# Patient Record
Sex: Female | Born: 1945 | Race: Black or African American | Hispanic: No | Smoking: Never smoker
Health system: Southern US, Community
[De-identification: ages and names within clinical notes are randomized; demographics above are authoritative.]

## PROBLEM LIST (undated history)

## (undated) DIAGNOSIS — K08109 Complete loss of teeth, unspecified cause, unspecified class: Secondary | ICD-10-CM

## (undated) DIAGNOSIS — Z973 Presence of spectacles and contact lenses: Secondary | ICD-10-CM

## (undated) DIAGNOSIS — R519 Headache, unspecified: Secondary | ICD-10-CM

## (undated) DIAGNOSIS — H409 Unspecified glaucoma: Secondary | ICD-10-CM

## (undated) DIAGNOSIS — M1990 Unspecified osteoarthritis, unspecified site: Secondary | ICD-10-CM

## (undated) DIAGNOSIS — Z9289 Personal history of other medical treatment: Secondary | ICD-10-CM

## (undated) DIAGNOSIS — I10 Essential (primary) hypertension: Secondary | ICD-10-CM

## (undated) DIAGNOSIS — G959 Disease of spinal cord, unspecified: Secondary | ICD-10-CM

## (undated) DIAGNOSIS — Z972 Presence of dental prosthetic device (complete) (partial): Secondary | ICD-10-CM

## (undated) DIAGNOSIS — D649 Anemia, unspecified: Secondary | ICD-10-CM

## (undated) DIAGNOSIS — D75839 Thrombocytosis, unspecified: Secondary | ICD-10-CM

## (undated) DIAGNOSIS — M5441 Lumbago with sciatica, right side: Secondary | ICD-10-CM

## (undated) DIAGNOSIS — K5792 Diverticulitis of intestine, part unspecified, without perforation or abscess without bleeding: Secondary | ICD-10-CM

## (undated) DIAGNOSIS — K922 Gastrointestinal hemorrhage, unspecified: Secondary | ICD-10-CM

## (undated) DIAGNOSIS — D751 Secondary polycythemia: Secondary | ICD-10-CM

## (undated) DIAGNOSIS — D473 Essential (hemorrhagic) thrombocythemia: Secondary | ICD-10-CM

## (undated) HISTORY — PX: TUBAL LIGATION: SHX77

## (undated) HISTORY — PX: CATARACT EXTRACTION, BILATERAL: SHX1313

## (undated) HISTORY — PX: ABDOMINAL HYSTERECTOMY: SHX81

## (undated) HISTORY — PX: FRACTURE SURGERY: SHX138

---
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
## 2011-08-10 ENCOUNTER — Encounter (INDEPENDENT_AMBULATORY_CARE_PROVIDER_SITE_OTHER): Payer: Self-pay | Admitting: Hematology and Oncology

## 2011-08-10 DIAGNOSIS — D45 Polycythemia vera: Secondary | ICD-10-CM

## 2011-08-10 DIAGNOSIS — K922 Gastrointestinal hemorrhage, unspecified: Secondary | ICD-10-CM

## 2011-09-02 ENCOUNTER — Encounter (INDEPENDENT_AMBULATORY_CARE_PROVIDER_SITE_OTHER): Payer: 59 | Admitting: Hematology and Oncology

## 2011-09-02 DIAGNOSIS — D45 Polycythemia vera: Secondary | ICD-10-CM

## 2011-09-02 DIAGNOSIS — D649 Anemia, unspecified: Secondary | ICD-10-CM

## 2011-09-02 DIAGNOSIS — K5730 Diverticulosis of large intestine without perforation or abscess without bleeding: Secondary | ICD-10-CM

## 2011-09-30 ENCOUNTER — Encounter (INDEPENDENT_AMBULATORY_CARE_PROVIDER_SITE_OTHER): Payer: 59 | Admitting: Hematology and Oncology

## 2011-09-30 DIAGNOSIS — I10 Essential (primary) hypertension: Secondary | ICD-10-CM

## 2011-09-30 DIAGNOSIS — D473 Essential (hemorrhagic) thrombocythemia: Secondary | ICD-10-CM

## 2011-09-30 DIAGNOSIS — D45 Polycythemia vera: Secondary | ICD-10-CM

## 2011-11-01 DIAGNOSIS — I959 Hypotension, unspecified: Secondary | ICD-10-CM

## 2011-11-01 DIAGNOSIS — K922 Gastrointestinal hemorrhage, unspecified: Secondary | ICD-10-CM

## 2011-11-23 ENCOUNTER — Encounter (INDEPENDENT_AMBULATORY_CARE_PROVIDER_SITE_OTHER): Payer: 59 | Admitting: Hematology and Oncology

## 2011-11-23 DIAGNOSIS — D696 Thrombocytopenia, unspecified: Secondary | ICD-10-CM

## 2012-07-27 DIAGNOSIS — K922 Gastrointestinal hemorrhage, unspecified: Secondary | ICD-10-CM

## 2012-07-27 DIAGNOSIS — I10 Essential (primary) hypertension: Secondary | ICD-10-CM

## 2012-07-27 DIAGNOSIS — D45 Polycythemia vera: Secondary | ICD-10-CM

## 2012-07-27 DIAGNOSIS — D473 Essential (hemorrhagic) thrombocythemia: Secondary | ICD-10-CM

## 2012-08-31 DIAGNOSIS — D473 Essential (hemorrhagic) thrombocythemia: Secondary | ICD-10-CM

## 2012-08-31 DIAGNOSIS — D45 Polycythemia vera: Secondary | ICD-10-CM

## 2012-08-31 DIAGNOSIS — I10 Essential (primary) hypertension: Secondary | ICD-10-CM

## 2013-07-18 ENCOUNTER — Encounter (HOSPITAL_COMMUNITY): Payer: Self-pay | Admitting: Pharmacy Technician

## 2013-07-26 NOTE — Patient Instructions (Signed)
Gabrielle Donovan  07/26/2013   Your procedure is scheduled on:  07/31/2013  0935am-1045am  Report to [facility] at    [phone]  AM.  Call this number if you have problems the morning of surgery: [phone]   Remember:   Do not eat food or drink liquids after midnight.   Take these medicines the morning of surgery with A SIP OF WATER:    Do not wear jewelry, make-up or nail polish.  Do not wear lotions, powders, or perfumes.   Do not shave 48 hours prior to surgery. .  Do not bring valuables to the hospital.  Contacts, dentures or bridgework may not be worn into surgery.  Leave suitcase in the car. After surgery it may be brought to your room.  For patients admitted to the hospital, checkout time is 11:00 AM the day of  discharge.   Gabrielle Donovan - Preparing for Surgery Before surgery, you can play an important role.  Because skin is not sterile, your skin needs to be as free of germs as possible.  You can reduce the number of germs on your skin by washing with CHG (chlorahexidine gluconate) soap before surgery.  CHG is an antiseptic cleaner which kills germs and bonds with the skin to continue killing germs even after washing. Please DO NOT use if you have an allergy to CHG or antibacterial soaps.  If your skin becomes reddened/irritated stop using the CHG and inform your nurse when you arrive at Short Stay. Do not shave (including legs and underarms) for at least 48 hours prior to the first CHG shower.  You may shave your face. Please follow these instructions carefully:  1.  Shower with CHG Soap the night before surgery and the  morning of Surgery.  2.  If you choose to wash your hair, wash your hair first as usual with your  normal  shampoo.  3.  After you shampoo, rinse your hair and body thoroughly to remove the  shampoo.                           4.  Use CHG as you would any other liquid soap.  You can apply chg directly  to the skin and wash                       Gently with  a scrungie or clean washcloth.  5.  Apply the CHG Soap to your body ONLY FROM THE NECK DOWN.   Do not use on open                           Wound or open sores. Avoid contact with eyes, ears mouth and genitals (private parts).                        Genitals (private parts) with your normal soap.             6.  Wash thoroughly, paying special attention to the area where your surgery  will be performed.  7.  Thoroughly rinse your body with warm water from the neck down.  8.  DO NOT shower/wash with your normal soap after using and rinsing off  the CHG Soap.                9.  Pat yourself dry with  a clean towel.            10.  Wear clean pajamas.            11.  Place clean sheets on your bed the night of your first shower and do not  sleep with pets. Day of Surgery : Do not apply any lotions/deodorants the morning of surgery.  Please wear clean clothes to the hospital/surgery center.  FAILURE TO FOLLOW THESE INSTRUCTIONS MAY RESULT IN THE CANCELLATION OF YOUR SURGERY PATIENT SIGNATURE_________________________________  NURSE SIGNATURE__________________________________  ________________________________________________________________________  WHAT IS A BLOOD TRANSFUSION? Blood Transfusion Information  A transfusion is the replacement of blood or some of its parts. Blood is made up of multiple cells which provide different functions.  Red blood cells carry oxygen and are used for blood loss replacement.  White blood cells fight against infection.  Platelets control bleeding.  Plasma helps clot blood.  Other blood products are available for specialized needs, such as hemophilia or other clotting disorders. BEFORE THE TRANSFUSION  Who gives blood for transfusions?   Healthy volunteers who are fully evaluated to make sure their blood is safe. This is blood bank blood. Transfusion therapy is the safest it has ever been in the practice of medicine. Before blood is taken from a donor, a  complete history is taken to make sure that person has no history of diseases nor engages in risky social behavior (examples are intravenous drug use or sexual activity with multiple partners). The donor's travel history is screened to minimize risk of transmitting infections, such as malaria. The donated blood is tested for signs of infectious diseases, such as HIV and hepatitis. The blood is then tested to be sure it is compatible with you in order to minimize the chance of a transfusion reaction. If you or a relative donates blood, this is often done in anticipation of surgery and is not appropriate for emergency situations. It takes many days to process the donated blood. RISKS AND COMPLICATIONS Although transfusion therapy is very safe and saves many lives, the main dangers of transfusion include:   Getting an infectious disease.  Developing a transfusion reaction. This is an allergic reaction to something in the blood you were given. Every precaution is taken to prevent this. The decision to have a blood transfusion has been considered carefully by your caregiver before blood is given. Blood is not given unless the benefits outweigh the risks. AFTER THE TRANSFUSION  Right after receiving a blood transfusion, you will usually feel much better and more energetic. This is especially true if your red blood cells have gotten low (anemic). The transfusion raises the level of the red blood cells which carry oxygen, and this usually causes an energy increase.  The nurse administering the transfusion will monitor you carefully for complications. HOME CARE INSTRUCTIONS  No special instructions are needed after a transfusion. You may find your energy is better. Speak with your caregiver about any limitations on activity for underlying diseases you may have. SEEK MEDICAL CARE IF:   Your condition is not improving after your transfusion.  You develop redness or irritation at the intravenous (IV)  site. SEEK IMMEDIATE MEDICAL CARE IF:  Any of the following symptoms occur over the next 12 hours:  Shaking chills.  You have a temperature by mouth above 102 F (38.9 C), not controlled by medicine.  Chest, back, or muscle pain.  People around you feel you are not acting correctly or are confused.  Shortness of breath  or difficulty breathing.  Dizziness and fainting.  You get a rash or develop hives.  You have a decrease in urine output.  Your urine turns a dark color or changes to pink, red, or brown. Any of the following symptoms occur over the next 10 days:  You have a temperature by mouth above 102 F (38.9 C), not controlled by medicine.  Shortness of breath.  Weakness after normal activity.  The white part of the eye turns yellow (jaundice).  You have a decrease in the amount of urine or are urinating less often.  Your urine turns a dark color or changes to pink, red, or brown. Document Released: 02/27/2000 Document Revised: 05/24/2011 Document Reviewed: 10/16/2007 Exit[employer] Patient Information 2014 [employer] [place].  _______________________________________________________________________  Incentive Spirometer  An incentive spirometer is a tool that can help keep your lungs clear and active. This tool measures how well you are filling your lungs with each breath. Taking long deep breaths may help reverse or decrease the chance of developing breathing (pulmonary) problems (especially infection) following:  A long period of time when you are unable to move or be active. BEFORE THE PROCEDURE   If the spirometer includes an indicator to show your best effort, your nurse or respiratory therapist will set it to a desired goal.  If possible, sit up straight or lean slightly forward. Try not to slouch.  Hold the incentive spirometer in an upright position. INSTRUCTIONS FOR USE  1. Sit on the edge of your bed if possible, or sit up as far as you can in bed or on a  chair. 2. Hold the incentive spirometer in an upright position. 3. Breathe out normally. 4. Place the mouthpiece in your mouth and seal your lips tightly around it. 5. Breathe in slowly and as deeply as possible, raising the piston or the ball toward the top of the column. 6. Hold your breath for 3-5 seconds or for as long as possible. Allow the piston or ball to fall to the bottom of the column. 7. Remove the mouthpiece from your mouth and breathe out normally. 8. Rest for a few seconds and repeat Steps 1 through 7 at least 10 times every 1-2 hours when you are awake. Take your time and take a few normal breaths between deep breaths. 9. The spirometer may include an indicator to show your best effort. Use the indicator as a goal to work toward during each repetition. 10. After each set of 10 deep breaths, practice coughing to be sure your lungs are clear. If you have an incision (the cut made at the time of surgery), support your incision when coughing by placing a pillow or rolled up towels firmly against it. Once you are able to get out of bed, walk around indoors and cough well. You may stop using the incentive spirometer when instructed by your caregiver.  RISKS AND COMPLICATIONS  Take your time so you do not get dizzy or light-headed.  If you are in pain, you may need to take or ask for pain medication before doing incentive spirometry. It is harder to take a deep breath if you are having pain. AFTER USE  Rest and breathe slowly and easily.  It can be helpful to keep track of a log of your progress. Your caregiver can provide you with a simple table to help with this. If you are using the spirometer at home, follow these instructions: SEEK MEDICAL CARE IF:   You are having difficultly using the  spirometer.  You have trouble using the spirometer as often as instructed.  Your pain medication is not giving enough relief while using the spirometer.  You develop fever of 100.5 F  (38.1 C) or higher. SEEK IMMEDIATE MEDICAL CARE IF:   You cough up bloody sputum that had not been present before.  You develop fever of 102 F (38.9 C) or greater.  You develop worsening pain at or near the incision site. MAKE SURE YOU:   Understand these instructions.  Will watch your condition.  Will get help right away if you are not doing well or get worse. Document Released: 07/12/2006 Document Revised: 05/24/2011 Document Reviewed: 09/12/2006 ExitCare Patient Information 2014 ExitCare, [place].   ________________________________________________________________________    Please read over the following fact sheets that you were given: MRSA Information, coughing and deep breathing exercises, leg exercises

## 2013-07-27 ENCOUNTER — Ambulatory Visit (HOSPITAL_COMMUNITY)
Admission: RE | Admit: 2013-07-27 | Discharge: 2013-07-27 | Disposition: A | Discharge status: Home / Self Care | Payer: PRIVATE HEALTH INSURANCE | Source: Ambulatory Visit | Attending: Orthopedic Surgery | Admitting: Orthopedic Surgery

## 2013-07-27 ENCOUNTER — Encounter (HOSPITAL_COMMUNITY)
Admission: RE | Admit: 2013-07-27 | Discharge: 2013-07-27 | Disposition: A | Discharge status: Home / Self Care | Payer: PRIVATE HEALTH INSURANCE | Source: Ambulatory Visit | Attending: Orthopedic Surgery | Admitting: Orthopedic Surgery

## 2013-07-27 ENCOUNTER — Encounter (HOSPITAL_COMMUNITY): Payer: Self-pay

## 2013-07-27 DIAGNOSIS — Z01812 Encounter for preprocedural laboratory examination: Secondary | ICD-10-CM | POA: Insufficient documentation

## 2013-07-27 DIAGNOSIS — G9589 Other specified diseases of spinal cord: Secondary | ICD-10-CM | POA: Insufficient documentation

## 2013-07-27 DIAGNOSIS — Z01818 Encounter for other preprocedural examination: Secondary | ICD-10-CM | POA: Insufficient documentation

## 2013-07-27 HISTORY — DX: Unspecified osteoarthritis, unspecified site: M19.90

## 2013-07-27 HISTORY — DX: Essential (primary) hypertension: I10

## 2013-07-27 LAB — BASIC METABOLIC PANEL
BUN: 12 mg/dL (ref 6–23)
CO2: 26 meq/L (ref 19–32)
Calcium: 9.9 mg/dL (ref 8.4–10.5)
Chloride: 104 mEq/L (ref 96–112)
Creatinine, Ser: 0.85 mg/dL (ref 0.50–1.10)
GFR calc Af Amer: 79 mL/min — ABNORMAL LOW (ref >90)
GFR, EST NON AFRICAN AMERICAN: 68 mL/min — AB (ref >90)
GLUCOSE: 81 mg/dL (ref 70–99)
POTASSIUM: 4.6 meq/L (ref 3.7–5.3)
Sodium: 141 mEq/L (ref 137–147)

## 2013-07-27 LAB — CBC
HEMATOCRIT: 29.5 % — AB (ref 36.0–46.0)
HEMOGLOBIN: 9.8 g/dL — AB (ref 12.0–15.0)
MCH: 39.4 pg — AB (ref 26.0–34.0)
MCHC: 33.2 g/dL (ref 30.0–36.0)
MCV: 118.5 fL — AB (ref 78.0–100.0)
Platelets: 1323 10*3/uL (ref 150–400)
RBC: 2.49 MIL/uL — ABNORMAL LOW (ref 3.87–5.11)
WBC: 3.3 10*3/uL — ABNORMAL LOW (ref 4.0–10.5)

## 2013-07-27 LAB — URINALYSIS, ROUTINE W REFLEX MICROSCOPIC
BILIRUBIN URINE: NEGATIVE
Glucose, UA: NEGATIVE mg/dL
Hgb urine dipstick: NEGATIVE
Ketones, ur: NEGATIVE mg/dL
LEUKOCYTES UA: NEGATIVE
NITRITE: NEGATIVE
PH: 7 (ref 5.0–8.0)
Protein, ur: NEGATIVE mg/dL
Specific Gravity, Urine: 1.01 (ref 1.005–1.030)
Urobilinogen, UA: 0.2 mg/dL (ref 0.0–1.0)

## 2013-07-27 LAB — SURGICAL PCR SCREEN
MRSA, PCR: NEGATIVE
Staphylococcus aureus: POSITIVE — AB

## 2013-07-27 LAB — ABO/RH: ABO/RH(D): A POS

## 2013-07-27 LAB — PROTIME-INR
INR: 1.12 (ref 0.00–1.49)
PROTHROMBIN TIME: 14.2 s (ref 11.6–15.2)

## 2013-07-27 LAB — APTT: aPTT: 38 seconds — ABNORMAL HIGH (ref 24–37)

## 2013-07-27 NOTE — Progress Notes (Addendum)
Stress  Test  07/20/2013 on chart  Baseline EKG from Stress Test 07/20/13 on chart

## 2013-07-27 NOTE — Progress Notes (Signed)
CBC results faxed via EPIC to Dr Judd Lien office.  Called office .  Office received labs.

## 2013-07-27 NOTE — Progress Notes (Signed)
Faxed via EPIC the CBC results on patient done today at preop visi to Dr Judd Lien.    Critical platelet count. Office received fax and spoke with Craig Staggers , RN and she saw  CBC results.  Craig Staggers stated Dr on call will be notified and Dr Pleas Koch will be made aware of on Monday.

## 2013-07-27 NOTE — Progress Notes (Signed)
CRITICAL VALUE ALERT  Critical value received:  Platelet count 1323  Date of notification:  07/27/2013   Time of notification:  1515pm  Critical value read back: YES   Nurse who received alert:  Gillian Shields RN   MD notified (1st page):  [phone]. Dr Alvan Dame   Time of first page:  1535pm  MD notified (2nd page):[phone] called office back and they stated Dr Alvan Dame had received page   Time of second page:Called office back at 1615pm to page MD on call- Dr Wynelle Link.  Dr Alvan Dame never returned page.    Responding MD:  Dr Wynelle Link returned page at 1630pm  Time MD responded:  1630pm- Dr Wynelle Link aware of platelet count of 1323 and Hemoglobin 9.8.  No new orders given except to notify Dr Alvan Dame on 07/30/13 am and Dr Wynelle Link aware [facility] aware of lab results and they will notify their MD on call.

## 2013-07-29 NOTE — H&P (Signed)
TOTAL HIP ADMISSION H&P  Patient is admitted for right total hip arthroplasty, anterior approach.  Subjective:  Chief Complaint:     Right hip OA / pain  HPI: Gabrielle Donovan, 68 y.o. female, has a history of pain and functional disability in the right hip(s) due to arthritis and patient has failed non-surgical conservative treatments for greater than 12 weeks to include NSAID's and/or analgesics, corticosteriod injections and activity modification.  Onset of symptoms was gradual starting years ago with gradually worsening course since that time.The patient noted no past surgery on the right hip(s).  Patient currently rates pain in the right hip at 10 out of 10 with activity. Patient has night pain, worsening of pain with activity and weight bearing, trendelenberg gait, pain that interfers with activities of daily living and pain with passive range of motion. Patient has evidence of periarticular osteophytes and joint space narrowing by imaging studies. This condition presents safety issues increasing the risk of falls.  There is no current active infection.  Risks, benefits and expectations were discussed with the patient.  Risks including but not limited to the risk of anesthesia, blood clots, nerve damage, blood vessel damage, failure of the prosthesis, infection and up to and including death.  Patient understand the risks, benefits and expectations and wishes to proceed with surgery.   D/C Plans:   Home with HHPT  Post-op Meds:     No Rx given  Tranexamic Acid:   To be given  Decadron:    To be given  FYI:    ASA post-op  Tramadol post-op    Past Medical History  Diagnosis Date  . Hypertension   . Arthritis     Past Surgical History  Procedure Laterality Date  . Abdominal hysterectomy      No prescriptions prior to admission   Allergies  Allergen Reactions  . Codeine Nausea And Vomiting  . Hydrocodone Nausea And Vomiting    History  Substance Use Topics  . Smoking status:  Never Smoker   . Smokeless tobacco: Never Used  . Alcohol Use: No    No family history on file.   Review of Systems  Constitutional: Negative.   HENT: Negative.   Eyes: Negative.   Respiratory: Negative.   Cardiovascular: Negative.   Gastrointestinal: Negative.   Genitourinary: Negative.   Musculoskeletal: Positive for joint pain.  Skin: Negative.   Neurological: Negative.   Endo/Heme/Allergies: Positive for environmental allergies.  Psychiatric/Behavioral: Negative.     Objective:  Physical Exam  Constitutional: She is oriented to person, place, and time. She appears well-developed and well-nourished.  HENT:  Head: Normocephalic and atraumatic.  Mouth/Throat: Oropharynx is clear and moist. She has dentures.  Eyes: Pupils are equal, round, and reactive to light.  Neck: Neck supple. No JVD present. No tracheal deviation present. No thyromegaly present.  Cardiovascular: Normal rate, regular rhythm, normal heart sounds and intact distal pulses.   Respiratory: Effort normal and breath sounds normal. No respiratory distress. She has no wheezes.  GI: Soft. There is no tenderness. There is no guarding.  Musculoskeletal:       Right hip: She exhibits decreased range of motion, decreased strength, tenderness and bony tenderness. She exhibits no swelling, no deformity and no laceration.  Lymphadenopathy:    She has no cervical adenopathy.  Neurological: She is alert and oriented to person, place, and time.  Skin: Skin is warm and dry.  Psychiatric: She has a normal mood and affect.     Imaging  Review Plain radiographs demonstrate severe degenerative joint disease of the right hip(s). The bone quality appears to be good for age and reported activity level.  Assessment/Plan:  End stage arthritis, right hip(s)  The patient history, physical examination, clinical judgement of the provider and imaging studies are consistent with end stage degenerative joint disease of the right  hip(s) and total hip arthroplasty is deemed medically necessary. The treatment options including medical management, injection therapy, arthroscopy and arthroplasty were discussed at length. The risks and benefits of total hip arthroplasty were presented and reviewed. The risks due to aseptic loosening, infection, stiffness, dislocation/subluxation,  thromboembolic complications and other imponderables were discussed.  The patient acknowledged the explanation, agreed to proceed with the plan and consent was signed. Patient is being admitted for inpatient treatment for surgery, pain control, PT, OT, prophylactic antibiotics, VTE prophylaxis, progressive ambulation and ADL's and discharge planning.The patient is planning to be discharged home with home health services.     West Pugh Adil Tugwell   PAC  07/29/2013, 6:57 PM

## 2013-07-30 LAB — PATHOLOGIST SMEAR REVIEW

## 2013-07-30 NOTE — Progress Notes (Signed)
Called OR.  Dr Alvan Dame not in [place] or OR lounge.  Spoke with Earnest Bailey will give message to Dr Alvan Dame and have him call me.  Called office and had Dr Alvan Dame paged.

## 2013-07-30 NOTE — Progress Notes (Signed)
Dr Alvan Dame returned page. Made Dr Alvan Dame aware that platelet count was 1323.  Smear in process per EPIC.  Also gave him name and phone number of PCP in [facility]- Dr Judd Lien- [phone].

## 2013-07-30 NOTE — Progress Notes (Signed)
Spoke with nurse at [facility] regarding abnormal labs done on 07/27/13.  They stated labs had been repeated and platelet count was in the 800s and patient had been okay  For  surgery per Dr Judd Lien .  They stated Dr Pleas Koch had spoken with Dr Alvan Dame.  They will fax results of labs done .

## 2013-07-30 NOTE — Progress Notes (Signed)
Repeat labs of CBC placed on front of chart from Dr Pleas Koch.

## 2013-07-31 ENCOUNTER — Inpatient Hospital Stay (HOSPITAL_COMMUNITY): Payer: PRIVATE HEALTH INSURANCE | Admitting: Anesthesiology

## 2013-07-31 ENCOUNTER — Inpatient Hospital Stay (HOSPITAL_COMMUNITY): Payer: PRIVATE HEALTH INSURANCE

## 2013-07-31 ENCOUNTER — Encounter (HOSPITAL_COMMUNITY)
Admission: RE | Disposition: A | Discharge status: Home Health | Payer: Self-pay | Source: Ambulatory Visit | Attending: Orthopedic Surgery

## 2013-07-31 ENCOUNTER — Inpatient Hospital Stay (HOSPITAL_COMMUNITY)
Admission: RE | Admit: 2013-07-31 | Discharge: 2013-08-02 | DRG: 470 | Disposition: A | Discharge status: Home Health | Payer: PRIVATE HEALTH INSURANCE | Source: Ambulatory Visit | Attending: Orthopedic Surgery | Admitting: Orthopedic Surgery

## 2013-07-31 ENCOUNTER — Encounter (HOSPITAL_COMMUNITY): Payer: PRIVATE HEALTH INSURANCE | Admitting: Anesthesiology

## 2013-07-31 ENCOUNTER — Encounter (HOSPITAL_COMMUNITY): Payer: Self-pay | Admitting: *Deleted

## 2013-07-31 DIAGNOSIS — Z6825 Body mass index (BMI) 25.0-25.9, adult: Secondary | ICD-10-CM

## 2013-07-31 DIAGNOSIS — Z96649 Presence of unspecified artificial hip joint: Secondary | ICD-10-CM

## 2013-07-31 DIAGNOSIS — I10 Essential (primary) hypertension: Secondary | ICD-10-CM | POA: Diagnosis present

## 2013-07-31 DIAGNOSIS — M169 Osteoarthritis of hip, unspecified: Principal | ICD-10-CM | POA: Diagnosis present

## 2013-07-31 DIAGNOSIS — D500 Iron deficiency anemia secondary to blood loss (chronic): Secondary | ICD-10-CM | POA: Diagnosis not present

## 2013-07-31 DIAGNOSIS — D62 Acute posthemorrhagic anemia: Secondary | ICD-10-CM | POA: Diagnosis not present

## 2013-07-31 DIAGNOSIS — E663 Overweight: Secondary | ICD-10-CM | POA: Diagnosis present

## 2013-07-31 DIAGNOSIS — M1610 Unilateral primary osteoarthritis, unspecified hip: Principal | ICD-10-CM | POA: Diagnosis present

## 2013-07-31 HISTORY — PX: TOTAL HIP ARTHROPLASTY: SHX124

## 2013-07-31 LAB — HEMOGLOBIN AND HEMATOCRIT, BLOOD
HCT: 26.3 % — ABNORMAL LOW (ref 36.0–46.0)
Hemoglobin: 8.8 g/dL — ABNORMAL LOW (ref 12.0–15.0)

## 2013-07-31 LAB — PREPARE RBC (CROSSMATCH)

## 2013-07-31 SURGERY — ARTHROPLASTY, HIP, TOTAL, ANTERIOR APPROACH
Anesthesia: Spinal | Site: Hip | Laterality: Right

## 2013-07-31 MED ORDER — MUPIROCIN 2 % EX OINT
1.0000 "application " | TOPICAL_OINTMENT | Freq: Two times a day (BID) | CUTANEOUS | Status: DC
  Administered 2013-07-31 – 2013-08-02 (×5): 1 via NASAL

## 2013-07-31 MED ORDER — TRANEXAMIC ACID 100 MG/ML IV SOLN
1000.0000 mg | Freq: Once | INTRAVENOUS | Status: AC
  Administered 2013-07-31: 1000 mg via INTRAVENOUS
  Filled 2013-07-31: qty 10

## 2013-07-31 MED ORDER — 0.9 % SODIUM CHLORIDE (POUR BTL) OPTIME
TOPICAL | Status: DC | PRN
  Administered 2013-07-31: 1000 mL

## 2013-07-31 MED ORDER — MAGNESIUM CITRATE PO SOLN: 1.0000 | Freq: Once | ORAL | Status: AC | PRN

## 2013-07-31 MED ORDER — CEFAZOLIN SODIUM-DEXTROSE 2-3 GM-% IV SOLR
INTRAVENOUS | Status: DC | PRN
  Administered 2013-07-31: 2 g via INTRAVENOUS

## 2013-07-31 MED ORDER — CELECOXIB 200 MG PO CAPS
200.0000 mg | ORAL_CAPSULE | Freq: Two times a day (BID) | ORAL | Status: DC
  Administered 2013-07-31 – 2013-08-02 (×3): 200 mg via ORAL
  Filled 2013-07-31 (×6): qty 1

## 2013-07-31 MED ORDER — DEXAMETHASONE SODIUM PHOSPHATE 10 MG/ML IJ SOLN
10.0000 mg | Freq: Once | INTRAMUSCULAR | Status: AC
  Administered 2013-08-01: 10 mg via INTRAVENOUS
  Filled 2013-07-31: qty 1

## 2013-07-31 MED ORDER — ONDANSETRON HCL 4 MG/2ML IJ SOLN
INTRAMUSCULAR | Status: AC
  Filled 2013-07-31: qty 2

## 2013-07-31 MED ORDER — HYDROMORPHONE HCL PF 1 MG/ML IJ SOLN
0.2500 mg | INTRAMUSCULAR | Status: DC | PRN
Start: 2013-07-31 — End: 2013-07-31
  Administered 2013-07-31: 0.5 mg via INTRAVENOUS

## 2013-07-31 MED ORDER — ONDANSETRON HCL 4 MG/2ML IJ SOLN
4.0000 mg | Freq: Four times a day (QID) | INTRAMUSCULAR | Status: DC | PRN
  Administered 2013-08-01: 4 mg via INTRAVENOUS
  Filled 2013-07-31: qty 2

## 2013-07-31 MED ORDER — CEFAZOLIN SODIUM-DEXTROSE 2-3 GM-% IV SOLR
2.0000 g | Freq: Four times a day (QID) | INTRAVENOUS | Status: AC
  Administered 2013-07-31 (×2): 2 g via INTRAVENOUS
  Filled 2013-07-31 (×2): qty 50

## 2013-07-31 MED ORDER — POTASSIUM CHLORIDE 2 MEQ/ML IV SOLN
100.0000 mL/h | INTRAVENOUS | Status: DC
  Administered 2013-07-31 – 2013-08-01 (×2): 100 mL/h via INTRAVENOUS
  Filled 2013-07-31 (×7): qty 1000

## 2013-07-31 MED ORDER — CEFAZOLIN SODIUM-DEXTROSE 2-3 GM-% IV SOLR
INTRAVENOUS | Status: AC
  Filled 2013-07-31: qty 50

## 2013-07-31 MED ORDER — CEFAZOLIN SODIUM-DEXTROSE 2-3 GM-% IV SOLR: 2.0000 g | INTRAVENOUS | Status: DC

## 2013-07-31 MED ORDER — FERROUS SULFATE 325 (65 FE) MG PO TABS
325.0000 mg | ORAL_TABLET | Freq: Three times a day (TID) | ORAL | Status: DC
  Administered 2013-07-31 – 2013-08-02 (×3): 325 mg via ORAL
  Filled 2013-07-31 (×8): qty 1

## 2013-07-31 MED ORDER — CHLORHEXIDINE GLUCONATE 4 % EX LIQD
60.0000 mL | Freq: Once | CUTANEOUS | Status: DC
Start: 2013-07-31 — End: 2013-07-31

## 2013-07-31 MED ORDER — POLYETHYLENE GLYCOL 3350 17 G PO PACK
17.0000 g | PACK | Freq: Two times a day (BID) | ORAL | Status: DC
  Administered 2013-07-31 – 2013-08-02 (×3): 17 g via ORAL

## 2013-07-31 MED ORDER — LACTATED RINGERS IV SOLN
INTRAVENOUS | Status: DC
Start: 2013-07-31 — End: 2013-07-31
  Administered 2013-07-31: 1000 mL via INTRAVENOUS
  Administered 2013-07-31: 11:00:00 via INTRAVENOUS

## 2013-07-31 MED ORDER — ONDANSETRON HCL 4 MG/2ML IJ SOLN
INTRAMUSCULAR | Status: DC | PRN
  Administered 2013-07-31: 4 mg via INTRAVENOUS

## 2013-07-31 MED ORDER — HYDROMORPHONE HCL PF 1 MG/ML IJ SOLN
INTRAMUSCULAR | Status: AC
  Filled 2013-07-31: qty 1

## 2013-07-31 MED ORDER — DIPHENHYDRAMINE HCL 25 MG PO CAPS: 25.0000 mg | ORAL_CAPSULE | Freq: Four times a day (QID) | ORAL | Status: DC | PRN

## 2013-07-31 MED ORDER — MIDAZOLAM HCL 5 MG/5ML IJ SOLN
INTRAMUSCULAR | Status: DC | PRN
  Administered 2013-07-31 (×2): 1 mg via INTRAVENOUS

## 2013-07-31 MED ORDER — BISACODYL 10 MG RE SUPP: 10.0000 mg | Freq: Every day | RECTAL | Status: DC | PRN

## 2013-07-31 MED ORDER — DEXAMETHASONE SODIUM PHOSPHATE 10 MG/ML IJ SOLN
10.0000 mg | Freq: Once | INTRAMUSCULAR | Status: AC
  Administered 2013-07-31: 10 mg via INTRAVENOUS

## 2013-07-31 MED ORDER — PROMETHAZINE HCL 25 MG/ML IJ SOLN
6.2500 mg | INTRAMUSCULAR | Status: DC | PRN
Start: 2013-07-31 — End: 2013-07-31

## 2013-07-31 MED ORDER — PHENOL 1.4 % MT LIQD
1.0000 | OROMUCOSAL | Status: DC | PRN
  Filled 2013-07-31: qty 177

## 2013-07-31 MED ORDER — METOCLOPRAMIDE HCL 5 MG/ML IJ SOLN: 5.0000 mg | Freq: Three times a day (TID) | INTRAMUSCULAR | Status: DC | PRN

## 2013-07-31 MED ORDER — DEXAMETHASONE SODIUM PHOSPHATE 10 MG/ML IJ SOLN
INTRAMUSCULAR | Status: AC
  Filled 2013-07-31: qty 1

## 2013-07-31 MED ORDER — METHOCARBAMOL 500 MG PO TABS: 500.0000 mg | ORAL_TABLET | Freq: Four times a day (QID) | ORAL | Status: DC | PRN

## 2013-07-31 MED ORDER — PROPOFOL 10 MG/ML IV BOLUS
INTRAVENOUS | Status: AC
  Filled 2013-07-31: qty 20

## 2013-07-31 MED ORDER — BUPIVACAINE IN DEXTROSE 0.75-8.25 % IT SOLN
INTRATHECAL | Status: DC | PRN
  Administered 2013-07-31: 1.8 mL via INTRATHECAL

## 2013-07-31 MED ORDER — FENTANYL CITRATE 0.05 MG/ML IJ SOLN
INTRAMUSCULAR | Status: DC | PRN
  Administered 2013-07-31: 50 ug via INTRAVENOUS

## 2013-07-31 MED ORDER — MIDAZOLAM HCL 2 MG/2ML IJ SOLN
INTRAMUSCULAR | Status: AC
  Filled 2013-07-31: qty 2

## 2013-07-31 MED ORDER — RIVAROXABAN 10 MG PO TABS
10.0000 mg | ORAL_TABLET | ORAL | Status: DC
  Administered 2013-08-01 – 2013-08-02 (×2): 10 mg via ORAL
  Filled 2013-07-31 (×4): qty 1

## 2013-07-31 MED ORDER — HYDROXYUREA 500 MG PO CAPS: 500.0000 mg | ORAL_CAPSULE | Freq: Two times a day (BID) | ORAL | Status: DC

## 2013-07-31 MED ORDER — FENTANYL CITRATE 0.05 MG/ML IJ SOLN
INTRAMUSCULAR | Status: AC
  Filled 2013-07-31: qty 2

## 2013-07-31 MED ORDER — METOCLOPRAMIDE HCL 10 MG PO TABS: 5.0000 mg | ORAL_TABLET | Freq: Three times a day (TID) | ORAL | Status: DC | PRN

## 2013-07-31 MED ORDER — METHOCARBAMOL 1000 MG/10ML IJ SOLN
500.0000 mg | Freq: Four times a day (QID) | INTRAVENOUS | Status: DC | PRN
  Filled 2013-07-31: qty 5

## 2013-07-31 MED ORDER — DOCUSATE SODIUM 100 MG PO CAPS
100.0000 mg | ORAL_CAPSULE | Freq: Two times a day (BID) | ORAL | Status: DC
  Administered 2013-07-31 – 2013-08-02 (×4): 100 mg via ORAL

## 2013-07-31 MED ORDER — TRAMADOL HCL 50 MG PO TABS
50.0000 mg | ORAL_TABLET | Freq: Four times a day (QID) | ORAL | Status: DC
  Administered 2013-07-31 – 2013-08-02 (×7): 100 mg via ORAL
  Filled 2013-07-31 (×8): qty 2

## 2013-07-31 MED ORDER — MENTHOL 3 MG MT LOZG
1.0000 | LOZENGE | OROMUCOSAL | Status: DC | PRN
  Filled 2013-07-31: qty 9

## 2013-07-31 MED ORDER — ALUM & MAG HYDROXIDE-SIMETH 200-200-20 MG/5ML PO SUSP: 30.0000 mL | ORAL | Status: DC | PRN

## 2013-07-31 MED ORDER — PROPOFOL INFUSION 10 MG/ML OPTIME
INTRAVENOUS | Status: DC | PRN
  Administered 2013-07-31: 50 ug/kg/min via INTRAVENOUS

## 2013-07-31 MED ORDER — ONDANSETRON HCL 4 MG PO TABS: 4.0000 mg | ORAL_TABLET | Freq: Four times a day (QID) | ORAL | Status: DC | PRN

## 2013-07-31 MED ORDER — HYDROMORPHONE HCL PF 1 MG/ML IJ SOLN
0.5000 mg | INTRAMUSCULAR | Status: DC | PRN
  Administered 2013-07-31 – 2013-08-01 (×3): 1 mg via INTRAVENOUS
  Filled 2013-07-31 (×3): qty 1

## 2013-07-31 SURGICAL SUPPLY — 37 items
BAG ZIPLOCK 12X15 (MISCELLANEOUS) IMPLANT
CAPT HIP PF MOP ×2 IMPLANT
COVER PERINEAL POST (MISCELLANEOUS) ×2 IMPLANT
DERMABOND ADVANCED (GAUZE/BANDAGES/DRESSINGS) ×1
DERMABOND ADVANCED .7 DNX12 (GAUZE/BANDAGES/DRESSINGS) ×1 IMPLANT
DRAPE C-ARM 42X120 X-RAY (DRAPES) ×2 IMPLANT
DRAPE STERI IOBAN 125X83 (DRAPES) ×2 IMPLANT
DRAPE U-SHAPE 47X51 STRL (DRAPES) ×6 IMPLANT
DRSG AQUACEL AG ADV 3.5X 6 (GAUZE/BANDAGES/DRESSINGS) ×2 IMPLANT
DRSG AQUACEL AG ADV 3.5X10 (GAUZE/BANDAGES/DRESSINGS) IMPLANT
DRSG TEGADERM 4X4.75 (GAUZE/BANDAGES/DRESSINGS) ×2 IMPLANT
DURAPREP 26ML APPLICATOR (WOUND CARE) ×2 IMPLANT
ELECT BLADE TIP CTD 4 INCH (ELECTRODE) ×2 IMPLANT
ELECT PENCIL ROCKER SW 15FT (MISCELLANEOUS) IMPLANT
ELECT REM PT RETURN 15FT ADLT (MISCELLANEOUS) IMPLANT
FACESHIELD WRAPAROUND (MASK) ×8 IMPLANT
GAUZE SPONGE 2X2 8PLY STRL LF (GAUZE/BANDAGES/DRESSINGS) ×1 IMPLANT
GLOVE BIOGEL PI IND STRL 7.5 (GLOVE) ×1 IMPLANT
GLOVE BIOGEL PI IND STRL 8 (GLOVE) ×1 IMPLANT
GLOVE BIOGEL PI INDICATOR 7.5 (GLOVE) ×1
GLOVE BIOGEL PI INDICATOR 8 (GLOVE) ×1
GLOVE ECLIPSE 8.0 STRL XLNG CF (GLOVE) ×2 IMPLANT
GLOVE ORTHO TXT STRL SZ7.5 (GLOVE) ×4 IMPLANT
GOWN SPEC L3 XXLG W/TWL (GOWN DISPOSABLE) ×2 IMPLANT
GOWN STRL REUS W/TWL LRG LVL3 (GOWN DISPOSABLE) ×2 IMPLANT
KIT BASIN OR (CUSTOM PROCEDURE TRAY) ×2 IMPLANT
PACK TOTAL JOINT (CUSTOM PROCEDURE TRAY) ×2 IMPLANT
SAW OSC TIP CART 19.5X105X1.3 (SAW) ×2 IMPLANT
SPONGE GAUZE 2X2 STER 10/PKG (GAUZE/BANDAGES/DRESSINGS) ×1
SUT MNCRL AB 4-0 PS2 18 (SUTURE) ×2 IMPLANT
SUT VIC AB 1 CT1 36 (SUTURE) ×6 IMPLANT
SUT VIC AB 2-0 CT1 27 (SUTURE) ×2
SUT VIC AB 2-0 CT1 TAPERPNT 27 (SUTURE) ×2 IMPLANT
SUT VLOC 180 0 24IN GS25 (SUTURE) ×2 IMPLANT
TOWEL OR 17X26 10 PK STRL BLUE (TOWEL DISPOSABLE) ×2 IMPLANT
TOWEL OR NON WOVEN STRL DISP B (DISPOSABLE) IMPLANT
TRAY FOLEY CATH 14FRSI W/METER (CATHETERS) ×2 IMPLANT

## 2013-07-31 NOTE — Anesthesia Preprocedure Evaluation (Addendum)
Anesthesia Evaluation  Patient identified by MRN, date of birth, ID band Patient awake    Reviewed: Allergy & Precautions, H&P , NPO status , Patient's Chart, lab work & pertinent test results  Airway Mallampati: II TM Distance: >3 FB Neck ROM: Full    Dental no notable dental hx.    Pulmonary neg pulmonary ROS,  breath sounds clear to auscultation  Pulmonary exam normal       Cardiovascular hypertension, Pt. on medications Rhythm:Regular Rate:Normal     Neuro/Psych negative neurological ROS  negative psych ROS   GI/Hepatic negative GI ROS, Neg liver ROS,   Endo/Other  negative endocrine ROS  Renal/GU negative Renal ROS  negative genitourinary   Musculoskeletal negative musculoskeletal ROS (+)   Abdominal   Peds negative pediatric ROS (+)  Hematology  (+) anemia , Unknown cause of anemia and increased platelet count.  ROS otherwise negative   Anesthesia Other Findings   Reproductive/Obstetrics negative OB ROS                         Anesthesia Physical Anesthesia Plan  ASA: III  Anesthesia Plan: Spinal   Post-op Pain Management:    Induction: Intravenous  Airway Management Planned: Simple Face Mask  Additional Equipment:   Intra-op Plan:   Post-operative Plan:   Informed Consent: I have reviewed the patients History and Physical, chart, labs and discussed the procedure including the risks, benefits and alternatives for the proposed anesthesia with the patient or authorized representative who has indicated his/her understanding and acceptance.     Plan Discussed with: CRNA and Surgeon  Anesthesia Plan Comments:         Anesthesia Quick Evaluation

## 2013-07-31 NOTE — Progress Notes (Signed)
X-ray results noted 

## 2013-07-31 NOTE — Anesthesia Procedure Notes (Signed)

## 2013-07-31 NOTE — Interval H&P Note (Signed)
History and Physical Interval Note:  07/31/2013 7:02 AM  Gabrielle Donovan  has presented today for surgery, with the diagnosis of OSTEOARTHRITIS RIGHT HIP  The various methods of treatment have been discussed with the patient and family. After consideration of risks, benefits and other options for treatment, the patient has consented to  Procedure(s): RIGHT TOTAL HIP ARTHROPLASTY ANTERIOR APPROACH (Right) as a surgical intervention .  The patient's history has been reviewed, patient examined, no change in status, stable for surgery.  I have reviewed the patient's chart and labs.  Questions were answered to the patient's satisfaction.     Mauri Pole

## 2013-07-31 NOTE — Progress Notes (Signed)
Hgb. And Hct. Drawn by lab. 

## 2013-07-31 NOTE — Anesthesia Postprocedure Evaluation (Signed)
  Anesthesia Post-op Note  Patient: Gabrielle Donovan  Procedure(s) Performed: Procedure(s) (LRB): RIGHT TOTAL HIP ARTHROPLASTY ANTERIOR APPROACH (Right)  Patient Location: PACU  Anesthesia Type: Spinal  Level of Consciousness: awake and alert   Airway and Oxygen Therapy: Patient Spontanous Breathing  Post-op Pain: mild  Post-op Assessment: Post-op Vital signs reviewed, Patient's Cardiovascular Status Stable, Respiratory Function Stable, Patent Airway and No signs of Nausea or vomiting  Last Vitals:  Filed Vitals:   07/31/13 1245  BP: 108/46  Pulse: 53  Temp: 36.4 C  Resp: 13    Post-op Vital Signs: stable   Complications: No apparent anesthesia complications

## 2013-07-31 NOTE — Progress Notes (Signed)
Portable AP Pelvis and Lateral Right Hip X-RAYS  Done.

## 2013-07-31 NOTE — Progress Notes (Signed)
Utilization review completed.  

## 2013-07-31 NOTE — Evaluation (Signed)
Physical Therapy Evaluation Patient Details Name: Gabrielle Donovan MRN: [id number] DOB: October 17, 1945 Today's Date: 07/31/2013   History of Present Illness  s/p R THA anterior approach  Clinical Impression  Pt s/p RTHA anterior approach presents with decreased mobility and would benefit from PT in order to continue to improve on mobility with ambulation, stair training and home exercise program for strengthening.     Follow Up Recommendations Home health PT    Equipment Recommendations  Rolling walker with 5" wheels    Recommendations for Other Services       Precautions / Restrictions Precautions Precautions: None Restrictions Weight Bearing Restrictions: No      Mobility  Bed Mobility Overal bed mobility: Needs Assistance Bed Mobility: Supine to Sit;Sit to Supine     Supine to sit: Mod assist;HOB elevated Sit to supine: +2 for physical assistance;Mod assist   General bed mobility comments: most assist due to pt very lethargic  Transfers Overall transfer level: Needs assistance Equipment used: Rolling walker (2 wheeled) Transfers: Sit to/from Stand Sit to Stand: Mod assist;+2 safety/equipment         General transfer comment: cues for hand placement and safety   Ambulation/Gait Ambulation/Gait assistance: +2 safety/equipment;Mod assist Ambulation Distance (Feet): 3 Feet Assistive device: Rolling walker (2 wheeled) Gait Pattern/deviations: Step-to pattern     General Gait Details: side stepping only to head of bed due to pt too lethagic to do any more ambulation   Stairs            Wheelchair Mobility    Modified Rankin (Stroke Patients Only)       Balance                                             Pertinent Vitals/Pain Pain un remarkable during session     Home Living Family/patient expects to be discharged to:: Private residence Living Arrangements: Alone Available Help at Discharge: Family;Available 24 hours/day Type  of Home: House Home Access: Stairs to enter Entrance Stairs-Rails: Right Entrance Stairs-Number of Steps: 3 Home Layout: One level Home Equipment: None      Prior Function Level of Independence: Independent         Comments: very active and independent, drives, part time job, etc.      Journalist, newspaper        Extremity/Trunk Assessment               Lower Extremity Assessment: RLE deficits/detail         Communication   Communication: No difficulties  Cognition Arousal/Alertness: Lethargic (lethargic due to pain meds per family) Behavior During Therapy: WFL for tasks assessed/performed (continuously had to stimulate to keep alert) Overall Cognitive Status: Within Functional Limits for tasks assessed                      General Comments      Exercises Total Joint Exercises Ankle Circles/Pumps: AROM;Both;10 reps;Supine Quad Sets: AROM;Right;Supine;10 reps Heel Slides: AAROM;Supine;Right;5 reps Hip ABduction/ADduction: AAROM;Supine;Right;5 reps      Assessment/Plan    PT Assessment Patient needs continued PT services  PT Diagnosis Difficulty walking   PT Problem List Decreased strength;Decreased activity tolerance;Decreased mobility;Decreased knowledge of use of DME  PT Treatment Interventions DME instruction;Gait training;Stair training;Functional mobility training;Therapeutic activities;Therapeutic exercise;Patient/family education   PT Goals (Current goals can be found in the Care  Plan section) Acute Rehab PT Goals Patient Stated Goal: to get home and waling again PT Goal Formulation: With patient/family Time For Goal Achievement: 08/14/13 Potential to Achieve Goals: Good    Frequency 7X/week   Barriers to discharge        Co-evaluation               End of Session   Activity Tolerance: Patient limited by lethargy Patient left: in bed;with call bell/phone within reach;with family/visitor present Nurse Communication: Mobility  status (reorted pt's lethargic state)         Time: 8185-9093 PT Time Calculation (min): 30 min   Charges:   PT Evaluation $Initial PT Evaluation Tier I: 1 Procedure PT Treatments $Therapeutic Exercise: 8-22 mins $Therapeutic Activity: 8-22 mins   PT G CodesClide Dales 2013-08-20, 7:02 PM Clide Dales, PT Pager: [phone] August 20, 2013

## 2013-07-31 NOTE — Progress Notes (Signed)
Dr. Kalman Shan made aware of patient's spinal level- O.K. To go to floor- made aware of patient's right shoulder discomfort- ; also HGB. AND Hct. Ordered.

## 2013-07-31 NOTE — Care Management Note (Addendum)
  Page 2 of 2   08/02/2013     11:46:03 AM CARE MANAGEMENT NOTE 08/02/2013  Patient:  Gabrielle Donovan, Gabrielle Donovan   Account Number:  [id number]  Date Initiated:  07/31/2013  Documentation initiated by:  [facility]  Subjective/Objective Assessment:   adm: R hip O/A pain; R TOTAL HIP ARTHROPLASTY, ANTERIOR APPROACH     Action/Plan:   discharge planning   Anticipated DC Date:  08/02/2013   Anticipated DC Plan:  [place]  CM consult      [facility] Choice  HOME HEALTH   Choice offered to / List presented to:  C-1 Patient   DME arranged  Vassie Moselle      DME agency  [place] arranged  [place]      [facility] agency  [facility]   Status of service:  Completed, signed off Medicare Important Message given?  YES (If response is "NO", the following Medicare IM given date fields will be blank) Date Medicare IM given:  07/27/2013 Date Additional Medicare IM given:    Discharge Disposition:  [place]  Per UR Regulation:    If discussed at Long Length of Stay Meetings, dates discussed:    Comments:  08/02/13 11:40 CM spoke with Jenny Reichmann of [facility] to notify of discharge and Cindy requested DC summary/last progress note which was faxed to [facility].  [facility] 08/03/13.  HHOT added on per OT recc. And faxed to [facility] at [facility]. No other CM needs were communicated. Mariane Masters, BSN, Jearld Lesch [phone].  08/01/13 07:35 CM received callback at 17:05 last evening from [facility] who accepts referral.  CM faxed facesheet, orders, F2F, PT eval note, OP note, and H&P to [facility] [phone]; plan for [facility] 08/03/13.  No other CM needs were communicated.  Mariane Masters, BSN, Jearld Lesch [phone].  07/31/13 15:55 CM met with pt in room to offer choice.  Pt chooses [facility] in [facility].  Pt has a 3n1 at home. Rolling walker will be delivered to room prior to discharge.  DME delivery rep aware of RW.  Referral called into [facility].  Wells Guiles  of [facility] will call back to accept or deny tomorrow.  Address and contact information verified with pt.  Waiting for callback before faxing orders.  Will continue to follow.  Mariane Masters, BSN, CM [phone].

## 2013-07-31 NOTE — Transfer of Care (Signed)
Immediate Anesthesia Transfer of Care Note  Patient: Gabrielle Donovan  Procedure(s) Performed: Procedure(s): RIGHT TOTAL HIP ARTHROPLASTY ANTERIOR APPROACH (Right)  Patient Location: PACU  Anesthesia Type:Spinal  Level of Consciousness: awake, sedated and patient cooperative  Airway & Oxygen Therapy: Patient Spontanous Breathing and Patient connected to face mask oxygen  Post-op Assessment: Report given to PACU RN and Post -op Vital signs reviewed and stable  Post vital signs: Reviewed and stable  Complications: No apparent anesthesia complications

## 2013-07-31 NOTE — Progress Notes (Signed)
6 floor R.N. To look for Hgb. And Hct. results

## 2013-07-31 NOTE — Op Note (Signed)
NAME:  Gabrielle Donovan NO.: [id number]      MEDICAL RECORD NO.: [id number]      FACILITY:  [facility]      PHYSICIAN:  Mauri Pole  DATE OF BIRTH:  May 16, 1945     DATE OF PROCEDURE:  07/31/2013                                 OPERATIVE REPORT         PREOPERATIVE DIAGNOSIS: Right  hip osteoarthritis.      POSTOPERATIVE DIAGNOSIS:  Right hip osteoarthritis.      PROCEDURE:  Right total hip replacement through an anterior approach   utilizing DePuy THR system, component size 35mm pinnacle cup, a size 32+4 neutral   Altrex liner, a size 4 Hi Tri Lock stem with a 32+1 Articuleze metal ball.      SURGEON:  Pietro Cassis. Alvan Dame, M.D.      ASSISTANT:  Danae Orleans, PA-C      ANESTHESIA:  Spinal.      SPECIMENS:  None.      COMPLICATIONS:  None.      BLOOD LOSS:  700 cc     DRAINS:  1 medium hemovac.      INDICATION OF THE PROCEDURE:  Gabrielle Donovan is a 68 y.o. female who had   presented to office for evaluation of right hip pain.  Radiographs revealed   progressive degenerative changes with bone-on-bone   articulation to the  hip joint.  The patient had painful limited range of   motion significantly affecting their overall quality of life.  The patient was failing to    respond to conservative measures, and at this point was ready   to proceed with more definitive measures.  The patient has noted progressive   degenerative changes in his hip, progressive problems and dysfunction   with regarding the hip prior to surgery.  Consent was obtained for   benefit of pain relief.  Specific risk of infection, DVT, component   failure, dislocation, need for revision surgery, as well discussion of   the anterior versus posterior approach were reviewed.  Consent was   obtained for benefit of anterior pain relief through an anterior   approach.      PROCEDURE IN DETAIL:  The patient was brought to operative theater.   Once adequate anesthesia,  preoperative antibiotics, 2gm Ancef administered.   The patient was positioned supine on the OSI Hanna table.  Once adequate   padding of boney process was carried out, we had predraped out the hip, and  used fluoroscopy to confirm orientation of the pelvis and position.      The right hip was then prepped and draped from proximal iliac crest to   mid thigh with shower curtain technique.      Time-out was performed identifying the patient, planned procedure, and   extremity.     An incision was then made 2 cm distal and lateral to the   anterior superior iliac spine extending over the orientation of the   tensor fascia lata muscle and sharp dissection was carried down to the   fascia of the muscle and protractor placed in the soft tissues.      The fascia was then incised.  The muscle belly was identified and  swept   laterally and retractor placed along the superior neck.  Following   cauterization of the circumflex vessels and removing some pericapsular   fat, a second cobra retractor was placed on the inferior neck.  A third   retractor was placed on the anterior acetabulum after elevating the   anterior rectus.  A L-capsulotomy was along the line of the   superior neck to the trochanteric fossa, then extended proximally and   distally.  Tag sutures were placed and the retractors were then placed   intracapsular.  We then identified the trochanteric fossa and   orientation of my neck cut, confirmed this radiographically   and then made a neck osteotomy with the femur on traction.  The femoral   head was removed without difficulty or complication.  Traction was let   off and retractors were placed posterior and anterior around the   acetabulum.      The labrum and foveal tissue were debrided.  I began reaming with a 49mm   reamer and reamed up to 79mm reamer with good bony bed preparation and a 50   cup was chosen.  The final 78mm Pinnacle cup was then impacted under fluoroscopy  to  confirm the depth of penetration and orientation with respect to   abduction.  A screw was placed followed by the hole eliminator.  The final   32+4 neutral Altrex liner was impacted with good visualized rim fit.  The cup was positioned anatomically within the acetabular portion of the pelvis.      At this point, the femur was rolled at 80 degrees.  Further capsule was   released off the inferior aspect of the femoral neck.  I then   released the superior capsule proximally.  The hook was placed laterally   along the femur and elevated manually and held in position with the bed   hook.  The leg was then extended and adducted with the leg rolled to 100   degrees of external rotation.  Once the proximal femur was fully   exposed, I used a box osteotome to set orientation.  I then began   broaching with the starting chili pepper broach and passed this by hand and then broached up to 4.  With the 4 broach in place I chose a high offset neck and did a trial reduction.  The offset was appropriate, leg lengths   appeared to be equal, confirmed radiographically.   Given these findings, I went ahead and dislocated the hip, repositioned all   retractors and positioned the right hip in the extended and abducted position.  The final 4 Hi Tri Lock stem was   chosen and it was impacted down to the level of neck cut.  Based on this   and the trial reduction, a 32+1 Articuleze metal ball was chosen and   impacted onto a clean and dry trunnion, and the hip was reduced.  The   hip had been irrigated throughout the case again at this point.  I did   reapproximate the superior capsular leaflet to the anterior leaflet   using #1 Vicryl, placed a medium Hemovac drain deep.  The fascia of the   tensor fascia lata muscle was then reapproximated using #1 Vicryl.  The   remaining wound was closed with 2-0 Vicryl and running 4-0 Monocryl.   The hip was cleaned, dried, and dressed sterilely using Dermabond and    Aquacel dressing.  Drain site dressed separately.  She was then brought   to recovery room in stable condition tolerating the procedure well.    Danae Orleans, PA-C was present for the entirety of the case involved from   preoperative positioning, perioperative retractor management, general   facilitation of the case, as well as primary wound closure as assistant.            Pietro Cassis Alvan Dame, M.D.        07/31/2013 11:22 AM

## 2013-08-01 DIAGNOSIS — D500 Iron deficiency anemia secondary to blood loss (chronic): Secondary | ICD-10-CM | POA: Diagnosis not present

## 2013-08-01 DIAGNOSIS — E663 Overweight: Secondary | ICD-10-CM | POA: Diagnosis present

## 2013-08-01 LAB — BASIC METABOLIC PANEL
BUN: 8 mg/dL (ref 6–23)
CALCIUM: 9.1 mg/dL (ref 8.4–10.5)
CO2: 23 mEq/L (ref 19–32)
CREATININE: 0.76 mg/dL (ref 0.50–1.10)
Chloride: 103 mEq/L (ref 96–112)
GFR, EST NON AFRICAN AMERICAN: 83 mL/min — AB (ref >90)
Glucose, Bld: 110 mg/dL — ABNORMAL HIGH (ref 70–99)
Potassium: 4.3 mEq/L (ref 3.7–5.3)
Sodium: 137 mEq/L (ref 137–147)

## 2013-08-01 LAB — CBC WITH DIFFERENTIAL/PLATELET
BASOS ABS: 0.1 10*3/uL (ref 0.0–0.1)
Basophils Relative: 1 % (ref 0–1)
Eosinophils Absolute: 0 10*3/uL (ref 0.0–0.7)
Eosinophils Relative: 0 % (ref 0–5)
HCT: 24.9 % — ABNORMAL LOW (ref 36.0–46.0)
Hemoglobin: 8.3 g/dL — ABNORMAL LOW (ref 12.0–15.0)
LYMPHS ABS: 1.1 10*3/uL (ref 0.7–4.0)
Lymphocytes Relative: 19 % (ref 12–46)
MCH: 41.1 pg — ABNORMAL HIGH (ref 26.0–34.0)
MCHC: 33.3 g/dL (ref 30.0–36.0)
MCV: 123.3 fL — ABNORMAL HIGH (ref 78.0–100.0)
MONOS PCT: 17 % — AB (ref 3–12)
Monocytes Absolute: 1 10*3/uL (ref 0.1–1.0)
Neutro Abs: 3.4 10*3/uL (ref 1.7–7.7)
Neutrophils Relative %: 63 % (ref 43–77)
PLATELETS: 903 10*3/uL — AB (ref 150–400)
RBC: 2.02 MIL/uL — ABNORMAL LOW (ref 3.87–5.11)
WBC: 5.6 10*3/uL (ref 4.0–10.5)

## 2013-08-01 MED ORDER — HYDROXYUREA 500 MG PO CAPS
1000.0000 mg | ORAL_CAPSULE | Freq: Every day | ORAL | Status: DC
  Administered 2013-08-02: 1000 mg via ORAL
  Filled 2013-08-01 (×2): qty 2

## 2013-08-01 MED ORDER — HYDROXYUREA 500 MG PO CAPS
500.0000 mg | ORAL_CAPSULE | Freq: Every day | ORAL | Status: DC
Start: 2013-08-01 — End: 2013-08-02
  Administered 2013-08-01: 500 mg via ORAL
  Filled 2013-08-01 (×2): qty 1

## 2013-08-01 NOTE — Evaluation (Signed)
Occupational Therapy Evaluation Patient Details Name: Gabrielle Donovan MRN: [id number] DOB: June 07, 1945 Today's Date: 08/01/2013    History of Present Illness s/p R THA anterior approach   Clinical Impression   Pt presents to OT with decreased I with ADL activity and will benefit from skilled OT to increase I with ADL activity and return to PLOF    Follow Up Recommendations  Home health OT    Equipment Recommendations  None recommended by OT       Precautions / Restrictions Precautions Precautions: None Restrictions Weight Bearing Restrictions: No      Mobility Bed Mobility Overal bed mobility: Needs Assistance Bed Mobility: Supine to Sit     Supine to sit: Min assist;Mod assist     General bed mobility comments: cues for sequence and use of L LE to self assist  Transfers Overall transfer level: Needs assistance Equipment used: Rolling walker (2 wheeled) Transfers: Sit to/from Stand Sit to Stand: Min assist;Mod assist         General transfer comment: cues for hand placement and safety          ADL Overall ADL's : Needs assistance/impaired     Grooming: Set up;Sitting   Upper Body Bathing: Supervision/ safety;Sitting   Lower Body Bathing: Minimal assistance;Sit to/from stand   Upper Body Dressing : Set up;Sitting   Lower Body Dressing: Minimal assistance;Sit to/from stand   Toilet Transfer: Minimal assistance   Toileting- Water quality scientist and Hygiene: Sit to/from stand       Functional mobility during ADLs: Minimal assistance General ADL Comments: grandson will help in initially         Communication Communication Communication: No difficulties   Cognition Arousal/Alertness: Awake/alert Behavior During Therapy: WFL for tasks assessed/performed Overall Cognitive Status: Within Functional Limits for tasks assessed                        Exercises Exercises: Total Joint          Home Living Family/patient expects to be  discharged to:: Private residence Living Arrangements: Alone Available Help at Discharge: Family;Available 24 hours/day Type of Home: House Home Access: Stairs to enter [employer] of Steps: 3 Entrance Stairs-Rails: Right Home Layout: One level               Home Equipment: None          Prior Functioning/Environment Level of Independence: Independent        Comments: very active and independent, drives, part time job, etc.     OT Diagnosis: Generalized weakness;Acute pain   OT Problem List: Decreased strength   OT Treatment/Interventions: Self-care/ADL training;DME and/or AE instruction;Patient/family education    OT Goals(Current goals can be found in the care plan section) Acute Rehab OT Goals Patient Stated Goal: get home and be able to do  OT Goal Formulation: With patient Time For Goal Achievement: 08/15/13 Potential to Achieve Goals: Good  OT Frequency: Min 2X/week    End of Session Nurse Communication: Mobility status  Activity Tolerance: Patient limited by fatigue Patient left: in chair;with call bell/phone within reach;with family/visitor present    Betsy Pries 08/01/2013, 1:03 PM

## 2013-08-01 NOTE — Progress Notes (Signed)
Physical Therapy Treatment Patient Details Name: Gabrielle Donovan MRN: [id number] DOB: 04/03/1945 Today's Date: Aug 08, 2013    History of Present Illness s/p R THA anterior approach    PT Comments    Pt more alert this date but still demonstrating ambulatory instability and mildly impulsive movements with questionable safety awareness.  Follow Up Recommendations  Home health PT     Equipment Recommendations  Rolling walker with 5" wheels    Recommendations for Other Services OT consult     Precautions / Restrictions Precautions Precautions: None Restrictions Weight Bearing Restrictions: No    Mobility  Bed Mobility Overal bed mobility: Needs Assistance Bed Mobility: Supine to Sit     Supine to sit: Min assist;Mod assist     General bed mobility comments: cues for sequence and use of L LE to self assist  Transfers Overall transfer level: Needs assistance Equipment used: Rolling walker (2 wheeled) Transfers: Sit to/from Stand Sit to Stand: Min assist;Mod assist         General transfer comment: cues for hand placement and safety   Ambulation/Gait Ambulation/Gait assistance: Min assist;Mod assist Ambulation Distance (Feet): 48 Feet Assistive device: Rolling walker (2 wheeled) Gait Pattern/deviations: Step-to pattern;Decreased step length - right;Decreased step length - left;Shuffle;Trunk flexed Gait velocity: decr   General Gait Details: cues for posture, sequence, stride length and position from [employer]            Wheelchair Mobility    Modified Rankin (Stroke Patients Only)       Balance                                    Cognition Arousal/Alertness: Awake/alert Behavior During Therapy: WFL for tasks assessed/performed Overall Cognitive Status: Within Functional Limits for tasks assessed                      Exercises Total Joint Exercises Ankle Circles/Pumps: AROM;Both;Supine;15 reps Quad Sets:  AROM;Right;Supine;10 reps Heel Slides: AAROM;Supine;Right;15 reps Hip ABduction/ADduction: AAROM;Supine;Right;15 reps    General Comments        Pertinent Vitals/Pain 5/10; premed, ice pack provided    Home Living                      Prior Function            PT Goals (current goals can now be found in the care plan section) Acute Rehab PT Goals Patient Stated Goal: to get home and waling again PT Goal Formulation: With patient/family Time For Goal Achievement: 08/14/13 Potential to Achieve Goals: Good Progress towards PT goals: Progressing toward goals    Frequency  7X/week    PT Plan Current plan remains appropriate    Co-evaluation             End of Session Equipment Utilized During Treatment: Gait belt Activity Tolerance: Patient tolerated treatment well Patient left: in chair;with call bell/phone within reach;with family/visitor present     Time: 0962-8366 PT Time Calculation (min): 32 min  Charges:  $Gait Training: 8-22 mins $Therapeutic Exercise: 8-22 mins                    G Codes:      Mathis Fare 08-Aug-2013, 12:47 PM

## 2013-08-01 NOTE — Progress Notes (Signed)
Foley left in place as patients Hgb this am is 8.3. Pt has been lethargic with much pain requiring IV pain medications. Will make am RN aware

## 2013-08-01 NOTE — Progress Notes (Signed)
Physical Therapy Treatment Patient Details Name: Gabrielle Donovan MRN: [id number] DOB: [birth year] Today's Date: Aug 31, 2013    History of Present Illness s/p R THA anterior approach    PT Comments    Pt progressing with noted improvement in ambulatory stability and activity tolerance  Follow Up Recommendations  Home health PT     Equipment Recommendations  Rolling walker with 5" wheels    Recommendations for Other Services OT consult     Precautions / Restrictions Precautions Precautions: Fall Restrictions Weight Bearing Restrictions: No Other Position/Activity Restrictions: WBAT    Mobility  Bed Mobility Overal bed mobility: Needs Assistance Bed Mobility: Sit to Supine     Supine to sit: Min assist;Mod assist Sit to supine: Mod assist   General bed mobility comments: cues for sequence and use of L LE to self assist  Transfers Overall transfer level: Needs assistance Equipment used: Rolling walker (2 wheeled) Transfers: Sit to/from Stand Sit to Stand: Min assist         General transfer comment: cues for hand placement and safety   Ambulation/Gait Ambulation/Gait assistance: Min assist Ambulation Distance (Feet): 125 Feet Assistive device: Rolling walker (2 wheeled) Gait Pattern/deviations: Step-to pattern;Decreased step length - right;Decreased step length - left;Shuffle;Trunk flexed Gait velocity: decr   General Gait Details: cues for posture, sequence, stride length, to increase ER on R and position from [employer] Mobility    Modified Rankin (Stroke Patients Only)       Balance                                    Cognition Arousal/Alertness: Awake/alert Behavior During Therapy: WFL for tasks assessed/performed Overall Cognitive Status: Within Functional Limits for tasks assessed                      Exercises      General Comments        Pertinent Vitals/Pain 5/10; meds requested;  ice packs declined    Home Living Family/patient expects to be discharged to:: Private residence Living Arrangements: Alone Available Help at Discharge: Family;Available 24 hours/day Type of Home: House Home Access: Stairs to enter Entrance Stairs-Rails: Right Home Layout: One level Home Equipment: None      Prior Function Level of Independence: Independent      Comments: very active and independent, drives, part time job, etc.    PT Goals (current goals can now be found in the care plan section) Acute Rehab PT Goals Patient Stated Goal: get home and be able to do  PT Goal Formulation: With patient/family Time For Goal Achievement: 08/14/13 Potential to Achieve Goals: Good Progress towards PT goals: Progressing toward goals    Frequency  7X/week    PT Plan Current plan remains appropriate    Co-evaluation             End of Session Equipment Utilized During Treatment: Gait belt Activity Tolerance: Patient tolerated treatment well Patient left: with call bell/phone within reach;with family/visitor present;in bed     Time: 2703-5009 PT Time Calculation (min): 24 min  Charges:  $Gait Training: 23-37 mins                    G Codes:      Mathis Fare 31-Aug-2013, 4:46 PM

## 2013-08-01 NOTE — Progress Notes (Signed)
   Subjective: 1 Day Post-Op Procedure(s) (LRB): RIGHT TOTAL HIP ARTHROPLASTY ANTERIOR APPROACH (Right)   Patient reports pain as moderate, pain controlled. No events throughout the night. Denies any CP or SOB.   Objective:   VITALS:   Filed Vitals:   08/01/13 0607  BP: 107/65  Pulse: 71  Temp: 98.4 F (36.9 C)  Resp: 16    Neurovascular intact Dorsiflexion/Plantar flexion intact Incision: dressing C/D/I No cellulitis present Compartment soft  LABS  Recent Labs  07/31/13 1306 08/01/13 0502  HGB 8.8* 8.3*  HCT 26.3* 24.9*  WBC  --  5.6  PLT  --  903*     Recent Labs  08/01/13 0502  NA 137  K 4.3  BUN 8  CREATININE 0.76  GLUCOSE 110*     Assessment/Plan: 1 Day Post-Op Procedure(s) (LRB): RIGHT TOTAL HIP ARTHROPLASTY ANTERIOR APPROACH (Right) Foley cath d/c'ed HV drain d/c'ed Advance diet Up with therapy D/C IV fluids Discharge home with home health eventually, when ready  Expected ABLA  Treated with iron and will observe  Overweight (BMI 25-29.9) Estimated body mass index is 25.84 kg/(m^2) as calculated from the following:   Height as of this encounter: 5\' 6"  (1.676 m).   Weight as of this encounter: 72.576 kg (160 lb). Patient also counseled that weight may inhibit the healing process Patient counseled that losing weight will help with future health issues     West Pugh. Donnis Pecha   PAC  08/01/2013, 8:20 AM

## 2013-08-01 NOTE — Discharge Instructions (Signed)
Information on my medicine - XARELTO® (Rivaroxaban) ° °This medication education was reviewed with me or my healthcare representative as part of my discharge preparation.  The pharmacist that spoke with me during my hospital stay was:  Dymond Spreen S Marithza Malachi, RPH ° °Why was Xarelto® prescribed for you? °Xarelto® was prescribed for you to reduce the risk of blood clots forming after orthopedic surgery. The medical term for these abnormal blood clots is venous thromboembolism (VTE). ° °What do you need to know about xarelto® ? °Take your Xarelto® ONCE DAILY at the same time every day. °You may take it either with or without food. ° °If you have difficulty swallowing the tablet whole, you may crush it and mix in applesauce just prior to taking your dose. ° °Take Xarelto® exactly as prescribed by your doctor and DO NOT stop taking Xarelto® without talking to the doctor who prescribed the medication.  Stopping without other VTE prevention medication to take the place of Xarelto® may increase your risk of developing a clot. ° °After discharge, you should have regular check-up appointments with your healthcare provider that is prescribing your Xarelto®.   ° °What do you do if you miss a dose? °If you miss a dose, take it as soon as you remember on the same day then continue your regularly scheduled once daily regimen the next day. Do not take two doses of Xarelto® on the same day.  ° °Important Safety Information °A possible side effect of Xarelto® is bleeding. You should call your healthcare provider right away if you experience any of the following: °  Bleeding from an injury or your nose that does not stop. °  Unusual colored urine (red or dark brown) or unusual colored stools (red or black). °  Unusual bruising for unknown reasons. °  A serious fall or if you hit your head (even if there is no bleeding). ° °Some medicines may interact with Xarelto® and might increase your risk of bleeding while on Xarelto®. To help avoid this,  consult your healthcare provider or pharmacist prior to using any new prescription or non-prescription medications, including herbals, vitamins, non-steroidal anti-inflammatory drugs (NSAIDs) and supplements. ° °This website has more information on Xarelto®: www.xarelto.com. ° ° °

## 2013-08-02 LAB — BASIC METABOLIC PANEL
BUN: 9 mg/dL (ref 6–23)
CO2: 25 mEq/L (ref 19–32)
Calcium: 9.1 mg/dL (ref 8.4–10.5)
Chloride: 104 mEq/L (ref 96–112)
Creatinine, Ser: 0.95 mg/dL (ref 0.50–1.10)
GFR calc Af Amer: 69 mL/min — ABNORMAL LOW (ref >90)
GFR, EST NON AFRICAN AMERICAN: 59 mL/min — AB (ref >90)
Glucose, Bld: 90 mg/dL (ref 70–99)
Potassium: 4.7 mEq/L (ref 3.7–5.3)
Sodium: 139 mEq/L (ref 137–147)

## 2013-08-02 MED ORDER — POLYETHYLENE GLYCOL 3350 17 G PO PACK: 17.0000 g | PACK | Freq: Two times a day (BID) | ORAL | Status: DC

## 2013-08-02 MED ORDER — FERROUS SULFATE 325 (65 FE) MG PO TABS: 325.0000 mg | ORAL_TABLET | Freq: Three times a day (TID) | ORAL | Status: DC

## 2013-08-02 MED ORDER — RIVAROXABAN 10 MG PO TABS: 10.0000 mg | ORAL_TABLET | ORAL | Status: DC

## 2013-08-02 MED ORDER — TIZANIDINE HCL 4 MG PO TABS: 4.0000 mg | ORAL_TABLET | Freq: Four times a day (QID) | ORAL | Status: DC | PRN

## 2013-08-02 MED ORDER — TRAMADOL HCL 50 MG PO TABS: 50.0000 mg | ORAL_TABLET | Freq: Four times a day (QID) | ORAL | Status: DC | PRN

## 2013-08-02 MED ORDER — DSS 100 MG PO CAPS: 100.0000 mg | ORAL_CAPSULE | Freq: Two times a day (BID) | ORAL | Status: DC

## 2013-08-02 NOTE — Progress Notes (Signed)
Pt to d/c home with [facility]. AVS reviewed and "My Chart" discussed with pt. Pt capable of verbalizing medications, dressing changes, signs and symptoms of infection, and follow-up appointments. Remains hemodynamically stable. No signs and symptoms of distress. Educated pt to return to ER in the case of SOB, dizziness, or chest pain.

## 2013-08-02 NOTE — Progress Notes (Signed)
Occupational Therapy Treatment Patient Details Name: Gabrielle Donovan MRN: [id number] DOB: 1945/06/29 Today's Date: 08/02/2013    History of present illness s/p R THA anterior approach   OT comments  Pt needs min cues for safety as she moves quickly.  Independent nature.  Follow Up Recommendations  Home health OT    Equipment Recommendations  None recommended by OT    Recommendations for Other Services      Precautions / Restrictions Precautions Precautions: Fall Restrictions Other Position/Activity Restrictions: WBAT       Mobility Bed Mobility                  Transfers       Sit to Stand: Min guard         General transfer comment: cues for safety; hand placement    Balance                                   ADL Overall ADL's : Needs assistance/impaired     Grooming: Oral care;Standing;Min guard               Lower Body Dressing: With adaptive equipment;Sit to/from stand;Min guard           Tub/ Shower Transfer: Walk-in shower;Min guard;Ambulation   Functional mobility during ADLs: Min guard;Rolling walker General ADL Comments: pt tends to move very quickly.  Cued to slow down before attempting to pull pants up.  Used reacher for pants.  Had one balance check standing at sink but no LOB.        Vision                     Perception     Praxis      Cognition   Behavior During Therapy: WFL for tasks assessed/performed Overall Cognitive Status: Within Functional Limits for tasks assessed                       Extremity/Trunk Assessment               Exercises     Shoulder Instructions       General Comments      Pertinent Vitals/ Pain       No c/o pain  Home Living                                          Prior Functioning/Environment              Frequency Min 2X/week     Progress Toward Goals  OT Goals(current goals can now be found in the care plan  section)  Progress towards OT goals: Progressing toward goals     Plan      Co-evaluation                 End of Session     Activity Tolerance Patient tolerated treatment well   Patient Left in chair;with call bell/phone within reach   Nurse Communication          Time: 4268-3419 OT Time Calculation (min): 16 min  Charges: OT General Charges $OT Visit: 1 Procedure OT Treatments $Self Care/Home Management : 8-22 mins  [facility] 08/02/2013, 8:09 AM Lesle Chris, OTR/L [phone] 08/02/2013

## 2013-08-02 NOTE — Progress Notes (Signed)
Discharge summary sent to payer through [facility]  

## 2013-08-02 NOTE — Progress Notes (Signed)
   Subjective: 2 Days Post-Op Procedure(s) (LRB): RIGHT TOTAL HIP ARTHROPLASTY ANTERIOR APPROACH (Right)   Patient reports pain as mild, pain controlled. No events throughout the night. Feels that she is doing well and ready to go home.  Objective:   VITALS:   Filed Vitals:   08/02/13   BP: 116/63  Pulse: 78  Temp: 97.9 F (36.6 C)   Resp: 18    Neurovascular intact Dorsiflexion/Plantar flexion intact Incision: dressing C/D/I No cellulitis present Compartment soft  LABS  Recent Labs  07/31/13 1306 08/01/13 0502  HGB 8.8* 8.3*  HCT 26.3* 24.9*  WBC  --  5.6  PLT  --  903*     Recent Labs  08/01/13 0502 08/02/13 0456  NA 137 139  K 4.3 4.7  BUN 8 9  CREATININE 0.76 0.95  GLUCOSE 110* 90     Assessment/Plan: 2 Days Post-Op Procedure(s) (LRB): RIGHT TOTAL HIP ARTHROPLASTY ANTERIOR APPROACH (Right) Up with therapy Discharge home with home health Follow up in 2 weeks at [facility]. Follow up with OLIN,Graycee Greeson D in 2 weeks.  Contact information:  [facility] [address], [place] [id number]    Expected ABLA  Treated with iron and will observe   Overweight (BMI 25-29.9)  Estimated body mass index is 25.84 kg/(m^2) as calculated from the following:      Height as of this encounter: 5\' 6"  (1.676 m).      Weight as of this encounter: 72.576 kg (160 lb).  Patient also counseled that weight may inhibit the healing process  Patient counseled that losing weight will help with future health issues         West Pugh. Tynesia Harral   PAC  08/02/2013, 9:11 AM

## 2013-08-02 NOTE — Progress Notes (Signed)
Physical Therapy Treatment Patient Details Name: Gabrielle Donovan MRN: [id number] DOB: [birth year] Today's Date: 08/02/2013    History of Present Illness s/p R THA anterior approach    PT Comments    POD # 2 pt ready to D/C to home.  Amb in hallway then practiced stairs.  Follow Up Recommendations  Home health PT     Equipment Recommendations  Rolling walker with 5" wheels    Recommendations for Other Services       Precautions / Restrictions Precautions Precautions: Fall Restrictions Weight Bearing Restrictions: No Other Position/Activity Restrictions: WBAT    Mobility  Bed Mobility               General bed mobility comments: Pt OOB in recliner  Transfers Overall transfer level: Needs assistance Equipment used: Rolling walker (2 wheeled) Transfers: Sit to/from Stand Sit to Stand: Min guard         General transfer comment: cues for safety; hand placement  Ambulation/Gait Ambulation/Gait assistance: Min assist Ambulation Distance (Feet): 145 Feet Assistive device: Rolling walker (2 wheeled) Gait Pattern/deviations: Step-to pattern Gait velocity: decr   General Gait Details: cues for posture, sequence, stride length, to increase ER on R and position from RW   Stairs Stairs: Yes Stairs assistance: Min guard Stair Management: One rail Right;Step to pattern;Forwards Number of Stairs: 2 General stair comments: 25% VC's on proper sequencing and safety  Wheelchair Mobility    Modified Rankin (Stroke Patients Only)       Balance                                    Cognition                            Exercises      General Comments        Pertinent Vitals/Pain C/o sorness ICE applied    Home Living                      Prior Function            PT Goals (current goals can now be found in the care plan section) Progress towards PT goals: Progressing toward goals    Frequency  7X/week    PT  Plan      Co-evaluation             End of Session Equipment Utilized During Treatment: Gait belt Activity Tolerance: Patient tolerated treatment well Patient left: with call bell/phone within reach;with family/visitor present;in chair     Time: 0623-7628 PT Time Calculation (min): 25 min  Charges:  $Gait Training: 8-22 mins $Therapeutic Activity: 8-22 mins                    G Codes:      Gabrielle Donovan  PTA [facility]  Acute  Rehab Pager      [phone]

## 2013-08-03 NOTE — Discharge Summary (Signed)
Physician Discharge Summary  Patient ID: Gabrielle Donovan MRN: [id number] DOB/AGE: [birth year] 70 y.o.  Admit date: 07/31/2013 Discharge date: 08/02/2013   Procedures:  Procedure(s) (LRB): RIGHT TOTAL HIP ARTHROPLASTY ANTERIOR APPROACH (Right)  Attending Physician:  Dr. Paralee Cancel   Admission Diagnoses:   Right hip OA / pain  Discharge Diagnoses:  Principal Problem:   S/P right THA, AA Active Problems:   Expected blood loss anemia   Overweight (BMI 25.0-29.9)  Past Medical History  Diagnosis Date  . Hypertension   . Arthritis     HPI: Gabrielle Donovan, 68 y.o. female, has a history of pain and functional disability in the right hip(s) due to arthritis and patient has failed non-surgical conservative treatments for greater than 12 weeks to include NSAID's and/or analgesics, corticosteriod injections and activity modification. Onset of symptoms was gradual starting years ago with gradually worsening course since that time.The patient noted no past surgery on the right hip(s). Patient currently rates pain in the right hip at 10 out of 10 with activity. Patient has night pain, worsening of pain with activity and weight bearing, trendelenberg gait, pain that interfers with activities of daily living and pain with passive range of motion. Patient has evidence of periarticular osteophytes and joint space narrowing by imaging studies. This condition presents safety issues increasing the risk of falls. There is no current active infection. Risks, benefits and expectations were discussed with the patient. Risks including but not limited to the risk of anesthesia, blood clots, nerve damage, blood vessel damage, failure of the prosthesis, infection and up to and including death. Patient understand the risks, benefits and expectations and wishes to proceed with surgery.  PCP: No primary provider on file.   Discharged Condition: good  Hospital Course:  Patient underwent the above stated procedure on  07/31/2013. Patient tolerated the procedure well and brought to the recovery room in good condition and subsequently to the floor.  POD #1 BP: 107/65 ; Pulse: 71 ; Temp: 98.4 F (36.9 C) ; Resp: 16  Patient reports pain as moderate, pain controlled. No events throughout the night. Denies any CP or SOB.  Neurovascular intact, dorsiflexion/plantar flexion intact, incision: dressing C/D/I, no cellulitis present and compartment soft.   LABS  Basename    HGB  8.3  HCT  24.9   POD #2  BP: 116/63 ; Pulse: 78 ; Temp: 97.9 F (36.6 C) ; Resp: 18  Patient reports pain as mild, pain controlled. No events throughout the night. Feels that she is doing well and ready to go home. Neurovascular intact, dorsiflexion/plantar flexion intact, incision: dressing C/D/I, no cellulitis present and compartment soft.   LABS   No new labs  Discharge Exam: General appearance: alert, cooperative and no distress Extremities: Homans sign is negative, no sign of DVT, no edema, redness or tenderness in the calves or thighs and no ulcers, gangrene or trophic changes  Disposition:   Home with follow up in 2 weeks   Follow-up Information   Follow up with [facility]. (home health physical and occupational therapy)    Contact information:   [phone]      Follow up with Mauri Pole, MD. Schedule an appointment as soon as possible for a visit in 2 weeks.   Specialty:  Orthopedic Surgery   Contact information:   [address] [place] [address] [phone]       Discharge Instructions   Call MD / Call 911    Complete by:  As directed   If you experience chest pain or shortness of breath, CALL 911 and be transported to the hospital emergency room.  If you develope a fever above 101 F, pus (white drainage) or increased drainage or redness at the wound, or calf pain, call your surgeon's office.     Change dressing    Complete by:  As directed   Maintain surgical dressing for  10-14 days, or until follow up in the clinic.     Constipation Prevention    Complete by:  As directed   Drink plenty of fluids.  Prune juice may be helpful.  You may use a stool softener, such as Colace (over the counter) 100 mg twice a day.  Use MiraLax (over the counter) for constipation as needed.     Diet - low sodium heart healthy    Complete by:  As directed      Discharge instructions    Complete by:  As directed   Maintain surgical dressing for 10-14 days, or until follow up in the clinic. Follow up in 2 weeks at [facility]. Call with any questions or concerns.     Driving restrictions    Complete by:  As directed   No driving for 4 weeks     Increase activity slowly as tolerated    Complete by:  As directed      TED hose    Complete by:  As directed   Use stockings (TED hose) for 2 weeks on both leg(s).  You may remove them at night for sleeping.     Weight bearing as tolerated    Complete by:  As directed              Medication List    STOP taking these medications       aspirin EC 81 MG tablet      TAKE these medications       DSS 100 MG Caps  Take 100 mg by mouth 2 (two) times daily.     ferrous sulfate 325 (65 FE) MG tablet  Take 1 tablet (325 mg total) by mouth 3 (three) times daily after meals.     folic acid 1 MG tablet  Commonly known as:  FOLVITE  Take 1 mg by mouth daily.     hydroxyurea 500 MG capsule  Commonly known as:  HYDREA  Take 500-1,000 mg by mouth 2 (two) times daily. Takes 2 in the morning and 1 in the evening     lisinopril 10 MG tablet  Commonly known as:  PRINIVIL,ZESTRIL  Take 10 mg by mouth every morning.     multivitamin with minerals Tabs tablet  Take 1 tablet by mouth daily.     mupirocin ointment 2 %  Commonly known as:  BACTROBAN  Place 1 application into the nose 2 (two) times daily.     polyethylene glycol packet  Commonly known as:  MIRALAX / GLYCOLAX  Take 17 g by mouth 2 (two) times daily.      rivaroxaban 10 MG Tabs tablet  Commonly known as:  XARELTO  Take 1 tablet (10 mg total) by mouth daily.     tiZANidine 4 MG tablet  Commonly known as:  ZANAFLEX  Take 1 tablet (4 mg total) by mouth every 6 (six) hours as needed for muscle spasms.     traMADol 50 MG tablet  Commonly known as:  ULTRAM  Take 1-2 tablets (50-100 mg total) by mouth every 6 (  six) hours as needed.         Signed: West Pugh. Rhonda Linan   PAC  08/03/2013, 12:36 PM

## 2013-08-04 LAB — TYPE AND SCREEN
ABO/RH(D): A POS
ANTIBODY SCREEN: NEGATIVE
UNIT DIVISION: 0
Unit division: 0
Unit division: 0

## 2015-04-03 DIAGNOSIS — D473 Essential (hemorrhagic) thrombocythemia: Secondary | ICD-10-CM | POA: Diagnosis not present

## 2015-04-03 DIAGNOSIS — D45 Polycythemia vera: Secondary | ICD-10-CM | POA: Diagnosis not present

## 2015-04-24 DIAGNOSIS — H401112 Primary open-angle glaucoma, right eye, moderate stage: Secondary | ICD-10-CM | POA: Diagnosis not present

## 2015-06-20 DIAGNOSIS — Z7689 Persons encountering health services in other specified circumstances: Secondary | ICD-10-CM | POA: Diagnosis not present

## 2015-06-20 DIAGNOSIS — D473 Essential (hemorrhagic) thrombocythemia: Secondary | ICD-10-CM | POA: Diagnosis not present

## 2015-06-26 DIAGNOSIS — D473 Essential (hemorrhagic) thrombocythemia: Secondary | ICD-10-CM | POA: Diagnosis not present

## 2015-06-26 DIAGNOSIS — I10 Essential (primary) hypertension: Secondary | ICD-10-CM | POA: Diagnosis not present

## 2015-06-26 DIAGNOSIS — M1990 Unspecified osteoarthritis, unspecified site: Secondary | ICD-10-CM | POA: Diagnosis not present

## 2015-06-26 DIAGNOSIS — K219 Gastro-esophageal reflux disease without esophagitis: Secondary | ICD-10-CM | POA: Diagnosis not present

## 2015-06-26 DIAGNOSIS — M8580 Other specified disorders of bone density and structure, unspecified site: Secondary | ICD-10-CM | POA: Diagnosis not present

## 2015-06-26 DIAGNOSIS — D45 Polycythemia vera: Secondary | ICD-10-CM | POA: Diagnosis not present

## 2015-06-26 DIAGNOSIS — Z96641 Presence of right artificial hip joint: Secondary | ICD-10-CM | POA: Diagnosis not present

## 2015-06-26 DIAGNOSIS — Z1389 Encounter for screening for other disorder: Secondary | ICD-10-CM | POA: Diagnosis not present

## 2015-07-21 DIAGNOSIS — E875 Hyperkalemia: Secondary | ICD-10-CM | POA: Diagnosis not present

## 2015-07-21 DIAGNOSIS — D473 Essential (hemorrhagic) thrombocythemia: Secondary | ICD-10-CM | POA: Diagnosis not present

## 2015-07-21 DIAGNOSIS — I10 Essential (primary) hypertension: Secondary | ICD-10-CM | POA: Diagnosis not present

## 2015-07-22 DIAGNOSIS — E875 Hyperkalemia: Secondary | ICD-10-CM | POA: Diagnosis not present

## 2015-08-18 DIAGNOSIS — H2513 Age-related nuclear cataract, bilateral: Secondary | ICD-10-CM | POA: Diagnosis not present

## 2015-08-18 DIAGNOSIS — H401111 Primary open-angle glaucoma, right eye, mild stage: Secondary | ICD-10-CM | POA: Diagnosis not present

## 2015-09-15 DIAGNOSIS — M5441 Lumbago with sciatica, right side: Secondary | ICD-10-CM | POA: Diagnosis not present

## 2015-09-15 DIAGNOSIS — M545 Low back pain: Secondary | ICD-10-CM | POA: Diagnosis not present

## 2015-09-20 DIAGNOSIS — Z0000 Encounter for general adult medical examination without abnormal findings: Secondary | ICD-10-CM | POA: Diagnosis not present

## 2015-09-20 DIAGNOSIS — I10 Essential (primary) hypertension: Secondary | ICD-10-CM | POA: Diagnosis not present

## 2015-09-20 DIAGNOSIS — D473 Essential (hemorrhagic) thrombocythemia: Secondary | ICD-10-CM | POA: Diagnosis not present

## 2015-09-23 DIAGNOSIS — L299 Pruritus, unspecified: Secondary | ICD-10-CM | POA: Diagnosis not present

## 2015-09-23 DIAGNOSIS — Z6822 Body mass index (BMI) 22.0-22.9, adult: Secondary | ICD-10-CM | POA: Diagnosis not present

## 2015-12-18 DIAGNOSIS — H401111 Primary open-angle glaucoma, right eye, mild stage: Secondary | ICD-10-CM | POA: Diagnosis not present

## 2015-12-23 DIAGNOSIS — W010XXA Fall on same level from slipping, tripping and stumbling without subsequent striking against object, initial encounter: Secondary | ICD-10-CM | POA: Diagnosis not present

## 2015-12-23 DIAGNOSIS — D696 Thrombocytopenia, unspecified: Secondary | ICD-10-CM | POA: Diagnosis not present

## 2015-12-23 DIAGNOSIS — Z7982 Long term (current) use of aspirin: Secondary | ICD-10-CM | POA: Diagnosis not present

## 2015-12-23 DIAGNOSIS — S59291A Other physeal fracture of lower end of radius, right arm, initial encounter for closed fracture: Secondary | ICD-10-CM | POA: Diagnosis not present

## 2015-12-23 DIAGNOSIS — W19XXXA Unspecified fall, initial encounter: Secondary | ICD-10-CM | POA: Diagnosis not present

## 2015-12-23 DIAGNOSIS — I10 Essential (primary) hypertension: Secondary | ICD-10-CM | POA: Diagnosis not present

## 2015-12-23 DIAGNOSIS — Z79899 Other long term (current) drug therapy: Secondary | ICD-10-CM | POA: Diagnosis not present

## 2015-12-23 DIAGNOSIS — S52611A Displaced fracture of right ulna styloid process, initial encounter for closed fracture: Secondary | ICD-10-CM | POA: Diagnosis not present

## 2015-12-23 DIAGNOSIS — S52501A Unspecified fracture of the lower end of right radius, initial encounter for closed fracture: Secondary | ICD-10-CM | POA: Diagnosis not present

## 2015-12-24 DIAGNOSIS — M8580 Other specified disorders of bone density and structure, unspecified site: Secondary | ICD-10-CM | POA: Diagnosis not present

## 2015-12-24 DIAGNOSIS — E559 Vitamin D deficiency, unspecified: Secondary | ICD-10-CM | POA: Diagnosis not present

## 2015-12-24 DIAGNOSIS — R69 Illness, unspecified: Secondary | ICD-10-CM | POA: Diagnosis not present

## 2015-12-24 DIAGNOSIS — Z23 Encounter for immunization: Secondary | ICD-10-CM | POA: Diagnosis not present

## 2015-12-24 DIAGNOSIS — Z0000 Encounter for general adult medical examination without abnormal findings: Secondary | ICD-10-CM | POA: Diagnosis not present

## 2015-12-24 DIAGNOSIS — E875 Hyperkalemia: Secondary | ICD-10-CM | POA: Diagnosis not present

## 2015-12-24 DIAGNOSIS — D473 Essential (hemorrhagic) thrombocythemia: Secondary | ICD-10-CM | POA: Diagnosis not present

## 2015-12-24 DIAGNOSIS — D45 Polycythemia vera: Secondary | ICD-10-CM | POA: Diagnosis not present

## 2015-12-29 DIAGNOSIS — I10 Essential (primary) hypertension: Secondary | ICD-10-CM | POA: Diagnosis not present

## 2015-12-29 DIAGNOSIS — D45 Polycythemia vera: Secondary | ICD-10-CM | POA: Diagnosis not present

## 2015-12-29 DIAGNOSIS — D473 Essential (hemorrhagic) thrombocythemia: Secondary | ICD-10-CM | POA: Diagnosis not present

## 2015-12-29 DIAGNOSIS — E875 Hyperkalemia: Secondary | ICD-10-CM | POA: Diagnosis not present

## 2015-12-29 DIAGNOSIS — K219 Gastro-esophageal reflux disease without esophagitis: Secondary | ICD-10-CM | POA: Diagnosis not present

## 2015-12-30 DIAGNOSIS — S52591D Other fractures of lower end of right radius, subsequent encounter for closed fracture with routine healing: Secondary | ICD-10-CM | POA: Diagnosis not present

## 2015-12-31 DIAGNOSIS — M25531 Pain in right wrist: Secondary | ICD-10-CM | POA: Diagnosis not present

## 2016-01-02 DIAGNOSIS — G8918 Other acute postprocedural pain: Secondary | ICD-10-CM | POA: Diagnosis not present

## 2016-01-02 DIAGNOSIS — S52571A Other intraarticular fracture of lower end of right radius, initial encounter for closed fracture: Secondary | ICD-10-CM | POA: Diagnosis not present

## 2016-01-02 DIAGNOSIS — S52501A Unspecified fracture of the lower end of right radius, initial encounter for closed fracture: Secondary | ICD-10-CM | POA: Diagnosis not present

## 2016-01-16 DIAGNOSIS — S52501D Unspecified fracture of the lower end of right radius, subsequent encounter for closed fracture with routine healing: Secondary | ICD-10-CM | POA: Diagnosis not present

## 2016-02-09 DIAGNOSIS — Z96641 Presence of right artificial hip joint: Secondary | ICD-10-CM | POA: Diagnosis not present

## 2016-02-09 DIAGNOSIS — I10 Essential (primary) hypertension: Secondary | ICD-10-CM | POA: Diagnosis not present

## 2016-02-09 DIAGNOSIS — D751 Secondary polycythemia: Secondary | ICD-10-CM | POA: Diagnosis not present

## 2016-02-09 DIAGNOSIS — M8588 Other specified disorders of bone density and structure, other site: Secondary | ICD-10-CM | POA: Diagnosis not present

## 2016-02-09 DIAGNOSIS — Z79899 Other long term (current) drug therapy: Secondary | ICD-10-CM | POA: Diagnosis not present

## 2016-02-09 DIAGNOSIS — Z7982 Long term (current) use of aspirin: Secondary | ICD-10-CM | POA: Diagnosis not present

## 2016-02-09 DIAGNOSIS — Z780 Asymptomatic menopausal state: Secondary | ICD-10-CM | POA: Diagnosis not present

## 2016-02-09 DIAGNOSIS — M8589 Other specified disorders of bone density and structure, multiple sites: Secondary | ICD-10-CM | POA: Diagnosis not present

## 2016-02-09 DIAGNOSIS — M85852 Other specified disorders of bone density and structure, left thigh: Secondary | ICD-10-CM | POA: Diagnosis not present

## 2016-02-16 DIAGNOSIS — S52501D Unspecified fracture of the lower end of right radius, subsequent encounter for closed fracture with routine healing: Secondary | ICD-10-CM | POA: Diagnosis not present

## 2016-03-04 DIAGNOSIS — Z1231 Encounter for screening mammogram for malignant neoplasm of breast: Secondary | ICD-10-CM | POA: Diagnosis not present

## 2016-04-22 DIAGNOSIS — H401111 Primary open-angle glaucoma, right eye, mild stage: Secondary | ICD-10-CM | POA: Diagnosis not present

## 2016-06-22 DIAGNOSIS — R69 Illness, unspecified: Secondary | ICD-10-CM | POA: Diagnosis not present

## 2016-06-22 DIAGNOSIS — K219 Gastro-esophageal reflux disease without esophagitis: Secondary | ICD-10-CM | POA: Diagnosis not present

## 2016-06-22 DIAGNOSIS — R0989 Other specified symptoms and signs involving the circulatory and respiratory systems: Secondary | ICD-10-CM | POA: Diagnosis not present

## 2016-06-22 DIAGNOSIS — M1990 Unspecified osteoarthritis, unspecified site: Secondary | ICD-10-CM | POA: Diagnosis not present

## 2016-06-22 DIAGNOSIS — D473 Essential (hemorrhagic) thrombocythemia: Secondary | ICD-10-CM | POA: Diagnosis not present

## 2016-06-22 DIAGNOSIS — E875 Hyperkalemia: Secondary | ICD-10-CM | POA: Diagnosis not present

## 2016-06-22 DIAGNOSIS — I10 Essential (primary) hypertension: Secondary | ICD-10-CM | POA: Diagnosis not present

## 2016-07-01 DIAGNOSIS — D473 Essential (hemorrhagic) thrombocythemia: Secondary | ICD-10-CM | POA: Diagnosis not present

## 2016-07-01 DIAGNOSIS — E875 Hyperkalemia: Secondary | ICD-10-CM | POA: Diagnosis not present

## 2016-07-01 DIAGNOSIS — I10 Essential (primary) hypertension: Secondary | ICD-10-CM | POA: Diagnosis not present

## 2016-07-01 DIAGNOSIS — M1611 Unilateral primary osteoarthritis, right hip: Secondary | ICD-10-CM | POA: Diagnosis not present

## 2016-07-01 DIAGNOSIS — E559 Vitamin D deficiency, unspecified: Secondary | ICD-10-CM | POA: Diagnosis not present

## 2016-07-01 DIAGNOSIS — J302 Other seasonal allergic rhinitis: Secondary | ICD-10-CM | POA: Diagnosis not present

## 2016-07-01 DIAGNOSIS — Z96641 Presence of right artificial hip joint: Secondary | ICD-10-CM | POA: Diagnosis not present

## 2016-07-01 DIAGNOSIS — M8580 Other specified disorders of bone density and structure, unspecified site: Secondary | ICD-10-CM | POA: Diagnosis not present

## 2016-07-01 DIAGNOSIS — D45 Polycythemia vera: Secondary | ICD-10-CM | POA: Diagnosis not present

## 2016-07-01 DIAGNOSIS — K219 Gastro-esophageal reflux disease without esophagitis: Secondary | ICD-10-CM | POA: Diagnosis not present

## 2016-07-07 DIAGNOSIS — I10 Essential (primary) hypertension: Secondary | ICD-10-CM | POA: Diagnosis not present

## 2016-07-07 DIAGNOSIS — M5441 Lumbago with sciatica, right side: Secondary | ICD-10-CM | POA: Diagnosis not present

## 2016-07-07 DIAGNOSIS — M545 Low back pain: Secondary | ICD-10-CM | POA: Diagnosis not present

## 2016-09-02 DIAGNOSIS — H2510 Age-related nuclear cataract, unspecified eye: Secondary | ICD-10-CM | POA: Diagnosis not present

## 2016-09-02 DIAGNOSIS — H401111 Primary open-angle glaucoma, right eye, mild stage: Secondary | ICD-10-CM | POA: Diagnosis not present

## 2016-12-23 DIAGNOSIS — E559 Vitamin D deficiency, unspecified: Secondary | ICD-10-CM | POA: Diagnosis not present

## 2016-12-23 DIAGNOSIS — E875 Hyperkalemia: Secondary | ICD-10-CM | POA: Diagnosis not present

## 2016-12-23 DIAGNOSIS — D45 Polycythemia vera: Secondary | ICD-10-CM | POA: Diagnosis not present

## 2016-12-23 DIAGNOSIS — I10 Essential (primary) hypertension: Secondary | ICD-10-CM | POA: Diagnosis not present

## 2016-12-23 DIAGNOSIS — K219 Gastro-esophageal reflux disease without esophagitis: Secondary | ICD-10-CM | POA: Diagnosis not present

## 2016-12-23 DIAGNOSIS — R0989 Other specified symptoms and signs involving the circulatory and respiratory systems: Secondary | ICD-10-CM | POA: Diagnosis not present

## 2016-12-23 DIAGNOSIS — D473 Essential (hemorrhagic) thrombocythemia: Secondary | ICD-10-CM | POA: Diagnosis not present

## 2016-12-23 DIAGNOSIS — R69 Illness, unspecified: Secondary | ICD-10-CM | POA: Diagnosis not present

## 2016-12-30 DIAGNOSIS — E559 Vitamin D deficiency, unspecified: Secondary | ICD-10-CM | POA: Diagnosis not present

## 2016-12-30 DIAGNOSIS — E875 Hyperkalemia: Secondary | ICD-10-CM | POA: Diagnosis not present

## 2016-12-30 DIAGNOSIS — D45 Polycythemia vera: Secondary | ICD-10-CM | POA: Diagnosis not present

## 2016-12-30 DIAGNOSIS — J302 Other seasonal allergic rhinitis: Secondary | ICD-10-CM | POA: Diagnosis not present

## 2016-12-30 DIAGNOSIS — D473 Essential (hemorrhagic) thrombocythemia: Secondary | ICD-10-CM | POA: Diagnosis not present

## 2016-12-30 DIAGNOSIS — R69 Illness, unspecified: Secondary | ICD-10-CM | POA: Diagnosis not present

## 2016-12-30 DIAGNOSIS — Z0000 Encounter for general adult medical examination without abnormal findings: Secondary | ICD-10-CM | POA: Diagnosis not present

## 2016-12-30 DIAGNOSIS — K219 Gastro-esophageal reflux disease without esophagitis: Secondary | ICD-10-CM | POA: Diagnosis not present

## 2016-12-30 DIAGNOSIS — Z23 Encounter for immunization: Secondary | ICD-10-CM | POA: Diagnosis not present

## 2016-12-30 DIAGNOSIS — I10 Essential (primary) hypertension: Secondary | ICD-10-CM | POA: Diagnosis not present

## 2017-01-06 DIAGNOSIS — H52209 Unspecified astigmatism, unspecified eye: Secondary | ICD-10-CM | POA: Diagnosis not present

## 2017-01-06 DIAGNOSIS — H401112 Primary open-angle glaucoma, right eye, moderate stage: Secondary | ICD-10-CM | POA: Diagnosis not present

## 2017-01-06 DIAGNOSIS — H2510 Age-related nuclear cataract, unspecified eye: Secondary | ICD-10-CM | POA: Diagnosis not present

## 2017-01-21 DIAGNOSIS — D473 Essential (hemorrhagic) thrombocythemia: Secondary | ICD-10-CM | POA: Diagnosis not present

## 2017-01-21 DIAGNOSIS — K219 Gastro-esophageal reflux disease without esophagitis: Secondary | ICD-10-CM | POA: Diagnosis not present

## 2017-01-21 DIAGNOSIS — I10 Essential (primary) hypertension: Secondary | ICD-10-CM | POA: Diagnosis not present

## 2017-01-21 DIAGNOSIS — R0989 Other specified symptoms and signs involving the circulatory and respiratory systems: Secondary | ICD-10-CM | POA: Diagnosis not present

## 2017-01-21 DIAGNOSIS — D45 Polycythemia vera: Secondary | ICD-10-CM | POA: Diagnosis not present

## 2017-01-21 DIAGNOSIS — E875 Hyperkalemia: Secondary | ICD-10-CM | POA: Diagnosis not present

## 2017-02-24 DIAGNOSIS — H401112 Primary open-angle glaucoma, right eye, moderate stage: Secondary | ICD-10-CM | POA: Diagnosis not present

## 2017-03-10 DIAGNOSIS — Z1231 Encounter for screening mammogram for malignant neoplasm of breast: Secondary | ICD-10-CM | POA: Diagnosis not present

## 2017-03-25 DIAGNOSIS — H2513 Age-related nuclear cataract, bilateral: Secondary | ICD-10-CM | POA: Diagnosis not present

## 2017-03-25 DIAGNOSIS — H401112 Primary open-angle glaucoma, right eye, moderate stage: Secondary | ICD-10-CM | POA: Diagnosis not present

## 2017-04-05 DIAGNOSIS — K219 Gastro-esophageal reflux disease without esophagitis: Secondary | ICD-10-CM | POA: Diagnosis not present

## 2017-04-05 DIAGNOSIS — R69 Illness, unspecified: Secondary | ICD-10-CM | POA: Diagnosis not present

## 2017-04-05 DIAGNOSIS — E875 Hyperkalemia: Secondary | ICD-10-CM | POA: Diagnosis not present

## 2017-04-05 DIAGNOSIS — I10 Essential (primary) hypertension: Secondary | ICD-10-CM | POA: Diagnosis not present

## 2017-04-05 DIAGNOSIS — D473 Essential (hemorrhagic) thrombocythemia: Secondary | ICD-10-CM | POA: Diagnosis not present

## 2017-04-14 DIAGNOSIS — Z6822 Body mass index (BMI) 22.0-22.9, adult: Secondary | ICD-10-CM | POA: Diagnosis not present

## 2017-04-14 DIAGNOSIS — E039 Hypothyroidism, unspecified: Secondary | ICD-10-CM | POA: Diagnosis not present

## 2017-04-14 DIAGNOSIS — D473 Essential (hemorrhagic) thrombocythemia: Secondary | ICD-10-CM | POA: Diagnosis not present

## 2017-04-14 DIAGNOSIS — E875 Hyperkalemia: Secondary | ICD-10-CM | POA: Diagnosis not present

## 2017-04-14 DIAGNOSIS — D45 Polycythemia vera: Secondary | ICD-10-CM | POA: Diagnosis not present

## 2017-04-14 DIAGNOSIS — J302 Other seasonal allergic rhinitis: Secondary | ICD-10-CM | POA: Diagnosis not present

## 2017-04-14 DIAGNOSIS — R69 Illness, unspecified: Secondary | ICD-10-CM | POA: Diagnosis not present

## 2017-04-14 DIAGNOSIS — I10 Essential (primary) hypertension: Secondary | ICD-10-CM | POA: Diagnosis not present

## 2017-04-15 DIAGNOSIS — H2512 Age-related nuclear cataract, left eye: Secondary | ICD-10-CM | POA: Diagnosis not present

## 2017-04-21 DIAGNOSIS — H2512 Age-related nuclear cataract, left eye: Secondary | ICD-10-CM | POA: Diagnosis not present

## 2017-05-03 DIAGNOSIS — H2512 Age-related nuclear cataract, left eye: Secondary | ICD-10-CM | POA: Diagnosis not present

## 2017-07-04 DIAGNOSIS — Z96641 Presence of right artificial hip joint: Secondary | ICD-10-CM | POA: Diagnosis not present

## 2017-07-04 DIAGNOSIS — G8929 Other chronic pain: Secondary | ICD-10-CM | POA: Diagnosis not present

## 2017-07-04 DIAGNOSIS — K08109 Complete loss of teeth, unspecified cause, unspecified class: Secondary | ICD-10-CM | POA: Diagnosis not present

## 2017-07-04 DIAGNOSIS — E213 Hyperparathyroidism, unspecified: Secondary | ICD-10-CM | POA: Diagnosis not present

## 2017-07-04 DIAGNOSIS — I10 Essential (primary) hypertension: Secondary | ICD-10-CM | POA: Diagnosis not present

## 2017-07-04 DIAGNOSIS — D45 Polycythemia vera: Secondary | ICD-10-CM | POA: Diagnosis not present

## 2017-07-04 DIAGNOSIS — M7631 Iliotibial band syndrome, right leg: Secondary | ICD-10-CM | POA: Diagnosis not present

## 2017-07-04 DIAGNOSIS — Z800 Family history of malignant neoplasm of digestive organs: Secondary | ICD-10-CM | POA: Diagnosis not present

## 2017-07-04 DIAGNOSIS — Z6822 Body mass index (BMI) 22.0-22.9, adult: Secondary | ICD-10-CM | POA: Diagnosis not present

## 2017-07-04 DIAGNOSIS — Z7982 Long term (current) use of aspirin: Secondary | ICD-10-CM | POA: Diagnosis not present

## 2017-07-04 DIAGNOSIS — H409 Unspecified glaucoma: Secondary | ICD-10-CM | POA: Diagnosis not present

## 2017-07-04 DIAGNOSIS — M1611 Unilateral primary osteoarthritis, right hip: Secondary | ICD-10-CM | POA: Diagnosis not present

## 2017-07-04 DIAGNOSIS — Z791 Long term (current) use of non-steroidal anti-inflammatories (NSAID): Secondary | ICD-10-CM | POA: Diagnosis not present

## 2017-07-04 DIAGNOSIS — M1990 Unspecified osteoarthritis, unspecified site: Secondary | ICD-10-CM | POA: Diagnosis not present

## 2017-07-07 DIAGNOSIS — H401112 Primary open-angle glaucoma, right eye, moderate stage: Secondary | ICD-10-CM | POA: Diagnosis not present

## 2017-07-08 DIAGNOSIS — K219 Gastro-esophageal reflux disease without esophagitis: Secondary | ICD-10-CM | POA: Diagnosis not present

## 2017-07-08 DIAGNOSIS — R69 Illness, unspecified: Secondary | ICD-10-CM | POA: Diagnosis not present

## 2017-07-08 DIAGNOSIS — M1990 Unspecified osteoarthritis, unspecified site: Secondary | ICD-10-CM | POA: Diagnosis not present

## 2017-07-08 DIAGNOSIS — I10 Essential (primary) hypertension: Secondary | ICD-10-CM | POA: Diagnosis not present

## 2017-07-08 DIAGNOSIS — E039 Hypothyroidism, unspecified: Secondary | ICD-10-CM | POA: Diagnosis not present

## 2017-07-08 DIAGNOSIS — E875 Hyperkalemia: Secondary | ICD-10-CM | POA: Diagnosis not present

## 2017-07-14 DIAGNOSIS — Z6822 Body mass index (BMI) 22.0-22.9, adult: Secondary | ICD-10-CM | POA: Diagnosis not present

## 2017-07-14 DIAGNOSIS — Z1331 Encounter for screening for depression: Secondary | ICD-10-CM | POA: Diagnosis not present

## 2017-07-14 DIAGNOSIS — E039 Hypothyroidism, unspecified: Secondary | ICD-10-CM | POA: Diagnosis not present

## 2017-07-14 DIAGNOSIS — D45 Polycythemia vera: Secondary | ICD-10-CM | POA: Diagnosis not present

## 2017-07-14 DIAGNOSIS — E559 Vitamin D deficiency, unspecified: Secondary | ICD-10-CM | POA: Diagnosis not present

## 2017-07-14 DIAGNOSIS — D473 Essential (hemorrhagic) thrombocythemia: Secondary | ICD-10-CM | POA: Diagnosis not present

## 2017-07-14 DIAGNOSIS — I10 Essential (primary) hypertension: Secondary | ICD-10-CM | POA: Diagnosis not present

## 2017-07-14 DIAGNOSIS — Z1389 Encounter for screening for other disorder: Secondary | ICD-10-CM | POA: Diagnosis not present

## 2017-07-21 DIAGNOSIS — H401112 Primary open-angle glaucoma, right eye, moderate stage: Secondary | ICD-10-CM | POA: Diagnosis not present

## 2017-07-22 DIAGNOSIS — E875 Hyperkalemia: Secondary | ICD-10-CM | POA: Diagnosis not present

## 2017-08-04 DIAGNOSIS — H2511 Age-related nuclear cataract, right eye: Secondary | ICD-10-CM | POA: Diagnosis not present

## 2017-08-04 DIAGNOSIS — H401112 Primary open-angle glaucoma, right eye, moderate stage: Secondary | ICD-10-CM | POA: Diagnosis not present

## 2017-08-11 DIAGNOSIS — M5441 Lumbago with sciatica, right side: Secondary | ICD-10-CM | POA: Diagnosis not present

## 2017-08-11 DIAGNOSIS — M25551 Pain in right hip: Secondary | ICD-10-CM | POA: Diagnosis not present

## 2017-08-11 DIAGNOSIS — M549 Dorsalgia, unspecified: Secondary | ICD-10-CM | POA: Diagnosis not present

## 2017-08-22 DIAGNOSIS — H2511 Age-related nuclear cataract, right eye: Secondary | ICD-10-CM | POA: Diagnosis not present

## 2017-10-06 DIAGNOSIS — H401112 Primary open-angle glaucoma, right eye, moderate stage: Secondary | ICD-10-CM | POA: Diagnosis not present

## 2017-10-21 DIAGNOSIS — D473 Essential (hemorrhagic) thrombocythemia: Secondary | ICD-10-CM | POA: Diagnosis not present

## 2017-11-01 DIAGNOSIS — M25551 Pain in right hip: Secondary | ICD-10-CM | POA: Diagnosis not present

## 2017-11-04 DIAGNOSIS — D519 Vitamin B12 deficiency anemia, unspecified: Secondary | ICD-10-CM | POA: Diagnosis not present

## 2017-11-04 DIAGNOSIS — E559 Vitamin D deficiency, unspecified: Secondary | ICD-10-CM | POA: Diagnosis not present

## 2017-11-04 DIAGNOSIS — I10 Essential (primary) hypertension: Secondary | ICD-10-CM | POA: Diagnosis not present

## 2017-11-04 DIAGNOSIS — R0989 Other specified symptoms and signs involving the circulatory and respiratory systems: Secondary | ICD-10-CM | POA: Diagnosis not present

## 2017-11-04 DIAGNOSIS — D473 Essential (hemorrhagic) thrombocythemia: Secondary | ICD-10-CM | POA: Diagnosis not present

## 2017-11-04 DIAGNOSIS — E039 Hypothyroidism, unspecified: Secondary | ICD-10-CM | POA: Diagnosis not present

## 2017-11-04 DIAGNOSIS — D45 Polycythemia vera: Secondary | ICD-10-CM | POA: Diagnosis not present

## 2017-11-04 DIAGNOSIS — M1990 Unspecified osteoarthritis, unspecified site: Secondary | ICD-10-CM | POA: Diagnosis not present

## 2017-11-04 DIAGNOSIS — E875 Hyperkalemia: Secondary | ICD-10-CM | POA: Diagnosis not present

## 2017-11-04 DIAGNOSIS — K219 Gastro-esophageal reflux disease without esophagitis: Secondary | ICD-10-CM | POA: Diagnosis not present

## 2017-11-11 DIAGNOSIS — H6123 Impacted cerumen, bilateral: Secondary | ICD-10-CM | POA: Diagnosis not present

## 2017-11-11 DIAGNOSIS — I10 Essential (primary) hypertension: Secondary | ICD-10-CM | POA: Diagnosis not present

## 2017-11-11 DIAGNOSIS — E559 Vitamin D deficiency, unspecified: Secondary | ICD-10-CM | POA: Diagnosis not present

## 2017-11-11 DIAGNOSIS — Z6821 Body mass index (BMI) 21.0-21.9, adult: Secondary | ICD-10-CM | POA: Diagnosis not present

## 2017-11-11 DIAGNOSIS — D45 Polycythemia vera: Secondary | ICD-10-CM | POA: Diagnosis not present

## 2017-11-11 DIAGNOSIS — E039 Hypothyroidism, unspecified: Secondary | ICD-10-CM | POA: Diagnosis not present

## 2017-11-11 DIAGNOSIS — E875 Hyperkalemia: Secondary | ICD-10-CM | POA: Diagnosis not present

## 2017-11-11 DIAGNOSIS — Z0001 Encounter for general adult medical examination with abnormal findings: Secondary | ICD-10-CM | POA: Diagnosis not present

## 2017-12-01 DIAGNOSIS — H401112 Primary open-angle glaucoma, right eye, moderate stage: Secondary | ICD-10-CM | POA: Diagnosis not present

## 2017-12-29 DIAGNOSIS — E559 Vitamin D deficiency, unspecified: Secondary | ICD-10-CM | POA: Diagnosis not present

## 2017-12-29 DIAGNOSIS — I10 Essential (primary) hypertension: Secondary | ICD-10-CM | POA: Diagnosis not present

## 2017-12-29 DIAGNOSIS — E875 Hyperkalemia: Secondary | ICD-10-CM | POA: Diagnosis not present

## 2017-12-29 DIAGNOSIS — K219 Gastro-esophageal reflux disease without esophagitis: Secondary | ICD-10-CM | POA: Diagnosis not present

## 2017-12-29 DIAGNOSIS — E039 Hypothyroidism, unspecified: Secondary | ICD-10-CM | POA: Diagnosis not present

## 2018-01-04 DIAGNOSIS — D473 Essential (hemorrhagic) thrombocythemia: Secondary | ICD-10-CM | POA: Diagnosis not present

## 2018-01-04 DIAGNOSIS — Z6821 Body mass index (BMI) 21.0-21.9, adult: Secondary | ICD-10-CM | POA: Diagnosis not present

## 2018-01-04 DIAGNOSIS — E039 Hypothyroidism, unspecified: Secondary | ICD-10-CM | POA: Diagnosis not present

## 2018-01-04 DIAGNOSIS — Z96641 Presence of right artificial hip joint: Secondary | ICD-10-CM | POA: Diagnosis not present

## 2018-01-04 DIAGNOSIS — E559 Vitamin D deficiency, unspecified: Secondary | ICD-10-CM | POA: Diagnosis not present

## 2018-01-04 DIAGNOSIS — D45 Polycythemia vera: Secondary | ICD-10-CM | POA: Diagnosis not present

## 2018-01-04 DIAGNOSIS — I10 Essential (primary) hypertension: Secondary | ICD-10-CM | POA: Diagnosis not present

## 2018-01-04 DIAGNOSIS — R69 Illness, unspecified: Secondary | ICD-10-CM | POA: Diagnosis not present

## 2018-01-19 DIAGNOSIS — Z6821 Body mass index (BMI) 21.0-21.9, adult: Secondary | ICD-10-CM | POA: Diagnosis not present

## 2018-01-19 DIAGNOSIS — I10 Essential (primary) hypertension: Secondary | ICD-10-CM | POA: Diagnosis not present

## 2018-01-19 DIAGNOSIS — Z23 Encounter for immunization: Secondary | ICD-10-CM | POA: Diagnosis not present

## 2018-03-20 DIAGNOSIS — Z1231 Encounter for screening mammogram for malignant neoplasm of breast: Secondary | ICD-10-CM | POA: Diagnosis not present

## 2018-04-05 DIAGNOSIS — R922 Inconclusive mammogram: Secondary | ICD-10-CM | POA: Diagnosis not present

## 2018-04-05 DIAGNOSIS — N6489 Other specified disorders of breast: Secondary | ICD-10-CM | POA: Diagnosis not present

## 2018-04-20 DIAGNOSIS — E039 Hypothyroidism, unspecified: Secondary | ICD-10-CM | POA: Diagnosis not present

## 2018-04-20 DIAGNOSIS — R0989 Other specified symptoms and signs involving the circulatory and respiratory systems: Secondary | ICD-10-CM | POA: Diagnosis not present

## 2018-04-20 DIAGNOSIS — D45 Polycythemia vera: Secondary | ICD-10-CM | POA: Diagnosis not present

## 2018-04-20 DIAGNOSIS — E875 Hyperkalemia: Secondary | ICD-10-CM | POA: Diagnosis not present

## 2018-04-20 DIAGNOSIS — K219 Gastro-esophageal reflux disease without esophagitis: Secondary | ICD-10-CM | POA: Diagnosis not present

## 2018-04-20 DIAGNOSIS — R69 Illness, unspecified: Secondary | ICD-10-CM | POA: Diagnosis not present

## 2018-04-20 DIAGNOSIS — I10 Essential (primary) hypertension: Secondary | ICD-10-CM | POA: Diagnosis not present

## 2018-04-25 DIAGNOSIS — D473 Essential (hemorrhagic) thrombocythemia: Secondary | ICD-10-CM | POA: Diagnosis not present

## 2018-04-25 DIAGNOSIS — E875 Hyperkalemia: Secondary | ICD-10-CM | POA: Diagnosis not present

## 2018-04-25 DIAGNOSIS — Z96641 Presence of right artificial hip joint: Secondary | ICD-10-CM | POA: Diagnosis not present

## 2018-04-25 DIAGNOSIS — Z6820 Body mass index (BMI) 20.0-20.9, adult: Secondary | ICD-10-CM | POA: Diagnosis not present

## 2018-04-25 DIAGNOSIS — I10 Essential (primary) hypertension: Secondary | ICD-10-CM | POA: Diagnosis not present

## 2018-04-25 DIAGNOSIS — D45 Polycythemia vera: Secondary | ICD-10-CM | POA: Diagnosis not present

## 2018-04-25 DIAGNOSIS — E559 Vitamin D deficiency, unspecified: Secondary | ICD-10-CM | POA: Diagnosis not present

## 2018-04-25 DIAGNOSIS — E039 Hypothyroidism, unspecified: Secondary | ICD-10-CM | POA: Diagnosis not present

## 2018-05-11 DIAGNOSIS — H401112 Primary open-angle glaucoma, right eye, moderate stage: Secondary | ICD-10-CM | POA: Diagnosis not present

## 2018-05-11 DIAGNOSIS — Z961 Presence of intraocular lens: Secondary | ICD-10-CM | POA: Diagnosis not present

## 2018-05-11 DIAGNOSIS — H524 Presbyopia: Secondary | ICD-10-CM | POA: Diagnosis not present

## 2018-05-11 DIAGNOSIS — Z9849 Cataract extraction status, unspecified eye: Secondary | ICD-10-CM | POA: Diagnosis not present

## 2018-05-11 DIAGNOSIS — H5211 Myopia, right eye: Secondary | ICD-10-CM | POA: Diagnosis not present

## 2018-05-11 DIAGNOSIS — H52223 Regular astigmatism, bilateral: Secondary | ICD-10-CM | POA: Diagnosis not present

## 2018-05-15 DIAGNOSIS — Z6820 Body mass index (BMI) 20.0-20.9, adult: Secondary | ICD-10-CM | POA: Diagnosis not present

## 2018-05-15 DIAGNOSIS — J019 Acute sinusitis, unspecified: Secondary | ICD-10-CM | POA: Diagnosis not present

## 2018-05-15 DIAGNOSIS — D45 Polycythemia vera: Secondary | ICD-10-CM | POA: Diagnosis not present

## 2018-05-15 DIAGNOSIS — R040 Epistaxis: Secondary | ICD-10-CM | POA: Diagnosis not present

## 2018-05-31 DIAGNOSIS — R040 Epistaxis: Secondary | ICD-10-CM | POA: Diagnosis not present

## 2018-06-26 DIAGNOSIS — E7800 Pure hypercholesterolemia, unspecified: Secondary | ICD-10-CM | POA: Diagnosis not present

## 2018-06-26 DIAGNOSIS — Z79899 Other long term (current) drug therapy: Secondary | ICD-10-CM | POA: Diagnosis not present

## 2018-06-26 DIAGNOSIS — Z7982 Long term (current) use of aspirin: Secondary | ICD-10-CM | POA: Diagnosis not present

## 2018-06-26 DIAGNOSIS — R040 Epistaxis: Secondary | ICD-10-CM | POA: Diagnosis not present

## 2018-06-26 DIAGNOSIS — I10 Essential (primary) hypertension: Secondary | ICD-10-CM | POA: Diagnosis not present

## 2018-07-25 DIAGNOSIS — I10 Essential (primary) hypertension: Secondary | ICD-10-CM | POA: Diagnosis not present

## 2018-07-25 DIAGNOSIS — K219 Gastro-esophageal reflux disease without esophagitis: Secondary | ICD-10-CM | POA: Diagnosis not present

## 2018-07-25 DIAGNOSIS — E875 Hyperkalemia: Secondary | ICD-10-CM | POA: Diagnosis not present

## 2018-07-25 DIAGNOSIS — E559 Vitamin D deficiency, unspecified: Secondary | ICD-10-CM | POA: Diagnosis not present

## 2018-07-25 DIAGNOSIS — E039 Hypothyroidism, unspecified: Secondary | ICD-10-CM | POA: Diagnosis not present

## 2018-07-27 DIAGNOSIS — I10 Essential (primary) hypertension: Secondary | ICD-10-CM | POA: Diagnosis not present

## 2018-07-27 DIAGNOSIS — D45 Polycythemia vera: Secondary | ICD-10-CM | POA: Diagnosis not present

## 2018-07-27 DIAGNOSIS — D473 Essential (hemorrhagic) thrombocythemia: Secondary | ICD-10-CM | POA: Diagnosis not present

## 2018-07-27 DIAGNOSIS — Z0001 Encounter for general adult medical examination with abnormal findings: Secondary | ICD-10-CM | POA: Diagnosis not present

## 2018-07-27 DIAGNOSIS — Z6821 Body mass index (BMI) 21.0-21.9, adult: Secondary | ICD-10-CM | POA: Diagnosis not present

## 2018-07-27 DIAGNOSIS — E875 Hyperkalemia: Secondary | ICD-10-CM | POA: Diagnosis not present

## 2018-07-27 DIAGNOSIS — E039 Hypothyroidism, unspecified: Secondary | ICD-10-CM | POA: Diagnosis not present

## 2019-01-05 ENCOUNTER — Other Ambulatory Visit: Payer: Self-pay | Admitting: Neurological Surgery

## 2019-01-15 ENCOUNTER — Other Ambulatory Visit: Payer: Self-pay

## 2019-01-15 ENCOUNTER — Encounter (HOSPITAL_COMMUNITY): Payer: Self-pay

## 2019-01-15 ENCOUNTER — Inpatient Hospital Stay (HOSPITAL_COMMUNITY): Admission: RE | Admit: 2019-01-15 | Payer: Self-pay | Source: Ambulatory Visit

## 2019-01-15 ENCOUNTER — Other Ambulatory Visit (HOSPITAL_COMMUNITY): Payer: Self-pay

## 2019-01-15 ENCOUNTER — Encounter (HOSPITAL_COMMUNITY)
Admission: RE | Admit: 2019-01-15 | Discharge: 2019-01-15 | Disposition: A | Discharge status: Home / Self Care | Payer: Medicare HMO | Source: Ambulatory Visit | Attending: Neurological Surgery | Admitting: Neurological Surgery

## 2019-01-15 DIAGNOSIS — Z01818 Encounter for other preprocedural examination: Secondary | ICD-10-CM | POA: Insufficient documentation

## 2019-01-15 DIAGNOSIS — Z20822 Contact with and (suspected) exposure to covid-19: Secondary | ICD-10-CM

## 2019-01-15 DIAGNOSIS — I10 Essential (primary) hypertension: Secondary | ICD-10-CM | POA: Insufficient documentation

## 2019-01-15 HISTORY — DX: Personal history of other medical treatment: Z92.89

## 2019-01-15 HISTORY — DX: Anemia, unspecified: D64.9

## 2019-01-15 HISTORY — DX: Gastrointestinal hemorrhage, unspecified: K92.2

## 2019-01-15 HISTORY — DX: Presence of dental prosthetic device (complete) (partial): K08.109

## 2019-01-15 HISTORY — DX: Headache, unspecified: R51.9

## 2019-01-15 HISTORY — DX: Presence of spectacles and contact lenses: Z97.3

## 2019-01-15 HISTORY — DX: Disease of spinal cord, unspecified: G95.9

## 2019-01-15 HISTORY — DX: Unspecified glaucoma: H40.9

## 2019-01-15 HISTORY — DX: Presence of dental prosthetic device (complete) (partial): Z97.2

## 2019-01-15 HISTORY — DX: Diverticulitis of intestine, part unspecified, without perforation or abscess without bleeding: K57.92

## 2019-01-15 HISTORY — DX: Thrombocytosis, unspecified: D75.839

## 2019-01-15 LAB — CBC
HCT: 30.8 % — ABNORMAL LOW (ref 36.0–46.0)
Hemoglobin: 10.2 g/dL — ABNORMAL LOW (ref 12.0–15.0)
MCH: 39.4 pg — ABNORMAL HIGH (ref 26.0–34.0)
MCHC: 33.1 g/dL (ref 30.0–36.0)
MCV: 118.9 fL — ABNORMAL HIGH (ref 80.0–100.0)
Platelets: 401 10*3/uL — ABNORMAL HIGH (ref 150–400)
RBC: 2.59 MIL/uL — ABNORMAL LOW (ref 3.87–5.11)
RDW: 18 % — ABNORMAL HIGH (ref 11.5–15.5)
WBC: 8.5 10*3/uL (ref 4.0–10.5)
nRBC: 1.1 % — ABNORMAL HIGH (ref 0.0–0.2)

## 2019-01-15 LAB — TYPE AND SCREEN
ABO/RH(D): A POS
Antibody Screen: NEGATIVE

## 2019-01-15 LAB — ABO/RH: ABO/RH(D): A POS

## 2019-01-15 LAB — SURGICAL PCR SCREEN
MRSA, PCR: NEGATIVE
Staphylococcus aureus: NEGATIVE

## 2019-01-15 NOTE — Pre-Procedure Instructions (Addendum)
   Gabrielle Donovan  01/15/2019     [id number] [place] [facility] [address] [place] [address] Phone: [phone] Fax: [phone]   Your procedure is scheduled on Thursday, January 18, 2019  Report to [facility] Admitting at 8:45 A.M.  Call this number if you have problems the morning of surgery:  [phone]   Remember:  Do not eat or drink after midnight Wednesday, January 17, 2019    Take these medicines the morning of surgery with A SIP OF WATER: NP THYROID, hydroxyurea (HYDREA), dorzolamide-timolol (COSOPT) eye drops  Stop taking Aspirin (unless otherwise advised by surgeon), vitamins, fish oil and herbal medications. Do not take any NSAIDs ie: Ibuprofen, Advil, Naproxen (Aleve), Motrin, BC and Goody Powder; stop now.   Do not wear jewelry, make-up or nail polish.  Do not wear lotions, powders, or perfumes, or deodorant.  Do not shave 48 hours prior to surgery.  Do not bring valuables to the hospital.  [facility] is not responsible for any belongings or valuables.  Contacts, dentures or bridgework may not be worn into surgery.  Leave your suitcase in the car.  After surgery it may be brought to your room. For patients admitted to the hospital, discharge time will be determined by your treatment team. Patients discharged the day of surgery will not be allowed to drive home.   Special instructions: See " [facility] Preparing For Surgery " sheet. Please read over the following fact sheets that you were given. Pain Booklet, Coughing and Deep Breathing and Surgical Site Infection Prevention

## 2019-01-15 NOTE — Progress Notes (Signed)
Pt denies SOB, chest pain, and being under the care of a cardiologist. Pt stated that she is under the care of Dr. Gaylene Brooks at [place]. Pt denies having an echo and cardiac cath. Pt denies having an EKG and chest x ray in the last year. Pt stated that she had labs " last week." Kaitlin, office coordinator, stated that pt had labs ( CMET) on 01/10/19 and did not have a CBC drawn at the office; Fara Chute stated that she would fax CMET results to nurse; awaiting response. Pt verbalized understanding of all pre-op instructions. Pt chart forwarded to PA, Anesthesiology, for review.

## 2019-01-16 LAB — NOVEL CORONAVIRUS, NAA: SARS-CoV-2, NAA: NOT DETECTED

## 2019-01-16 NOTE — Anesthesia Preprocedure Evaluation (Addendum)
Anesthesia Evaluation  Patient identified by MRN, date of birth, ID band Patient awake    Reviewed: Allergy & Precautions, NPO status , Patient's Chart, lab work & pertinent test results  Airway Mallampati: I  TM Distance: >3 FB Neck ROM: Full    Dental  (+) Edentulous Lower, Edentulous Upper   Pulmonary neg pulmonary ROS,    Pulmonary exam normal breath sounds clear to auscultation       Cardiovascular hypertension, Pt. on medications Normal cardiovascular exam Rhythm:Regular Rate:Normal  ECG: SR, rate 69   Neuro/Psych  Headaches, negative psych ROS   GI/Hepatic negative GI ROS, Neg liver ROS,   Endo/Other  Hypothyroidism   Renal/GU negative Renal ROS     Musculoskeletal negative musculoskeletal ROS (+)   Abdominal   Peds  Hematology  (+) anemia ,   Anesthesia Other Findings Cervical myelopathy  Reproductive/Obstetrics                            Anesthesia Physical Anesthesia Plan  ASA: II  Anesthesia Plan: General   Post-op Pain Management:    Induction: Intravenous  PONV Risk Score and Plan: 4 or greater and Midazolam, Dexamethasone, Ondansetron and Treatment may vary due to age or medical condition  Airway Management Planned: Oral ETT and Video Laryngoscope Planned  Additional Equipment:   Intra-op Plan:   Post-operative Plan: Extubation in OR  Informed Consent: I have reviewed the patients History and Physical, chart, labs and discussed the procedure including the risks, benefits and alternatives for the proposed anesthesia with the patient or authorized representative who has indicated his/her understanding and acceptance.     Dental advisory given  Plan Discussed with: CRNA  Anesthesia Plan Comments: (Per PA-C: Hx of anemia and thrombocytosis (on hydroxyurea). Stable per preop labs. Copy of CMP from PCP office dated 01/10/19 on pt chart. Reviewed, WNL.  EKG  01/15/19. NSR. Rate 69.)      Anesthesia Quick Evaluation

## 2019-01-18 ENCOUNTER — Inpatient Hospital Stay (HOSPITAL_COMMUNITY): Payer: Medicare HMO | Admitting: Certified Registered Nurse Anesthetist

## 2019-01-18 ENCOUNTER — Inpatient Hospital Stay (HOSPITAL_COMMUNITY)
Admission: RE | Admit: 2019-01-18 | Discharge: 2019-01-21 | DRG: 516 | Disposition: A | Discharge status: Home Health | Payer: Medicare HMO | Source: Ambulatory Visit | Attending: Neurological Surgery | Admitting: Neurological Surgery

## 2019-01-18 ENCOUNTER — Encounter (HOSPITAL_COMMUNITY)
Admission: RE | Disposition: A | Discharge status: Home Health | Payer: Self-pay | Source: Ambulatory Visit | Attending: Neurological Surgery

## 2019-01-18 ENCOUNTER — Inpatient Hospital Stay (HOSPITAL_COMMUNITY): Payer: Medicare HMO | Admitting: Vascular Surgery

## 2019-01-18 ENCOUNTER — Inpatient Hospital Stay (HOSPITAL_COMMUNITY): Payer: Medicare HMO

## 2019-01-18 ENCOUNTER — Encounter (HOSPITAL_COMMUNITY): Payer: Self-pay | Admitting: Certified Registered Nurse Anesthetist

## 2019-01-18 ENCOUNTER — Other Ambulatory Visit: Payer: Self-pay

## 2019-01-18 DIAGNOSIS — Z7982 Long term (current) use of aspirin: Secondary | ICD-10-CM

## 2019-01-18 DIAGNOSIS — G8194 Hemiplegia, unspecified affecting left nondominant side: Secondary | ICD-10-CM | POA: Diagnosis not present

## 2019-01-18 DIAGNOSIS — R110 Nausea: Secondary | ICD-10-CM | POA: Diagnosis not present

## 2019-01-18 DIAGNOSIS — Z79899 Other long term (current) drug therapy: Secondary | ICD-10-CM | POA: Diagnosis not present

## 2019-01-18 DIAGNOSIS — M1990 Unspecified osteoarthritis, unspecified site: Secondary | ICD-10-CM | POA: Diagnosis present

## 2019-01-18 DIAGNOSIS — H9313 Tinnitus, bilateral: Secondary | ICD-10-CM | POA: Diagnosis present

## 2019-01-18 DIAGNOSIS — I10 Essential (primary) hypertension: Secondary | ICD-10-CM | POA: Diagnosis not present

## 2019-01-18 DIAGNOSIS — R519 Headache, unspecified: Secondary | ICD-10-CM | POA: Diagnosis not present

## 2019-01-18 DIAGNOSIS — Z96641 Presence of right artificial hip joint: Secondary | ICD-10-CM | POA: Diagnosis not present

## 2019-01-18 DIAGNOSIS — Z7409 Other reduced mobility: Secondary | ICD-10-CM | POA: Diagnosis not present

## 2019-01-18 DIAGNOSIS — H409 Unspecified glaucoma: Secondary | ICD-10-CM | POA: Diagnosis not present

## 2019-01-18 DIAGNOSIS — Z419 Encounter for procedure for purposes other than remedying health state, unspecified: Secondary | ICD-10-CM

## 2019-01-18 DIAGNOSIS — Z789 Other specified health status: Secondary | ICD-10-CM | POA: Diagnosis not present

## 2019-01-18 DIAGNOSIS — E7800 Pure hypercholesterolemia, unspecified: Secondary | ICD-10-CM | POA: Diagnosis present

## 2019-01-18 DIAGNOSIS — M7138 Other bursal cyst, other site: Secondary | ICD-10-CM | POA: Diagnosis present

## 2019-01-18 DIAGNOSIS — E039 Hypothyroidism, unspecified: Secondary | ICD-10-CM | POA: Diagnosis not present

## 2019-01-18 DIAGNOSIS — R531 Weakness: Secondary | ICD-10-CM | POA: Diagnosis present

## 2019-01-18 DIAGNOSIS — M4712 Other spondylosis with myelopathy, cervical region: Principal | ICD-10-CM | POA: Diagnosis present

## 2019-01-18 DIAGNOSIS — D649 Anemia, unspecified: Secondary | ICD-10-CM | POA: Diagnosis not present

## 2019-01-18 HISTORY — PX: POSTERIOR CERVICAL LAMINECTOMY: SHX2248

## 2019-01-18 HISTORY — PX: POSTERIOR CERVICAL FUSION/FORAMINOTOMY: SHX5038

## 2019-01-18 HISTORY — DX: Lumbago with sciatica, right side: M54.41

## 2019-01-18 HISTORY — DX: Secondary polycythemia: D75.1

## 2019-01-18 SURGERY — POSTERIOR CERVICAL LAMINECTOMY
Anesthesia: General | Site: Neck

## 2019-01-18 SURGERY — POSTERIOR CERVICAL FUSION/FORAMINOTOMY LEVEL 1
Anesthesia: General

## 2019-01-18 MED ORDER — LIDOCAINE-EPINEPHRINE 1 %-1:100000 IJ SOLN
INTRAMUSCULAR | Status: AC
  Filled 2019-01-18: qty 1

## 2019-01-18 MED ORDER — SODIUM CHLORIDE 0.9% FLUSH
3.0000 mL | Freq: Two times a day (BID) | INTRAVENOUS | Status: DC
  Administered 2019-01-18: 3 mL via INTRAVENOUS

## 2019-01-18 MED ORDER — HYDROCODONE-ACETAMINOPHEN 5-325 MG PO TABS
ORAL_TABLET | ORAL | Status: AC
  Filled 2019-01-18: qty 1

## 2019-01-18 MED ORDER — POLYETHYLENE GLYCOL 3350 17 G PO PACK: 17.0000 g | PACK | Freq: Every day | ORAL | Status: DC | PRN

## 2019-01-18 MED ORDER — LATANOPROST 0.005 % OP SOLN
1.0000 [drp] | Freq: Every day | OPHTHALMIC | Status: DC
  Administered 2019-01-19 – 2019-01-20 (×2): 1 [drp] via OPHTHALMIC
  Filled 2019-01-18: qty 2.5

## 2019-01-18 MED ORDER — LIDOCAINE-EPINEPHRINE 1 %-1:100000 IJ SOLN
INTRAMUSCULAR | Status: DC | PRN
  Administered 2019-01-18: 5 mL

## 2019-01-18 MED ORDER — ACETAMINOPHEN 500 MG PO TABS
1000.0000 mg | ORAL_TABLET | Freq: Once | ORAL | Status: AC
  Administered 2019-01-18: 1000 mg via ORAL
  Filled 2019-01-18: qty 2

## 2019-01-18 MED ORDER — THYROID 30 MG PO TABS
30.0000 mg | ORAL_TABLET | Freq: Every day | ORAL | Status: DC
  Administered 2019-01-19 – 2019-01-21 (×3): 30 mg via ORAL
  Filled 2019-01-18 (×5): qty 1

## 2019-01-18 MED ORDER — ARTIFICIAL TEARS OPHTHALMIC OINT
TOPICAL_OINTMENT | OPHTHALMIC | Status: AC
  Filled 2019-01-18: qty 3.5

## 2019-01-18 MED ORDER — METHYLPREDNISOLONE ACETATE 80 MG/ML IJ SUSP
INTRAMUSCULAR | Status: AC
  Filled 2019-01-18: qty 1

## 2019-01-18 MED ORDER — LACTATED RINGERS IV SOLN
INTRAVENOUS | Status: DC
  Administered 2019-01-18: 10:00:00 via INTRAVENOUS

## 2019-01-18 MED ORDER — DEXAMETHASONE SODIUM PHOSPHATE 10 MG/ML IJ SOLN
INTRAMUSCULAR | Status: AC
  Filled 2019-01-18: qty 1

## 2019-01-18 MED ORDER — ROCURONIUM BROMIDE 10 MG/ML (PF) SYRINGE
PREFILLED_SYRINGE | INTRAVENOUS | Status: DC | PRN
  Administered 2019-01-18: 50 mg via INTRAVENOUS
  Administered 2019-01-18: 20 mg via INTRAVENOUS

## 2019-01-18 MED ORDER — PROPOFOL 10 MG/ML IV BOLUS
INTRAVENOUS | Status: DC | PRN
  Administered 2019-01-18: 50 mg via INTRAVENOUS
  Administered 2019-01-18: 100 mg via INTRAVENOUS
  Administered 2019-01-18: 20 mg via INTRAVENOUS
  Administered 2019-01-18: 30 mg via INTRAVENOUS

## 2019-01-18 MED ORDER — LACTATED RINGERS IV SOLN: INTRAVENOUS | Status: DC

## 2019-01-18 MED ORDER — EPHEDRINE 5 MG/ML INJ
INTRAVENOUS | Status: AC
  Filled 2019-01-18: qty 10

## 2019-01-18 MED ORDER — ROCURONIUM BROMIDE 100 MG/10ML IV SOLN
INTRAVENOUS | Status: DC | PRN
  Administered 2019-01-18: 50 mg via INTRAVENOUS

## 2019-01-18 MED ORDER — SUGAMMADEX SODIUM 200 MG/2ML IV SOLN
INTRAVENOUS | Status: DC | PRN
  Administered 2019-01-18: 115.2 mg via INTRAVENOUS

## 2019-01-18 MED ORDER — DOCUSATE SODIUM 100 MG PO CAPS
100.0000 mg | ORAL_CAPSULE | Freq: Two times a day (BID) | ORAL | Status: DC
  Administered 2019-01-18 – 2019-01-21 (×6): 100 mg via ORAL
  Filled 2019-01-18 (×6): qty 1

## 2019-01-18 MED ORDER — SUGAMMADEX SODIUM 500 MG/5ML IV SOLN
INTRAVENOUS | Status: DC | PRN
  Administered 2019-01-18: 300 mg via INTRAVENOUS
  Administered 2019-01-18: 100 mg via INTRAVENOUS

## 2019-01-18 MED ORDER — BISACODYL 10 MG RE SUPP: 10.0000 mg | Freq: Every day | RECTAL | Status: DC | PRN

## 2019-01-18 MED ORDER — LACTATED RINGERS IV SOLN
INTRAVENOUS | Status: DC | PRN
  Administered 2019-01-18 (×2): via INTRAVENOUS

## 2019-01-18 MED ORDER — SUGAMMADEX SODIUM 500 MG/5ML IV SOLN
INTRAVENOUS | Status: AC
  Filled 2019-01-18: qty 5

## 2019-01-18 MED ORDER — ROCURONIUM BROMIDE 10 MG/ML (PF) SYRINGE
PREFILLED_SYRINGE | INTRAVENOUS | Status: AC
  Filled 2019-01-18: qty 10

## 2019-01-18 MED ORDER — MENTHOL 3 MG MT LOZG: 1.0000 | LOZENGE | OROMUCOSAL | Status: DC | PRN

## 2019-01-18 MED ORDER — ONDANSETRON HCL 4 MG/2ML IJ SOLN
4.0000 mg | Freq: Four times a day (QID) | INTRAMUSCULAR | Status: DC | PRN
  Administered 2019-01-19: 4 mg via INTRAVENOUS
  Filled 2019-01-18: qty 2

## 2019-01-18 MED ORDER — BACITRACIN ZINC 500 UNIT/GM EX OINT
TOPICAL_OINTMENT | CUTANEOUS | Status: DC | PRN
  Administered 2019-01-18: 1 via TOPICAL

## 2019-01-18 MED ORDER — SODIUM CHLORIDE 0.9% FLUSH: 3.0000 mL | INTRAVENOUS | Status: DC | PRN

## 2019-01-18 MED ORDER — SUCCINYLCHOLINE CHLORIDE 20 MG/ML IJ SOLN
INTRAMUSCULAR | Status: DC | PRN
  Administered 2019-01-18: 100 mg via INTRAVENOUS

## 2019-01-18 MED ORDER — LIDOCAINE 2% (20 MG/ML) 5 ML SYRINGE
INTRAMUSCULAR | Status: AC
  Filled 2019-01-18: qty 5

## 2019-01-18 MED ORDER — FENTANYL CITRATE (PF) 250 MCG/5ML IJ SOLN
INTRAMUSCULAR | Status: DC | PRN
  Administered 2019-01-18 (×2): 50 ug via INTRAVENOUS

## 2019-01-18 MED ORDER — NALOXONE HCL 0.4 MG/ML IJ SOLN
INTRAMUSCULAR | Status: DC | PRN
  Administered 2019-01-18 (×2): 40 ug via INTRAVENOUS

## 2019-01-18 MED ORDER — PHENYLEPHRINE HCL (PRESSORS) 10 MG/ML IV SOLN
INTRAVENOUS | Status: DC | PRN
  Administered 2019-01-18: 80 ug via INTRAVENOUS

## 2019-01-18 MED ORDER — LISINOPRIL 10 MG PO TABS
10.0000 mg | ORAL_TABLET | Freq: Every morning | ORAL | Status: DC
  Administered 2019-01-19 – 2019-01-21 (×3): 10 mg via ORAL
  Filled 2019-01-18 (×3): qty 1

## 2019-01-18 MED ORDER — THROMBIN 5000 UNITS EX SOLR
CUTANEOUS | Status: DC | PRN
  Administered 2019-01-18 (×2): 5000 [IU] via TOPICAL

## 2019-01-18 MED ORDER — SUCCINYLCHOLINE CHLORIDE 200 MG/10ML IV SOSY
PREFILLED_SYRINGE | INTRAVENOUS | Status: AC
  Filled 2019-01-18: qty 10

## 2019-01-18 MED ORDER — DEXAMETHASONE SODIUM PHOSPHATE 10 MG/ML IJ SOLN
INTRAMUSCULAR | Status: DC | PRN
  Administered 2019-01-18: 10 mg via INTRAVENOUS

## 2019-01-18 MED ORDER — ONDANSETRON HCL 4 MG/2ML IJ SOLN
INTRAMUSCULAR | Status: DC | PRN
  Administered 2019-01-18: 4 mg via INTRAVENOUS

## 2019-01-18 MED ORDER — PROPOFOL 10 MG/ML IV BOLUS
INTRAVENOUS | Status: DC | PRN
  Administered 2019-01-18: 40 mg via INTRAVENOUS
  Administered 2019-01-18: 120 mg via INTRAVENOUS

## 2019-01-18 MED ORDER — PHENYLEPHRINE HCL-NACL 10-0.9 MG/250ML-% IV SOLN
INTRAVENOUS | Status: DC | PRN
  Administered 2019-01-18: 50 ug/min via INTRAVENOUS

## 2019-01-18 MED ORDER — SODIUM CHLORIDE (PF) 0.9 % IJ SOLN
INTRAMUSCULAR | Status: AC
  Filled 2019-01-18: qty 10

## 2019-01-18 MED ORDER — HEMOSTATIC AGENTS (NO CHARGE) OPTIME
TOPICAL | Status: DC | PRN
  Administered 2019-01-18: 1 via TOPICAL

## 2019-01-18 MED ORDER — FENTANYL CITRATE (PF) 100 MCG/2ML IJ SOLN
INTRAMUSCULAR | Status: DC | PRN
  Administered 2019-01-18: 50 ug via INTRAVENOUS
  Administered 2019-01-18: 100 ug via INTRAVENOUS

## 2019-01-18 MED ORDER — BACITRACIN ZINC 500 UNIT/GM EX OINT
TOPICAL_OINTMENT | CUTANEOUS | Status: AC
  Filled 2019-01-18: qty 28.35

## 2019-01-18 MED ORDER — EPHEDRINE SULFATE 50 MG/ML IJ SOLN
INTRAMUSCULAR | Status: DC | PRN
  Administered 2019-01-18: 5 mg via INTRAVENOUS

## 2019-01-18 MED ORDER — LIDOCAINE HCL (CARDIAC) PF 100 MG/5ML IV SOSY
PREFILLED_SYRINGE | INTRAVENOUS | Status: DC | PRN
  Administered 2019-01-18: 60 mg via INTRATRACHEAL

## 2019-01-18 MED ORDER — CHLORHEXIDINE GLUCONATE CLOTH 2 % EX PADS: 6.0000 | MEDICATED_PAD | Freq: Once | CUTANEOUS | Status: DC

## 2019-01-18 MED ORDER — PHENOL 1.4 % MT LIQD: 1.0000 | OROMUCOSAL | Status: DC | PRN

## 2019-01-18 MED ORDER — PROPOFOL 10 MG/ML IV BOLUS
INTRAVENOUS | Status: AC
  Filled 2019-01-18: qty 20

## 2019-01-18 MED ORDER — THROMBIN 5000 UNITS EX SOLR
CUTANEOUS | Status: AC
  Filled 2019-01-18: qty 5000

## 2019-01-18 MED ORDER — ONDANSETRON HCL 4 MG PO TABS: 4.0000 mg | ORAL_TABLET | Freq: Four times a day (QID) | ORAL | Status: DC | PRN

## 2019-01-18 MED ORDER — FENTANYL CITRATE (PF) 100 MCG/2ML IJ SOLN: 25.0000 ug | INTRAMUSCULAR | Status: DC | PRN

## 2019-01-18 MED ORDER — THROMBIN 5000 UNITS EX SOLR
OROMUCOSAL | Status: DC | PRN
  Administered 2019-01-18: 5 mL via TOPICAL

## 2019-01-18 MED ORDER — THROMBIN 5000 UNITS EX SOLR
CUTANEOUS | Status: DC | PRN
  Administered 2019-01-18: 5000 [IU] via TOPICAL

## 2019-01-18 MED ORDER — HYDROCODONE-ACETAMINOPHEN 5-325 MG PO TABS
1.0000 | ORAL_TABLET | ORAL | Status: DC | PRN
  Administered 2019-01-18 – 2019-01-19 (×3): 1 via ORAL
  Administered 2019-01-20: 17:00:00 2 via ORAL
  Administered 2019-01-20: 03:00:00 1 via ORAL
  Administered 2019-01-21 (×2): 2 via ORAL
  Filled 2019-01-18: qty 1
  Filled 2019-01-18 (×2): qty 2
  Filled 2019-01-18: qty 1
  Filled 2019-01-18: qty 2
  Filled 2019-01-18: qty 1

## 2019-01-18 MED ORDER — FENTANYL CITRATE (PF) 250 MCG/5ML IJ SOLN
INTRAMUSCULAR | Status: AC
  Filled 2019-01-18: qty 5

## 2019-01-18 MED ORDER — CEFAZOLIN SODIUM-DEXTROSE 2-4 GM/100ML-% IV SOLN
2.0000 g | INTRAVENOUS | Status: AC
  Administered 2019-01-18: 2 g via INTRAVENOUS
  Filled 2019-01-18: qty 100

## 2019-01-18 MED ORDER — MORPHINE SULFATE (PF) 2 MG/ML IV SOLN
2.0000 mg | INTRAVENOUS | Status: DC | PRN
  Administered 2019-01-18: 2 mg via INTRAVENOUS
  Filled 2019-01-18: qty 1

## 2019-01-18 MED ORDER — ACETAMINOPHEN 325 MG PO TABS: 650.0000 mg | ORAL_TABLET | ORAL | Status: DC | PRN

## 2019-01-18 MED ORDER — LISINOPRIL 10 MG PO TABS
10.0000 mg | ORAL_TABLET | Freq: Once | ORAL | Status: AC
  Administered 2019-01-18: 10 mg via ORAL
  Filled 2019-01-18: qty 1

## 2019-01-18 MED ORDER — THROMBIN 5000 UNITS EX SOLR
CUTANEOUS | Status: AC
  Filled 2019-01-18: qty 15000

## 2019-01-18 MED ORDER — METHOCARBAMOL 500 MG PO TABS: 500.0000 mg | ORAL_TABLET | Freq: Four times a day (QID) | ORAL | Status: DC | PRN

## 2019-01-18 MED ORDER — METHOCARBAMOL 1000 MG/10ML IJ SOLN
500.0000 mg | Freq: Four times a day (QID) | INTRAVENOUS | Status: DC | PRN
  Filled 2019-01-18 (×4): qty 5

## 2019-01-18 MED ORDER — SODIUM CHLORIDE 0.9 % IV SOLN
INTRAVENOUS | Status: DC | PRN
  Administered 2019-01-18: 500 mL

## 2019-01-18 MED ORDER — ACETAMINOPHEN 650 MG RE SUPP: 650.0000 mg | RECTAL | Status: DC | PRN

## 2019-01-18 MED ORDER — PHENYLEPHRINE HCL-NACL 10-0.9 MG/250ML-% IV SOLN
INTRAVENOUS | Status: DC | PRN
  Administered 2019-01-18: 25 ug/min via INTRAVENOUS

## 2019-01-18 MED ORDER — FLEET ENEMA 7-19 GM/118ML RE ENEM: 1.0000 | ENEMA | Freq: Once | RECTAL | Status: DC | PRN

## 2019-01-18 MED ORDER — CEFAZOLIN SODIUM-DEXTROSE 2-4 GM/100ML-% IV SOLN
2.0000 g | Freq: Three times a day (TID) | INTRAVENOUS | Status: AC
  Administered 2019-01-18 – 2019-01-19 (×2): 2 g via INTRAVENOUS
  Filled 2019-01-18 (×2): qty 100

## 2019-01-18 MED ORDER — 0.9 % SODIUM CHLORIDE (POUR BTL) OPTIME
TOPICAL | Status: DC | PRN
  Administered 2019-01-18: 12:00:00 1000 mL

## 2019-01-18 MED ORDER — ONDANSETRON HCL 4 MG/2ML IJ SOLN
INTRAMUSCULAR | Status: AC
  Filled 2019-01-18: qty 2

## 2019-01-18 MED ORDER — PHENYLEPHRINE 40 MCG/ML (10ML) SYRINGE FOR IV PUSH (FOR BLOOD PRESSURE SUPPORT)
PREFILLED_SYRINGE | INTRAVENOUS | Status: DC | PRN
  Administered 2019-01-18: 80 ug via INTRAVENOUS

## 2019-01-18 MED ORDER — DORZOLAMIDE HCL-TIMOLOL MAL 2-0.5 % OP SOLN
1.0000 [drp] | Freq: Two times a day (BID) | OPHTHALMIC | Status: DC
  Administered 2019-01-19 – 2019-01-21 (×5): 1 [drp] via OPHTHALMIC
  Filled 2019-01-18: qty 10

## 2019-01-18 MED ORDER — 0.9 % SODIUM CHLORIDE (POUR BTL) OPTIME
TOPICAL | Status: DC | PRN
  Administered 2019-01-18: 1000 mL

## 2019-01-18 MED ORDER — SENNA 8.6 MG PO TABS
1.0000 | ORAL_TABLET | Freq: Two times a day (BID) | ORAL | Status: DC
  Administered 2019-01-18 – 2019-01-21 (×6): 8.6 mg via ORAL
  Filled 2019-01-18 (×6): qty 1

## 2019-01-18 MED ORDER — BUPIVACAINE HCL (PF) 0.5 % IJ SOLN
INTRAMUSCULAR | Status: AC
  Filled 2019-01-18: qty 30

## 2019-01-18 MED ORDER — ONDANSETRON HCL 4 MG/2ML IJ SOLN: 4.0000 mg | Freq: Once | INTRAMUSCULAR | Status: DC | PRN

## 2019-01-18 MED ORDER — LIDOCAINE 2% (20 MG/ML) 5 ML SYRINGE
INTRAMUSCULAR | Status: DC | PRN
  Administered 2019-01-18: 60 mg via INTRAVENOUS

## 2019-01-18 MED ORDER — PHENYLEPHRINE 40 MCG/ML (10ML) SYRINGE FOR IV PUSH (FOR BLOOD PRESSURE SUPPORT)
PREFILLED_SYRINGE | INTRAVENOUS | Status: AC
  Filled 2019-01-18: qty 10

## 2019-01-18 MED ORDER — THROMBIN 5000 UNITS EX SOLR
CUTANEOUS | Status: AC
  Filled 2019-01-18: qty 10000

## 2019-01-18 MED ORDER — BUPIVACAINE HCL 0.5 % IJ SOLN
INTRAMUSCULAR | Status: DC | PRN
  Administered 2019-01-18: 5 mL

## 2019-01-18 SURGICAL SUPPLY — 64 items
BAG DECANTER FOR FLEXI CONT (MISCELLANEOUS) ×2 IMPLANT
BENZOIN TINCTURE PRP APPL 2/3 (GAUZE/BANDAGES/DRESSINGS) IMPLANT
BIT DRILL NEURO 2X3.1 SFT TUCH (MISCELLANEOUS) ×1 IMPLANT
BLADE CLIPPER SURG (BLADE) IMPLANT
BLADE ULTRA TIP 2M (BLADE) IMPLANT
CANISTER SUCT 3000ML PPV (MISCELLANEOUS) ×2 IMPLANT
COVER WAND RF STERILE (DRAPES) ×2 IMPLANT
DECANTER SPIKE VIAL GLASS SM (MISCELLANEOUS) ×2 IMPLANT
DERMABOND ADVANCED (GAUZE/BANDAGES/DRESSINGS) ×1
DERMABOND ADVANCED .7 DNX12 (GAUZE/BANDAGES/DRESSINGS) IMPLANT
DEVICE DISSECT PLASMABLAD 3.0S (MISCELLANEOUS) ×1 IMPLANT
DRAPE C-ARM 42X72 X-RAY (DRAPES) ×2 IMPLANT
DRAPE LAPAROTOMY 100X72 PEDS (DRAPES) ×2 IMPLANT
DRAPE MICROSCOPE LEICA (MISCELLANEOUS) IMPLANT
DRILL NEURO 2X3.1 SOFT TOUCH (MISCELLANEOUS) ×2
DURAPREP 26ML APPLICATOR (WOUND CARE) ×2 IMPLANT
ELECT REM PT RETURN 9FT ADLT (ELECTROSURGICAL) ×2
ELECTRODE REM PT RTRN 9FT ADLT (ELECTROSURGICAL) ×1 IMPLANT
GAUZE 4X4 16PLY RFD (DISPOSABLE) IMPLANT
GAUZE SPONGE 4X4 12PLY STRL (GAUZE/BANDAGES/DRESSINGS) IMPLANT
GLOVE BIOGEL PI IND STRL 7.5 (GLOVE) IMPLANT
GLOVE BIOGEL PI IND STRL 8 (GLOVE) IMPLANT
GLOVE BIOGEL PI IND STRL 8.5 (GLOVE) ×1 IMPLANT
GLOVE BIOGEL PI INDICATOR 7.5 (GLOVE) ×1
GLOVE BIOGEL PI INDICATOR 8 (GLOVE) ×1
GLOVE BIOGEL PI INDICATOR 8.5 (GLOVE) ×1
GLOVE ECLIPSE 7.5 STRL STRAW (GLOVE) ×2 IMPLANT
GLOVE ECLIPSE 8.5 STRL (GLOVE) ×2 IMPLANT
GLOVE EXAM NITRILE XL STR (GLOVE) IMPLANT
GLOVE INDICATOR 7.5 STRL GRN (GLOVE) ×1 IMPLANT
GLOVE SURG SS PI 8.5 STRL IVOR (GLOVE) ×1
GLOVE SURG SS PI 8.5 STRL STRW (GLOVE) IMPLANT
GOWN STRL REUS W/ TWL LRG LVL3 (GOWN DISPOSABLE) IMPLANT
GOWN STRL REUS W/ TWL XL LVL3 (GOWN DISPOSABLE) ×1 IMPLANT
GOWN STRL REUS W/TWL 2XL LVL3 (GOWN DISPOSABLE) ×2 IMPLANT
GOWN STRL REUS W/TWL LRG LVL3 (GOWN DISPOSABLE)
GOWN STRL REUS W/TWL XL LVL3 (GOWN DISPOSABLE) ×1
HEMOSTAT POWDER KIT SURGIFOAM (HEMOSTASIS) ×2 IMPLANT
HEMOSTAT SURGICEL 2X14 (HEMOSTASIS) IMPLANT
KIT BASIN OR (CUSTOM PROCEDURE TRAY) ×2 IMPLANT
KIT TURNOVER KIT B (KITS) ×2 IMPLANT
NDL SPNL 18GX3.5 QUINCKE PK (NEEDLE) IMPLANT
NEEDLE HYPO 22GX1.5 SAFETY (NEEDLE) ×2 IMPLANT
NEEDLE SPNL 18GX3.5 QUINCKE PK (NEEDLE) IMPLANT
NS IRRIG 1000ML POUR BTL (IV SOLUTION) ×2 IMPLANT
PACK LAMINECTOMY NEURO (CUSTOM PROCEDURE TRAY) ×2 IMPLANT
PAD ARMBOARD 7.5X6 YLW CONV (MISCELLANEOUS) ×6 IMPLANT
PATTIES SURGICAL .25X.25 (GAUZE/BANDAGES/DRESSINGS) IMPLANT
PIN MAYFIELD SKULL DISP (PIN) ×2 IMPLANT
PLASMABLADE 3.0S (MISCELLANEOUS) ×2
RUBBERBAND STERILE (MISCELLANEOUS) IMPLANT
SPONGE LAP 4X18 RFD (DISPOSABLE) IMPLANT
STAPLER SKIN PROX WIDE 3.9 (STAPLE) ×2 IMPLANT
STRIP CLOSURE SKIN 1/2X4 (GAUZE/BANDAGES/DRESSINGS) IMPLANT
SUT ETHILON 3 0 FSL (SUTURE) IMPLANT
SUT MON AB 4-0 RB1 27 (SUTURE) ×1 IMPLANT
SUT VIC AB 0 CT1 18XCR BRD8 (SUTURE) ×1 IMPLANT
SUT VIC AB 0 CT1 8-18 (SUTURE) ×1
SUT VIC AB 2-0 CP2 18 (SUTURE) ×2 IMPLANT
SUT VIC AB 3-0 SH 8-18 (SUTURE) ×1 IMPLANT
TOWEL GREEN STERILE (TOWEL DISPOSABLE) ×2 IMPLANT
TOWEL GREEN STERILE FF (TOWEL DISPOSABLE) ×2 IMPLANT
TRAY FOLEY MTR SLVR 16FR STAT (SET/KITS/TRAYS/PACK) IMPLANT
WATER STERILE IRR 1000ML POUR (IV SOLUTION) ×2 IMPLANT

## 2019-01-18 SURGICAL SUPPLY — 56 items
BIT DRILL NEURO 2X3.1 SFT TUCH (MISCELLANEOUS) IMPLANT
BLADE CLIPPER SURG (BLADE) IMPLANT
BLADE SURG 11 STRL SS (BLADE) ×2 IMPLANT
BLADE ULTRA TIP 2M (BLADE) IMPLANT
CANISTER SUCT 3000ML PPV (MISCELLANEOUS) ×2 IMPLANT
CARTRIDGE OIL MAESTRO DRILL (MISCELLANEOUS) ×1 IMPLANT
COVER WAND RF STERILE (DRAPES) ×2 IMPLANT
DECANTER SPIKE VIAL GLASS SM (MISCELLANEOUS) ×2 IMPLANT
DIFFUSER DRILL AIR PNEUMATIC (MISCELLANEOUS) ×2 IMPLANT
DRAPE LAPAROTOMY 100X72 PEDS (DRAPES) ×2 IMPLANT
DRAPE MICROSCOPE LEICA (MISCELLANEOUS) ×2 IMPLANT
DRILL NEURO 2X3.1 SOFT TOUCH (MISCELLANEOUS)
DURAPREP 6ML APPLICATOR 50/CS (WOUND CARE) ×2 IMPLANT
ELECT REM PT RETURN 9FT ADLT (ELECTROSURGICAL) ×2
ELECTRODE REM PT RTRN 9FT ADLT (ELECTROSURGICAL) ×1 IMPLANT
GAUZE 4X4 16PLY RFD (DISPOSABLE) IMPLANT
GAUZE SPONGE 4X4 12PLY STRL (GAUZE/BANDAGES/DRESSINGS) ×2 IMPLANT
GLOVE BIO SURGEON STRL SZ 6.5 (GLOVE) ×1 IMPLANT
GLOVE BIO SURGEON STRL SZ7 (GLOVE) ×2 IMPLANT
GLOVE BIO SURGEON STRL SZ8 (GLOVE) ×2 IMPLANT
GLOVE BIOGEL PI IND STRL 8 (GLOVE) ×1 IMPLANT
GLOVE BIOGEL PI IND STRL 8.5 (GLOVE) ×1 IMPLANT
GLOVE BIOGEL PI INDICATOR 8 (GLOVE) ×1
GLOVE BIOGEL PI INDICATOR 8.5 (GLOVE) ×2
GLOVE ECLIPSE 8.0 STRL XLNG CF (GLOVE) ×2 IMPLANT
GLOVE ECLIPSE 8.5 STRL (GLOVE) ×1 IMPLANT
GLOVE EXAM NITRILE XL STR (GLOVE) IMPLANT
GOWN STRL REUS W/ TWL LRG LVL3 (GOWN DISPOSABLE) IMPLANT
GOWN STRL REUS W/ TWL XL LVL3 (GOWN DISPOSABLE) IMPLANT
GOWN STRL REUS W/TWL 2XL LVL3 (GOWN DISPOSABLE) IMPLANT
GOWN STRL REUS W/TWL LRG LVL3 (GOWN DISPOSABLE) ×2
GOWN STRL REUS W/TWL XL LVL3 (GOWN DISPOSABLE) ×1
HEMOSTAT SURGICEL 2X14 (HEMOSTASIS) IMPLANT
KIT BASIN OR (CUSTOM PROCEDURE TRAY) ×2 IMPLANT
KIT TURNOVER KIT B (KITS) ×2 IMPLANT
MARKER SKIN DUAL TIP RULER LAB (MISCELLANEOUS) ×2 IMPLANT
NDL HYPO 25X1 1.5 SAFETY (NEEDLE) ×1 IMPLANT
NDL SPNL 22GX3.5 QUINCKE BK (NEEDLE) ×1 IMPLANT
NEEDLE HYPO 25X1 1.5 SAFETY (NEEDLE) ×2 IMPLANT
NEEDLE SPNL 22GX3.5 QUINCKE BK (NEEDLE) ×2 IMPLANT
NS IRRIG 1000ML POUR BTL (IV SOLUTION) ×2 IMPLANT
OIL CARTRIDGE MAESTRO DRILL (MISCELLANEOUS) ×2
PACK LAMINECTOMY NEURO (CUSTOM PROCEDURE TRAY) ×2 IMPLANT
RUBBERBAND STERILE (MISCELLANEOUS) ×4 IMPLANT
SPONGE SURGIFOAM ABS GEL SZ50 (HEMOSTASIS) ×2 IMPLANT
STAPLER SKIN PROX WIDE 3.9 (STAPLE) ×2 IMPLANT
SUT ETHILON 3 0 FSL (SUTURE) ×1 IMPLANT
SUT VIC AB 0 CT1 18XCR BRD8 (SUTURE) ×1 IMPLANT
SUT VIC AB 0 CT1 8-18 (SUTURE) ×1
SUT VIC AB 2-0 CT1 18 (SUTURE) ×2 IMPLANT
SUT VIC AB 3-0 SH 8-18 (SUTURE) ×1 IMPLANT
TAPE CLOTH SURG 4X10 WHT LF (GAUZE/BANDAGES/DRESSINGS) ×1 IMPLANT
TOWEL GREEN STERILE (TOWEL DISPOSABLE) ×2 IMPLANT
TOWEL GREEN STERILE FF (TOWEL DISPOSABLE) ×2 IMPLANT
UNDERPAD 30X30 (UNDERPADS AND DIAPERS) ×2 IMPLANT
WATER STERILE IRR 1000ML POUR (IV SOLUTION) ×2 IMPLANT

## 2019-01-18 NOTE — Progress Notes (Signed)
Patient ID: Gabrielle Donovan, female   DOB: 09/06/1945, 73 y.o.   MRN: [id number] I was contacted this evening a little after 730 that the patient was not moving her left side in the arm or in the leg and she was flaccid a CT scan was ordered and being performed at the time.  I the opportunity to review it I am not convinced that there is an epidural hematoma however in light of the patient's acute deterioration we are compelled to return to the operating room for surgical exploration of the wound.  I have contacted Garvin Fila the patient's son to explain to him the nature of what is occurring and will be in contact with him as soon as the surgery is over to let him know what we found.  I did examine at he just a few minutes ago and noted that she is in the plegic on the left arm and the left leg with minimal tone.  She does have good sensation distally and proximally in the upper and lower extremities but she has no motor function.  She has signed a consent for reexploration.

## 2019-01-18 NOTE — Anesthesia Procedure Notes (Signed)
Procedure Name: Intubation Date/Time: 01/18/2019 11:52 AM Performed by: Lowella Dell, CRNA Pre-anesthesia Checklist: Patient identified, Emergency Drugs available, Suction available and Patient being monitored Patient Re-evaluated:Patient Re-evaluated prior to induction Oxygen Delivery Method: Circle System Utilized Preoxygenation: Pre-oxygenation with 100% oxygen Induction Type: IV induction Ventilation: Mask ventilation without difficulty Laryngoscope Size: Glidescope and 3 Tube type: Oral Tube size: 7.0 mm Number of attempts: 1 Airway Equipment and Method: Video-laryngoscopy and Rigid stylet Placement Confirmation: ETT inserted through vocal cords under direct vision,  positive ETCO2 and breath sounds checked- equal and bilateral Secured at: 21 cm Tube secured with: Tape Dental Injury: Teeth and Oropharynx as per pre-operative assessment  Comments: Elective glidescope d/t cervical symptoms

## 2019-01-18 NOTE — H&P (Signed)
CHIEF COMPLAINT: Weakness of the left arm.  HISTORY OF PRESENT ILLNESS: Gabrielle Donovan is a 73 year old left-handed individual who tells me that about 2 months ago she started to rather insidiously developed some weakness in the left arm.  She feels that it may have been progressing some and she was seen by her primary care physician, Dr. Judd Lien, who ultimately ordered an MRI of the brain, concerned that she may have had a stroke.  The MRI of the brain shows that her intracranial contents are completely within the limits of normal.  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  This is poorly defined, as the MRI focuses primarily on the brain, but it is visible in a couple of sequences.  The cyst appears to be eccentric to the left side.     Today in the office to further her workup, I obtained a series of plain x-rays, which demonstrate that Gabrielle Donovan has normal looking cervical spine with approximately 3 mm of anterolisthesis of the ring of C1 on C2, but no abnormal motion between flexion and extension.  The oblique views demonstrate that the bony foramina appear to be amply patent.  The odontoid view appears normal.   For her part, Gabrielle Donovan notes that she does not have any neck pain.  She has not had any pain at all in the arm or the legs.  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  PAST MEDICAL HISTORY: Reveals that her general health has been good.  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  CURRENT MEDICATIONS: Lisinopril, a baby Aspirin, some thyroid supplementation, and Hydroxyurea for her bone marrow supplementation.  PHYSICAL EXAM: Revealed  that she stands straight and erect.  She walks with a moderately widened-based gait and has some scissoring in her gait.  In her upper extremities, I note that she has weakness in the left arm, being rated at 4-/5.  The right hand has good strength, tone and bulk.  She had some atrophy in the intrinsic muscles of the left hand also that is evident.  She has absent reflexes in the biceps and triceps and 3+ reflexes in the patellae and the Achilles.  Babinski reflex is equivocal bilaterally.  Cranial nerve examination appears normal.  Station appears intact.  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  I note that there is some cord compression in this region.  Clinically, Gabrielle Donovan has had weakness in the left arm develop, but she has had little in the way of significant neck, shoulder, or arm pain, or other dysesthesias.  The bigger problem has been the progressive weakness.  In regard to this issue, I discussed with Gabrielle Donovan the options for treatment.  Typically, one would consider a posterior decompression and fusion of C1 and C2.  However, in Gabrielle Donovan's situation on the CT scan it would be hard to get good fixation of C1 and C2 with posterior instrumentation.  This may become necessary, but I  believe that Gabrielle Donovan would be well served with a simple posterior decompression of the ring of C1 in an effort to alleviate pressure on the spinal cord in that region.  If that relieves the pressure on the spinal cord and allows her left arm to return to function, then we can simply observe this situation.  Doing a fusion would only become necessary if there is significant instability and we could judge this at the time of the surgery itself, and if so proceed with a fusion.  But, I would hope that we would get by with a simple posterior  decompression of the arch of C1.  I discussed this with Gabrielle Donovan and her daughter today, noting the risks and the potential benefits of doing a simpler operation and the risks associated with the simpler operation, namely the potential for instability to develop.  We will plan the surgery at the earliest convenience for her.  The concern is that she has had progressive worsening of that weakness in that left arm and I am concerned that the longer this persists the more likely permanent injury is to occur.

## 2019-01-18 NOTE — Progress Notes (Signed)
Pt A/Ox4, with complaints "being uncomfortable".  On assessment, pt's left grip absent---- pt unable to move entire LUE but sensation present and normal.  Pt also unable to move LLE--- extremity flaccid but sensation present and normal.  Pt exhibited facial symmetry and follows commands.  On-call provider was notified--- new order for CT Scan of neck received.

## 2019-01-18 NOTE — Progress Notes (Signed)
Mariana Single, PA (on-call provider) here and at pt's bedside to examine pt.  Plan of care was explained to patient.  Pt was brought to CT scan after provider's assessment.

## 2019-01-18 NOTE — Progress Notes (Addendum)
  NEUROSURGERY PROGRESS NOTE   Received call from nursing regarding acute left sided flaccidity noted at shift change. Patient is s/p Cervical laminectomy of C1 with evacuation of synovial cyst C1-C2. Came by immediately to evaluate the patient. Patient with mild LUE weakness prior to surgery (clinic note reads 4-/5)  Awake, alert, oriented LUE: 0/5 diffusely LLE: 2/5 hip, otherwise 0/5 RUE/RLE: normal CN grossly intact Sensation intact to light touch  Stat CT head and C spine ordered. Will await imaging.  Addendum Dr Vertell Limber to take patient emergently back to OR for wound exploration. OR called.  Ferne Reus, PA-C [facility] [employer]

## 2019-01-18 NOTE — Transfer of Care (Signed)
Immediate Anesthesia Transfer of Care Note  Patient: Gabrielle Donovan  Procedure(s) Performed: POSTERIOR CERVICAL WOUND EXPLORATION (N/A Neck)  Patient Location: PACU  Anesthesia Type:General  Level of Consciousness: drowsy  Airway & Oxygen Therapy: Patient Spontanous Breathing and Patient connected to face mask oxygen  Post-op Assessment: Report given to RN and Post -op Vital signs reviewed and stable  Post vital signs: Reviewed and stable  Last Vitals:  Vitals Value Taken Time  BP 140/62 01/18/19 2257  Temp    Pulse 82 01/18/19 2258  Resp 20 01/18/19 2258  SpO2 100 % 01/18/19 2258  Vitals shown include unvalidated device data.  Last Pain:  Vitals:   01/18/19 1930  TempSrc:   PainSc: 2       Patients Stated Pain Goal: 3 ([id number])  Complications: No apparent anesthesia complications

## 2019-01-18 NOTE — Op Note (Signed)
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 Postoperative diagnosis: Same Procedure: Cervical laminectomy of C1 with evacuation of synovial cyst C1-C2 Surgeon: Kristeen Miss First Assistant: Erline Levine, MD Anesthesia: General endotracheal Indications: Gabrielle Donovan is a 73 year old individual who is had significant weakness of the neck and shoulder and arm muscles developing over a number of months she also notes that her gait has deteriorated she was found to have a high-grade compression of the spinal cord at the cervical occipital junction secondary to a large synovial cyst that had formed from the lateral mass joint at C1-C2 this was ventral to the spinal cord more off to the left side.  After evaluating her I advised regarding surgery to do a laminectomy for simple decompression and also possible drainage of the cyst via a lateral approach from the dorsal aspect.  Procedure: The patient was brought to the operating room supine on the stretcher.  After the smooth induction of general endotracheal anesthesia she was placed in the 3 pin headrest and carefully turned prone the bony prominences were appropriately padded and protected.  The back of the neck was shaved to the suboccipital region and prepped with alcohol DuraPrep and draped in a sterile fashion.  A vertical incision was created in the suboccipital region carried down to the cervical dorsal fascia which was opened on either side of midline dissection was carried down to expose the occiput the posterior spinous process of C2 and the posterior arch of C1 the dissection was then carefully taken out to the left side and the right side along C1 and a self-retaining retractor was placed deep in the wound as the lateral edges of the C1 were reached significant venous epidural bleeding was encountered.  This was controlled with some bipolar  cautery and some small pledgets of Gelfoam.  The dissection was undertaken slowly and painstakingly off to the right side then Leksell rongeur was used to remove parts of the arch of C1 and high-speed drill was used to amputate the lateral aspect of the laminar arch of C1.  On the left side the laminar arch was dissected further towards the lateral rim where turned to form the lateral mass.  This allowed for some exploration and retraction of the dura medially and carefully in this region and then probed ventrally and encountered a significant mass which when ruptured released some fluid but also released a considerable amount of grumous material consistent with degenerated synovium.  This was suctioned and carefully evacuated using small ball probes to probe blindly into the ventral aspect of the dura significant quantity of material was removed by suctioning and hemostasis was carefully achieved using a bipolar cautery and some small pledgets of Gelfoam in addition to Surgifoam after several irrigations and no further removal of any debris and good hemostatic control the retractors were removed and the wound was inspected carefully.  Hemostasis in the soft tissues was obtained meticulously and then the cervical dorsal fascia was closed with 2-0 Vicryl in interrupted fashion 3-0 Vicryl was used subcutaneously in 4-0 Vicryl subcuticularly to close the skin blood loss is estimated about 125 cc.  Patient was returned to recovery room in stable condition after being removed from the 3 pin headrest.

## 2019-01-18 NOTE — Op Note (Signed)
Date of surgery: 01/18/2019 Preoperative diagnosis: Acute hemiplegia status post cervical laminectomy C1 for myelopathy Postoperative diagnosis: Same Procedure: Surgical exploration of cervical wound with evacuation of hematoma Surgeon: Kristeen Miss First Assistant: Erline Levine, MD Anesthesia: General endotracheal Indications: Gabrielle Donovan is a 73 year old individual who had a C1 laminectomy today for decompression of a tight stenosis that was causing myelopathy with weakness in the arms and the legs seem to be worse on the left and she had a substantial synovial cyst that was present ventral to the cord at this level.  The cyst itself was drained at the time of surgery she initially woke up with movement of the left side it was only slightly weaker than the right side which had been her preoperative situation.  Then fairly acutely around 730 he was noted that she could not move the left side at all in the arm or in the leg.  The service was summoned and she underwent a CT scan which is inconclusive as the showing any discrete hematoma.  The patient was then taken to the operating room to undergo surgical exploration of the wound.  Procedure: Patient was brought to the operating room supine on a stretcher after the smooth induction of general endotracheal anesthesia a Foley catheter was placed and she was placed in 3 pin headrest and carefully turned prone.  The back of the neck was prepared by removing the bulk of the Dermabond glue using a combination of Vaseline and alcohol to help loosen and undermined glue then after prepping with DuraPrep the wound was draped sterilely and the incision was opened using Metzenbaum scissors to release the subcuticular sutures.  The entire length of the incision was opened and the supratip fascial layers some small areas of blood clot were encountered as the deeper layers were opened there was more profound blood clot that was found dorsal into the left side of the dura  in the cervical spine.  This was evacuated with suction and irrigation several bleeding spots within the muscle and soft tissue were encountered and these were carefully cauterized.  Once the area was decompressed some Surgifoam was placed into the lateral epidural space ventral to the cord where there was some bleeding that was being encountered this gradually became more hemostatic and the wound was carefully evaluated for approximately 20 minutes as small bleeders were cauterized and ultimately was noted that it was quite hemostatic with this a medium size Hemovac drain was placed into the wound deeply and then carefully we closed the cervical dorsal fascia with 2-0 Vicryl in interrupted fashion and 3-0 Vicryl used in the subcuticular tissues surgical staples was used in the skin and ointment was placed on the skin with a dry sterile dressing.  Blood loss for the procedure was estimated about 75 cc.  Patient tolerated procedure well is returned to the recovery room.

## 2019-01-18 NOTE — Anesthesia Procedure Notes (Signed)
Procedure Name: Intubation Date/Time: 01/18/2019 9:30 PM Performed by: Clovis Cao, CRNA Pre-anesthesia Checklist: Patient identified, Emergency Drugs available, Suction available and Patient being monitored Patient Re-evaluated:Patient Re-evaluated prior to induction Oxygen Delivery Method: Circle system utilized Preoxygenation: Pre-oxygenation with 100% oxygen Induction Type: IV induction, Rapid sequence and Cricoid Pressure applied Laryngoscope Size: Glidescope Grade View: Grade I Tube type: Oral Tube size: 7.0 mm Number of attempts: 1 Airway Equipment and Method: Stylet and Video-laryngoscopy Placement Confirmation: ETT inserted through vocal cords under direct vision,  positive ETCO2 and breath sounds checked- equal and bilateral Secured at: 22 cm Tube secured with: Tape Dental Injury: Teeth and Oropharynx as per pre-operative assessment

## 2019-01-18 NOTE — Anesthesia Postprocedure Evaluation (Signed)
Anesthesia Post Note  Patient: ZERIAH LAZOR  Procedure(s) Performed: Posterior decompression ring of Cervical one (N/A )     Patient location during evaluation: PACU Anesthesia Type: General Level of consciousness: awake Pain management: pain level controlled Vital Signs Assessment: post-procedure vital signs reviewed and stable Respiratory status: spontaneous breathing, nonlabored ventilation, respiratory function stable and patient connected to nasal cannula oxygen Cardiovascular status: blood pressure returned to baseline and stable Postop Assessment: no apparent nausea or vomiting Anesthetic complications: no    Last Vitals:  Vitals:   01/18/19 1521 01/18/19 1933  BP: (!) 157/73 (!) 141/63  Pulse: 60 63  Resp: 16 18  Temp: (!) 36.1 C   SpO2:  100%    Last Pain:  Vitals:   01/18/19 1930  TempSrc:   PainSc: 2                  Ryan P Ellender

## 2019-01-18 NOTE — Anesthesia Preprocedure Evaluation (Addendum)
Anesthesia Evaluation  Patient identified by MRN, date of birth, ID band Patient awake    Reviewed: Allergy & Precautions, NPO status , Patient's Chart, lab work & pertinent test results  Airway Mallampati: I  TM Distance: >3 FB Neck ROM: Full    Dental  (+) Edentulous Lower, Edentulous Upper   Pulmonary neg pulmonary ROS,    Pulmonary exam normal breath sounds clear to auscultation       Cardiovascular hypertension, Pt. on medications Normal cardiovascular exam Rhythm:Regular Rate:Normal  ECG: SR, rate 69   Neuro/Psych  Headaches, negative psych ROS   GI/Hepatic negative GI ROS, Neg liver ROS,   Endo/Other  Hypothyroidism   Renal/GU negative Renal ROS     Musculoskeletal negative musculoskeletal ROS (+)   Abdominal   Peds  Hematology  (+) anemia ,   Anesthesia Other Findings Cervical myelopathy  Reproductive/Obstetrics                             Anesthesia Physical  Anesthesia Plan  ASA: II and emergent  Anesthesia Plan: General   Post-op Pain Management:    Induction: Intravenous  PONV Risk Score and Plan: 4 or greater and Midazolam, Dexamethasone, Ondansetron and Treatment may vary due to age or medical condition  Airway Management Planned: Oral ETT and Video Laryngoscope Planned  Additional Equipment:   Intra-op Plan:   Post-operative Plan: Extubation in OR and Possible Post-op intubation/ventilation  Informed Consent: I have reviewed the patients History and Physical, chart, labs and discussed the procedure including the risks, benefits and alternatives for the proposed anesthesia with the patient or authorized representative who has indicated his/her understanding and acceptance.     Dental advisory given  Plan Discussed with: CRNA, Anesthesiologist and Surgeon  Anesthesia Plan Comments: (Per PA-C: Hx of anemia and thrombocytosis (on hydroxyurea). Stable per preop  labs. Copy of CMP from PCP office dated 01/10/19 on pt chart. Reviewed, WNL.  EKG 01/15/19. NSR. Rate 69.)      Anesthesia Quick Evaluation

## 2019-01-18 NOTE — Progress Notes (Signed)
Pt's daughter called and was updated on pt's current condition and plan of care; was also made aware of pt's being transported to the OR at this time.

## 2019-01-18 NOTE — Transfer of Care (Signed)
Immediate Anesthesia Transfer of Care Note  Patient: Gabrielle Donovan  Procedure(s) Performed: Posterior decompression ring of Cervical one (N/A )  Patient Location: PACU  Anesthesia Type:General  Level of Consciousness: awake and patient cooperative  Airway & Oxygen Therapy: Patient Spontanous Breathing and Patient connected to face mask oxygen  Post-op Assessment: Report given to RN, Post -op Vital signs reviewed and stable and Patient moving all extremities X 4  Post vital signs: Reviewed and stable  Last Vitals:  Vitals Value Taken Time  BP 158/74 01/18/19 1331  Temp    Pulse 76 01/18/19 1331  Resp 17 01/18/19 1331  SpO2 100 % 01/18/19 1331  Vitals shown include unvalidated device data.  Last Pain:  Vitals:   01/18/19 0927  TempSrc:   PainSc: 0-No pain         Complications: No apparent anesthesia complications

## 2019-01-18 NOTE — Progress Notes (Signed)
PT Cancellation Note  Patient Details Name: Gabrielle Donovan MRN: [id number] DOB: 10/21/1945   Cancelled Treatment:    Reason Eval/Treat Not Completed: Fatigue/lethargy limiting ability to participate; will attempt another day.   Reginia Naas 01/18/2019, 5:09 PM  Magda Kiel, [place] [phone] 01/18/2019

## 2019-01-19 ENCOUNTER — Encounter (HOSPITAL_COMMUNITY): Payer: Self-pay | Admitting: Neurological Surgery

## 2019-01-19 DIAGNOSIS — Z7409 Other reduced mobility: Secondary | ICD-10-CM

## 2019-01-19 DIAGNOSIS — M4712 Other spondylosis with myelopathy, cervical region: Principal | ICD-10-CM

## 2019-01-19 DIAGNOSIS — R110 Nausea: Secondary | ICD-10-CM

## 2019-01-19 DIAGNOSIS — Z789 Other specified health status: Secondary | ICD-10-CM

## 2019-01-19 LAB — GLUCOSE, CAPILLARY: Glucose-Capillary: 120 mg/dL — ABNORMAL HIGH (ref 70–99)

## 2019-01-19 MED ORDER — ONDANSETRON HCL 4 MG PO TABS: 4.0000 mg | ORAL_TABLET | Freq: Four times a day (QID) | ORAL | Status: DC | PRN

## 2019-01-19 MED ORDER — ONDANSETRON HCL 4 MG/2ML IJ SOLN: 4.0000 mg | Freq: Four times a day (QID) | INTRAMUSCULAR | Status: DC | PRN

## 2019-01-19 MED ORDER — ACETAMINOPHEN 650 MG RE SUPP: 650.0000 mg | RECTAL | Status: DC | PRN

## 2019-01-19 MED ORDER — ACETAMINOPHEN 325 MG PO TABS: 650.0000 mg | ORAL_TABLET | ORAL | Status: DC | PRN

## 2019-01-19 MED ORDER — DEXAMETHASONE 4 MG PO TABS
2.0000 mg | ORAL_TABLET | Freq: Two times a day (BID) | ORAL | Status: DC
  Administered 2019-01-19 – 2019-01-21 (×5): 2 mg via ORAL
  Filled 2019-01-19 (×5): qty 1

## 2019-01-19 MED ORDER — SENNA 8.6 MG PO TABS: 1.0000 | ORAL_TABLET | Freq: Two times a day (BID) | ORAL | Status: DC

## 2019-01-19 MED ORDER — PHENOL 1.4 % MT LIQD: 1.0000 | OROMUCOSAL | Status: DC | PRN

## 2019-01-19 MED ORDER — DOCUSATE SODIUM 100 MG PO CAPS: 100.0000 mg | ORAL_CAPSULE | Freq: Two times a day (BID) | ORAL | Status: DC

## 2019-01-19 MED ORDER — MENTHOL 3 MG MT LOZG: 1.0000 | LOZENGE | OROMUCOSAL | Status: DC | PRN

## 2019-01-19 MED ORDER — SODIUM CHLORIDE 0.9% FLUSH: 3.0000 mL | INTRAVENOUS | Status: DC | PRN

## 2019-01-19 MED ORDER — CHLORHEXIDINE GLUCONATE CLOTH 2 % EX PADS
6.0000 | MEDICATED_PAD | Freq: Every day | CUTANEOUS | Status: DC
  Administered 2019-01-19 – 2019-01-20 (×2): 6 via TOPICAL

## 2019-01-19 MED ORDER — POLYETHYLENE GLYCOL 3350 17 G PO PACK: 17.0000 g | PACK | Freq: Every day | ORAL | Status: DC | PRN

## 2019-01-19 MED ORDER — FLEET ENEMA 7-19 GM/118ML RE ENEM: 1.0000 | ENEMA | Freq: Once | RECTAL | Status: DC | PRN

## 2019-01-19 MED ORDER — SODIUM CHLORIDE 0.9% FLUSH: 3.0000 mL | Freq: Two times a day (BID) | INTRAVENOUS | Status: DC

## 2019-01-19 MED ORDER — CEFAZOLIN SODIUM-DEXTROSE 2-4 GM/100ML-% IV SOLN
2.0000 g | Freq: Three times a day (TID) | INTRAVENOUS | Status: DC
  Filled 2019-01-19: qty 100

## 2019-01-19 MED ORDER — SODIUM CHLORIDE 0.9 % IV SOLN: 250.0000 mL | INTRAVENOUS | Status: DC

## 2019-01-19 MED ORDER — BISACODYL 10 MG RE SUPP: 10.0000 mg | Freq: Every day | RECTAL | Status: DC | PRN

## 2019-01-19 MED ORDER — SODIUM CHLORIDE 0.9 % IV SOLN
INTRAVENOUS | Status: DC | PRN
  Administered 2019-01-19: 03:00:00 250 mL via INTRAVENOUS

## 2019-01-19 NOTE — Consult Note (Signed)
Physical Medicine and Rehabilitation Consult Reason for Consult: Impaired mobility and ADLs secondary to myelopathy s/p C1 leminectomy Referring Physician: Dr. Kristeen Miss   HPI: Gabrielle Donovan is a 73 y.o. female admitted for a C1 laminectomy due to myelopathy with acute hemiparesis post-operatively, s/p return to OR for hematoma evacuation.   Denies pain, constipation. Sleeping well at night. As per son, patient was drowsy during PT session.   She will be living with her daughter post-discharge.    ROS Past Medical History:  Diagnosis Date   Anemia    Arthritis    Cervical myelopathy (HCC)    Diverticulitis    Full dentures    GI bleed    Glaucoma    right eye   Headache    History of blood transfusion    Hypertension    Lumbago with sciatica, right side    treated with ESI   Polycythemia    Thrombocytosis ([place])    Wears glasses    Past Surgical History:  Procedure Laterality Date   ABDOMINAL HYSTERECTOMY     CATARACT EXTRACTION, BILATERAL     FRACTURE SURGERY     right wrist   POSTERIOR CERVICAL FUSION/FORAMINOTOMY N/A 01/18/2019   Procedure: Posterior decompression ring of Cervical one;  Surgeon: Kristeen Miss, MD;  Location: [facility];  Service: Neurosurgery;  Laterality: N/A;   POSTERIOR CERVICAL LAMINECTOMY N/A 01/18/2019   Procedure: POSTERIOR CERVICAL WOUND EXPLORATION;  Surgeon: Kristeen Miss, MD;  Location: [facility];  Service: Neurosurgery;  Laterality: N/A;   TOTAL HIP ARTHROPLASTY Right 07/31/2013   Procedure: RIGHT TOTAL HIP ARTHROPLASTY ANTERIOR APPROACH;  Surgeon: Mauri Pole, MD;  Location: [facility];  Service: Orthopedics;  Laterality: Right;   TUBAL LIGATION     Family History  Problem Relation Age of Onset   Hypertension Mother    Social History:  reports that she has never smoked. She has never used smokeless tobacco. She reports that she does not drink alcohol or use drugs. Allergies:  Allergies  Allergen Reactions    Codeine Nausea And Vomiting   Hydrocodone Nausea And Vomiting   Medications Prior to Admission  Medication Sig Dispense Refill   aspirin EC 81 MG tablet Take 81 mg by mouth daily.     dorzolamide-timolol (COSOPT) 22.3-6.8 MG/ML ophthalmic solution Place 1 drop into the right eye 2 (two) times daily.     folic acid (FOLVITE) 1 MG tablet Take 1 mg by mouth daily.     hydroxyurea (HYDREA) 500 MG capsule Take 500-1,000 mg by mouth See admin instructions. Takes 2 in the morning and 1 in the evening      latanoprost (XALATAN) 0.005 % ophthalmic solution Place 1 drop into the right eye at bedtime.     lisinopril (PRINIVIL,ZESTRIL) 10 MG tablet Take 10 mg by mouth every morning.     Multiple Vitamin (MULTIVITAMIN WITH MINERALS) TABS tablet Take 1 tablet by mouth daily.     NP THYROID 30 MG tablet Take 30 mg by mouth daily before breakfast.      Home: Home Living Family/patient expects to be discharged to:: Private residence Living Arrangements: Alone Available Help at Discharge: Family, Available 24 hours/day Type of Home: House Home Access: Level entry Home Layout: One level Bathroom Shower/Tub: Multimedia programmer: Standard Home Equipment: Environmental consultant - 2 wheels, Cane - single point Additional Comments: pt lives alone in a mobile home with stairs to enter but will D/C to daughters house with level  entry in [place]  Functional History: Prior Function Level of Independence: Independent Comments: walks with a cane outside, cares for herself and drives Functional Status:  Mobility: Bed Mobility Overal bed mobility: Needs Assistance Bed Mobility: Rolling, Sidelying to Sit Rolling: Min guard Sidelying to sit: Min assist General bed mobility comments: pt in recliner upon arrival Transfers Overall transfer level: Needs assistance Transfers: Sit to/from Stand, Stand Pivot Transfers Sit to Stand: Mod assist, Max assist Stand pivot transfers: Mod assist, Max  assist General transfer comment: ModA to maxA to rise from chair and to stand pivot to [facility]. Pt requiring cues to proper technique Ambulation/Gait General Gait Details: unable at this time    ADL: ADL Overall ADL's : Needs assistance/impaired Eating/Feeding: Modified independent, Sitting Grooming: Supervision/safety, Sitting Upper Body Bathing: Supervision/ safety, Sitting Lower Body Bathing: Moderate assistance, Cueing for safety, Cueing for sequencing, Sitting/lateral leans, Sit to/from stand Upper Body Dressing : Supervision/safety, Sitting Lower Body Dressing: Moderate assistance, +2 for physical assistance, Cueing for safety, Cueing for sequencing, Sitting/lateral leans, Sit to/from stand Toilet Transfer: Moderate assistance, Maximal assistance, RW, Regular Toilet, Stand-pivot Toileting- Clothing Manipulation and Hygiene: Moderate assistance, Maximal assistance, Cueing for safety, Sitting/lateral lean, Sit to/from stand Functional mobility during ADLs: Moderate assistance, Maximal assistance, Rolling walker, Cueing for safety, Cueing for sequencing General ADL Comments: Pt very limited by poor strength, decreased coordination and decreased ability to care for self. Pt unable to safely ambulate without modA to maxA with RW and only able to take a few steps.  Cognition: Cognition Overall Cognitive Status: Impaired/Different from baseline Orientation Level: Oriented X4 Cognition Arousal/Alertness: Awake/alert Behavior During Therapy: WFL for tasks assessed/performed Overall Cognitive Status: Impaired/Different from baseline Area of Impairment: Safety/judgement, Awareness Safety/Judgement: Decreased awareness of deficits, Decreased awareness of safety General Comments: pt stating "I can go home after a few days." Although, pt unable to take more than a few steps with RW and modA overall.   Blood pressure 139/66, pulse 68, temperature 99 F (37.2 C), temperature source Oral, resp.  rate 17, height 5' 6.5" (1.689 m), weight 57.6 kg, SpO2 100 %. Physical Exam  Results for orders placed or performed during the hospital encounter of 01/18/19 (from the past 24 hour(s))  Glucose, capillary     Status: Abnormal   Collection Time: 01/19/19  3:13 AM  Result Value Ref Range   Glucose-Capillary 120 (H) 70 - 99 mg/dL   Ct Head Wo Contrast  Result Date: 01/18/2019 CLINICAL DATA:  Initial evaluation for increasing left upper extremity weakness, recent C1 laminectomy with synovial cyst resection. EXAM: CT HEAD WITHOUT CONTRAST TECHNIQUE: Contiguous axial images were obtained from the base of the skull through the vertex without intravenous contrast. COMPARISON:  Previous MRI from 12/13/2018. FINDINGS: Brain: Generalized age-related cerebral atrophy with moderate chronic small vessel ischemic disease, stable. No acute intracranial hemorrhage. No acute large vessel territory infarct. No mass lesion, midline shift or mass effect. No hydrocephalus. No extra-axial fluid collection. Vascular: No hyperdense vessel. Scattered vascular calcifications noted within the carotid siphons. Skull: Postoperative changes from recent C1-C2 laminectomy, better evaluated on concomitant CT of the cervical spine. Associated postoperative soft tissue swelling and emphysema within the suboccipital region and upper posterior neck. Calvarium intact. Small lipoma noted at the left forehead, stable. Sinuses/Orbits: Globes and orbital soft tissues within normal limits. Paranasal sinuses are clear. No mastoid effusion. Other: None. IMPRESSION: 1. No acute intracranial abnormality. 2. Postoperative changes from recent C1 laminectomy with synovial cyst resection, better evaluated on concomitant CT of  the cervical spine. 3. Age-related cerebral atrophy with moderate chronic microvascular ischemic disease, stable. Electronically Signed   By: Jeannine Boga M.D.   On: 01/18/2019 22:05   Ct Cervical Spine Wo  Contrast  Result Date: 01/18/2019 CLINICAL DATA:  Initial evaluation for increasing left upper extremity weakness, history of recent C1 laminectomy with synovial cyst resection. EXAM: CT CERVICAL SPINE WITHOUT CONTRAST TECHNIQUE: Multidetector CT imaging of the cervical spine was performed without intravenous contrast. Multiplanar CT image reconstructions were also generated. COMPARISON:  Comparison made with prior CT and MRI from 12/29/2018. FINDINGS: Alignment: Straightening of the normal cervical lordosis without listhesis or subluxation, stable. No interval malalignment. Skull base and vertebrae: Postoperative changes from interval wide decompressive C1 laminectomy. Normal expected postoperative swelling with emphysema present within the suboccipital region and upper posterior neck about the operative site. No frank soft tissue hematoma or other complication. Previously seen synovial cyst posterior to the dens and tectorial membrane is not definitely seen, consistent with interval resection. Circumferential hyperdense material surrounds the upper spinal cord and thecal sac at the level of the C1 operative site (series 5, image 11). Unclear whether this reflects postoperative change with Gelfoam packing or possibly epidural hemorrhage. Probable residual degenerative synovial pannus thickening at the tectorial membrane contribute somewhat as well. There is resultant moderate to severe spinal stenosis, worse on the left where the thecal sac measures approximately 5 mm in AP diameter. Additional hyperdense material extends inferiorly along the dorsal epidural space, likely a small amount of epidural hemorrhage. This extends to approximately the level of C3-4. Few scattered foci of associated emphysema. Soft tissues and spinal canal: Postoperative changes involving the upper cervical spine as above. Paraspinous soft tissues demonstrate no other significant finding. Prominent atherosclerotic change about the carotid  bifurcations. Disc levels: Moderate multilevel degenerative spondylolysis again noted, unchanged from previous exam. No new or progressive finding. Upper chest: Visualized upper chest demonstrates no acute finding. Other: None. IMPRESSION: 1. Postoperative changes from interval wide decompressive C1 laminectomy and synovial cyst resection. Circumferential hyperdense material surrounds the upper spinal cord and thecal sac at the level of the C1 operative site. Unclear whether this reflects postoperative change with Gelfoam packing or possibly epidural hemorrhage. 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 At time of this dictation, patient is currently in the OR undergoing re-exploration of these findings. 2. Small amount of epidural hemorrhage extending inferiorly along the dorsal epidural space to the level of C3-4. 3. Otherwise stable appearance of the cervical spine with no other acute finding identified. Electronically Signed   By: Jeannine Boga M.D.   On: 01/18/2019 22:21    Physical Exam: General: Alert and oriented x 3, No apparent distress, sitting up in bed with son at bedside.  HEENT: Head is normocephalic, atraumatic, PERRLA, EOMI, sclera anicteric, oral mucosa pink and moist, dentition intact, ext ear canals clear  Neck: Supple without JVD or lymphadenopathy Heart: Reg rate and rhythm. No murmurs rubs or gallops Chest: CTA bilaterally without wheezes, rales, or rhonchi; no distress Abdomen: Soft, non-tender, non-distended, bowel sounds positive. Extremities: No clubbing, cyanosis, or edema. Pulses are 2+ Skin: Clean and intact without signs of breakdown Neuro: Pt is cognitively appropriate with normal insight, memory, and awareness. Cranial nerves 2-12 are intact. Sensory exam is normal. Reflexes are 2+ in all 4's. Fine motor coordination is intact. No tremors.  Motor function is grossly 5/5, except 3/5 in left shoulder abduction and 4/5 in left grip strength.   Psych: Pt's  affect is appropriate. Pt is cooperative. Decreased insight into deficits, often defers decisions to her son.   Assessment and Plan: Perline MONTINE STIGLICH is a 73 y.o. female admitted for a C1 laminectomy due to myelopathy with acute hemiparesis post-operatively, s/p return to OR for hematoma evacuation.  --Gabrielle Donovan would benefit from inpatient rehabilitation to increase her strength and balance and maximize her safety when she returns home. She also demonstrates a decreased awareness of her deficits at this time. She also had dizziness and nausea while standing with PT today. Admission pending insurance authorization.  --Consider administering Zofran prior to PT session to minimize nausea with mobility.  --She would likely be able to tolerate 3 hours of therapy per day. --Pain is well controlled, denies constipation, sleeping well, has strong support system. --Incentive spirometer 10x/hr to prevent atelectasis.  Thank you for this consult and for allowing [place] to be a part of Gabrielle Donovan' care.   Vicente Masson, MD 01/19/2019

## 2019-01-19 NOTE — Evaluation (Signed)
Physical Therapy Evaluation Patient Details Name: Gabrielle Donovan MRN: [id number] DOB: 1945/08/14 Today's Date: 01/19/2019   History of Present Illness  73 yo admitted for C1 laminectomy due to myelopathy with acute hemiparesis post op with return to OR for hematoma evacuation. PMhx: Rt THA anterior  Clinical Impression  Pt in bed after getting to [facility] with nursing staff. PT reports dizziness and limited with mobility due to dizziness and nausea with VSS and zofran given during session. Pt with left weakness, impaired balance in standing, limited transfers and mobility who will benefit from acute therapy to maximize mobility, function and safety to decrease burden of care and fall risk. Pt educated for cervical precautions and collar wear as collar backward on arrival and repositioned.   Supine BP 128/57, sitting 115/51 In chair after transfer 132/60 HR 67    Follow Up Recommendations Home health PT;Supervision/Assistance - 24 hour;CIR(pending progression)    Equipment Recommendations  3in1 (PT)    Recommendations for Other Services OT consult     Precautions / Restrictions Precautions Precautions: Fall;Cervical Precaution Booklet Issued: No Precaution Comments: pt reporting nausea and dizziness with mobility Required Braces or Orthoses: Cervical Brace Cervical Brace: Soft collar;At all times      Mobility  Bed Mobility Overal bed mobility: Needs Assistance Bed Mobility: Rolling;Sidelying to Sit Rolling: Min guard Sidelying to sit: Min assist       General bed mobility comments: assist to rise from surface with cues for sequence and precautions  Transfers Overall transfer level: Needs assistance   Transfers: Sit to/from Stand;Stand Pivot Transfers Sit to Stand: Mod assist Stand pivot transfers: Mod assist       General transfer comment: mod assist to stand x 2 trials with pt limited by dizziness and nausea without nystagmus, vitals stable and zofran given during  session. Physical assist for balance and transfer as pt with left lean and decreased use of LUE and LLE with transfer, scissoring legs with pivot with  Ambulation/Gait             General Gait Details: unable at this time  Stairs            Wheelchair Mobility    Modified Rankin (Stroke Patients Only)       Balance Overall balance assessment: Needs assistance Sitting-balance support: No upper extremity supported;Feet supported Sitting balance-Leahy Scale: Good Sitting balance - Comments: pt able to sit EOB without assist Postural control: Left lateral lean Standing balance support: Bilateral upper extremity supported Standing balance-Leahy Scale: Poor Standing balance comment: bil UE supported on RW and left lean in standing with assist to maintain balance                             Pertinent Vitals/Pain Pain Assessment: 0-10 Pain Score: 3  Pain Location: neck Pain Descriptors / Indicators: Aching Pain Intervention(s): Limited activity within patient's tolerance;Repositioned;Premedicated before session;Monitored during session    Home Living Family/patient expects to be discharged to:: Private residence Living Arrangements: Alone Available Help at Discharge: Family;Available 24 hours/day Type of Home: House Home Access: Level entry     Home Layout: One level Home Equipment: Walker - 2 wheels;Kasandra Knudsen - single point Additional Comments: pt lives alone in a mobile home with stairs to enter but will D/C to daughters house with level entry in [place]    Prior Function Level of Independence: Independent         Comments: walks with a cane  outside, cares for herself and drives     Hand Dominance        Extremity/Trunk Assessment   Upper Extremity Assessment Upper Extremity Assessment: Defer to OT evaluation    Lower Extremity Assessment Lower Extremity Assessment: Generalized weakness    Cervical / Trunk Assessment Cervical /  Trunk Assessment: Other exceptions Cervical / Trunk Exceptions: post surgical with collar and difficulty extending neck  Communication   Communication: No difficulties  Cognition Arousal/Alertness: Awake/alert Behavior During Therapy: WFL for tasks assessed/performed Overall Cognitive Status: Impaired/Different from baseline Area of Impairment: Safety/judgement                         Safety/Judgement: Decreased awareness of deficits;Decreased awareness of safety            General Comments      Exercises     Assessment/Plan    PT Assessment Patient needs continued PT services  PT Problem List Decreased strength;Decreased mobility;Decreased safety awareness;Decreased activity tolerance;Decreased cognition;Decreased balance;Decreased knowledge of use of DME       PT Treatment Interventions Gait training;Balance training;DME instruction;Therapeutic exercise;Functional mobility training;Therapeutic activities;Patient/family education    PT Goals (Current goals can be found in the Care Plan section)  Acute Rehab PT Goals Patient Stated Goal: return to taking care of myself, listening to church music PT Goal Formulation: With patient Time For Goal Achievement: 02/02/19 Potential to Achieve Goals: Fair    Frequency Min 5X/week   Barriers to discharge        Co-evaluation               AM-PAC PT "6 Clicks" Mobility  Outcome Measure Help needed turning from your back to your side while in a flat bed without using bedrails?: A Little Help needed moving from lying on your back to sitting on the side of a flat bed without using bedrails?: A Little Help needed moving to and from a bed to a chair (including a wheelchair)?: A Lot Help needed standing up from a chair using your arms (e.g., wheelchair or bedside chair)?: A Lot Help needed to walk in hospital room?: Total Help needed climbing 3-5 steps with a railing? : Total 6 Click Score: 12    End of Session  Equipment Utilized During Treatment: Cervical collar;Gait belt Activity Tolerance: Patient tolerated treatment well Patient left: in chair;with call bell/phone within reach;with chair alarm set Nurse Communication: Mobility status;Precautions PT Visit Diagnosis: Other abnormalities of gait and mobility (R26.89);Unsteadiness on feet (R26.81)    Time: [id number] PT Time Calculation (min) (ACUTE ONLY): 22 min   Charges:   PT Evaluation $PT Eval Moderate Complexity: [place], PT Acute Rehabilitation Services Pager: [phone] Office: [phone]   Sandy Salaam Laneya Gasaway 01/19/2019, 8:44 AM

## 2019-01-19 NOTE — Progress Notes (Signed)
Patient arrived to 4N22 @ 0030 11/6.  Her possessions upon arrival included; one cane, one pair of furry crocs, one purse (which had reading glasses, watch, car keys, multivitamin, thyroid meds, iron supplement, eye drops, dorzolamide, ibuprofen, drivers license, mask, and house keys), toiletry bag (which included toothbrush, toothpaste, lotions, stool softener, saline nostril spray, and soap), One bag with crocs jacket and sweatpants.

## 2019-01-19 NOTE — Progress Notes (Signed)
Inpatient Rehab Admissions:  Inpatient Rehab Consult received.  I met with patient and her son, Leroy Sea, at the bedside for rehabilitation assessment and to discuss goals and expectations of an inpatient rehab admission.  They are both hopeful for CIR admission and agreeable to starting insurance authorization process. I will need OT evaluation completed prior to sending clinicals.  Will look towards possible admission early next week pending insurance auth and bed availability.   Signed: Shann Medal, PT, DPT Admissions Coordinator [phone] 01/19/19  1:30 PM

## 2019-01-19 NOTE — Anesthesia Postprocedure Evaluation (Signed)
Anesthesia Post Note  Patient: Gabrielle Donovan  Procedure(s) Performed: POSTERIOR CERVICAL WOUND EXPLORATION (N/A Neck)     Patient location during evaluation: PACU Anesthesia Type: General Level of consciousness: awake and alert Pain management: pain level controlled Vital Signs Assessment: post-procedure vital signs reviewed and stable Respiratory status: spontaneous breathing, nonlabored ventilation, respiratory function stable and patient connected to nasal cannula oxygen Cardiovascular status: blood pressure returned to baseline and stable Postop Assessment: no apparent nausea or vomiting Anesthetic complications: no    Last Vitals:  Vitals:   01/19/19 0015 01/19/19 0030  BP: (!) 146/62 138/89  Pulse: 71 79  Resp: 14   Temp:    SpO2: 100% 99%    Last Pain:  Vitals:   01/19/19 0030  TempSrc: Oral  PainSc:                  [name]

## 2019-01-19 NOTE — Progress Notes (Signed)
Patient ID: Gabrielle Donovan, female   DOB: 22-Jan-1946, 73 y.o.   MRN: [id number] Vital signs are stable Left upper extremity is moving much better distally and left lower extremity is likewise moving better distally with wiggling of toes lifting of leg handgrips are 3 out of 5 physical therapy is noted that she has significant weakness.  She may be a good candidate for comprehensive inpatient rehabilitation.  I will ask for consult today.  Drain should remain in place today.  If drainage is minimal tomorrow may DC drain.  We will likely need some form of intensive rehab prior to discharge home.

## 2019-01-19 NOTE — Evaluation (Signed)
Occupational Therapy Evaluation Patient Details Name: Gabrielle Donovan MRN: [id number] DOB: 1946/02/21 Today's Date: 01/19/2019    History of Present Illness 73 yo admitted for C1 laminectomy due to myelopathy with acute hemiparesis post op with return to OR for hematoma evacuation. PMhx: Rt THA anterior   Clinical Impression   Pt PTA: Pt performing ADL functional mobility and transfers with independence and ADL tasks with independence until recent L sided weakness. Pt currenly with weakness 3-/5 MM grade in shoulder and grip strength 3+/5 MM grade continues to have decreased sensation but feels the same on both sides. Pt very limited by poor strength, decreased coordination and decreased ability to care for self. Pt unable to safely ambulate without modA to maxA with RW and only able to take a few steps. Pt would benefit from continued OT skilled services for ADL, mobility and safety in CIR setting. Pt appears to have stamina and motivation to return to PLOF for CIR setting. OT following acutely.    Follow Up Recommendations  CIR    Equipment Recommendations  Other (comment)(to be determined)    Recommendations for Other Services Rehab consult     Precautions / Restrictions Precautions Precautions: Fall;Cervical Precaution Booklet Issued: No Precaution Comments: pt reporting nausea and dizziness with mobility Required Braces or Orthoses: Cervical Brace Cervical Brace: Soft collar;At all times Restrictions Weight Bearing Restrictions: No      Mobility Bed Mobility Overal bed mobility: Needs Assistance             General bed mobility comments: pt in recliner upon arrival  Transfers Overall transfer level: Needs assistance   Transfers: Sit to/from Stand;Stand Pivot Transfers Sit to Stand: Mod assist;Max assist Stand pivot transfers: Mod assist;Max assist       General transfer comment: ModA to maxA to rise from chair and to stand pivot to [facility]. Pt requiring cues to  proper technique    Balance Overall balance assessment: Needs assistance Sitting-balance support: No upper extremity supported;Feet supported Sitting balance-Leahy Scale: Good Sitting balance - Comments: pt able to sit EOB without assist   Standing balance support: Bilateral upper extremity supported Standing balance-Leahy Scale: Poor                             ADL either performed or assessed with clinical judgement   ADL Overall ADL's : Needs assistance/impaired Eating/Feeding: Modified independent;Sitting   Grooming: Supervision/safety;Sitting   Upper Body Bathing: Supervision/ safety;Sitting   Lower Body Bathing: Moderate assistance;Cueing for safety;Cueing for sequencing;Sitting/lateral leans;Sit to/from stand   Upper Body Dressing : Supervision/safety;Sitting   Lower Body Dressing: Moderate assistance;+2 for physical assistance;Cueing for safety;Cueing for sequencing;Sitting/lateral leans;Sit to/from stand   Toilet Transfer: Moderate assistance;Maximal assistance;RW;Regular Toilet;Stand-pivot   Toileting- Clothing Manipulation and Hygiene: Moderate assistance;Maximal assistance;Cueing for safety;Sitting/lateral lean;Sit to/from stand       Functional mobility during ADLs: Moderate assistance;Maximal assistance;Rolling walker;Cueing for safety;Cueing for sequencing General ADL Comments: Pt very limited by poor strength, decreased coordination and decreased ability to care for self. Pt unable to safely ambulate without modA to maxA with RW and only able to take a few steps.     Vision Baseline Vision/History: Wears glasses Wears Glasses: Reading only Patient Visual Report: No change from baseline Vision Assessment?: No apparent visual deficits     Perception     Praxis      Pertinent Vitals/Pain Pain Assessment: 0-10 Pain Score: 3  Pain Location: neck Pain Descriptors /  Indicators: Aching Pain Intervention(s): Limited activity within patient's  tolerance     Hand Dominance     Extremity/Trunk Assessment Upper Extremity Assessment Upper Extremity Assessment: Generalized weakness;RUE deficits/detail;LUE deficits/detail RUE Deficits / Details: weakness 3-/5 MM grade in shoulder and grip strength 3+/5 MM grade continues to have decreased sensation but feels the same on both sides. RUE Sensation: decreased light touch LUE Deficits / Details: weakness 3-/5 MM grade in shoulder and grip strength 3+/5 MM grade continues to have decreased sensation but feels the same on both sides. LUE Sensation: decreased light touch   Lower Extremity Assessment Lower Extremity Assessment: Generalized weakness   Cervical / Trunk Assessment Cervical / Trunk Assessment: Other exceptions Cervical / Trunk Exceptions: post surgical with collar and difficulty extending neck   Communication Communication Communication: No difficulties   Cognition Arousal/Alertness: Awake/alert Behavior During Therapy: WFL for tasks assessed/performed Overall Cognitive Status: Impaired/Different from baseline Area of Impairment: Safety/judgement;Awareness                         Safety/Judgement: Decreased awareness of deficits;Decreased awareness of safety     General Comments: pt stating "I can go home after a few days." Although, pt unable to take more than a few steps with RW and modA overall.    General Comments       Exercises     Shoulder Instructions      Home Living Family/patient expects to be discharged to:: Private residence Living Arrangements: Alone Available Help at Discharge: Family;Available 24 hours/day Type of Home: House Home Access: Level entry     Home Layout: One level     Bathroom Shower/Tub: Occupational psychologist: Standard     Home Equipment: Environmental consultant - 2 wheels;Cane - single point   Additional Comments: pt lives alone in a mobile home with stairs to enter but will D/C to daughters house with level entry  in [place]      Prior Functioning/Environment Level of Independence: Independent        Comments: walks with a cane outside, cares for herself and drives        OT Problem List: Decreased strength;Decreased activity tolerance;Impaired balance (sitting and/or standing);Decreased safety awareness      OT Treatment/Interventions: Self-care/ADL training;Therapeutic exercise;Neuromuscular education;Energy conservation;Therapeutic activities;Patient/family education;Balance training    OT Goals(Current goals can be found in the care plan section) Acute Rehab OT Goals Patient Stated Goal: return to taking care of myself, listening to church music OT Goal Formulation: With patient Time For Goal Achievement: 02/02/19 Potential to Achieve Goals: Good ADL Goals Pt Will Perform Grooming: standing;with supervision Pt Will Perform Lower Body Dressing: with supervision;sit to/from stand Pt/caregiver will Perform Home Exercise Program: Both right and left upper extremity;With Supervision;Increased strength  OT Frequency: Min 2X/week   Barriers to D/C:            Co-evaluation              AM-PAC OT "6 Clicks" Daily Activity     Outcome Measure Help from another person eating meals?: None Help from another person taking care of personal grooming?: A Little Help from another person toileting, which includes using toliet, bedpan, or urinal?: A Lot Help from another person bathing (including washing, rinsing, drying)?: A Lot Help from another person to put on and taking off regular upper body clothing?: A Little Help from another person to put on and taking off regular lower body clothing?: A Lot  6 Click Score: 16   End of Session Equipment Utilized During Treatment: Gait belt;Rolling walker Nurse Communication: Mobility status  Activity Tolerance: Patient tolerated treatment well Patient left: in chair;with call bell/phone within reach;with chair alarm set  OT Visit  Diagnosis: Unsteadiness on feet (R26.81);Muscle weakness (generalized) (M62.81)                Time: [id number] OT Time Calculation (min): 26 min Charges:  OT General Charges $OT Visit: 1 Visit OT Evaluation $OT Eval Moderate Complexity: 1 Mod OT Treatments $Self Care/Home Management : 8-22 mins  Ebony Hail Harold Hedge) Marsa Aris OTR/L Acute Rehabilitation Services Pager: [phone] Office: [place] 01/19/2019, 2:28 PM

## 2019-01-20 NOTE — Progress Notes (Signed)
Physical Therapy Treatment Patient Details Name: Gabrielle Donovan MRN: [id number] DOB: [birth year] Today's Date: 01/20/2019    History of Present Illness 73 yo admitted for C1 laminectomy due to myelopathy with acute hemiparesis post op with return to OR for hematoma evacuation. PMhx: Rt THA anterior    PT Comments    Pt with improved ambulation ability today. Pt presents with narrowed, scissored gait pattern initially however with verbal cues and minA pt able to ambulate maintaining shoulder width apart. Pt with noted bilat decreased dorsi flexion and increased bilat UE support on RW. Pt able to state precautions however requires verbal cues to adhere to functionally. Cont to recommend CIR upon d/c to achieve safe mod I level of function.   Follow Up Recommendations  CIR     Equipment Recommendations  3in1 (PT)    Recommendations for Other Services       Precautions / Restrictions Precautions Precautions: Fall;Cervical Precaution Booklet Issued: Yes (comment) Precaution Comments: pt able to recall Required Braces or Orthoses: Cervical Brace Cervical Brace: Soft collar;At all times Restrictions Weight Bearing Restrictions: No    Mobility  Bed Mobility Overal bed mobility: Needs Assistance Bed Mobility: Supine to Sit     Supine to sit: Supervision     General bed mobility comments: HOB elevated, pt pulled self up to EOB, re-educated on rolling to sidelying and then pushing self up  Transfers Overall transfer level: Needs assistance Equipment used: Rolling walker (2 wheeled) Transfers: Sit to/from Stand Sit to Stand: Min assist         General transfer comment: verbal cues to push up from bed not pull up on walker  Ambulation/Gait Ambulation/Gait assistance: Min assist Gait Distance (Feet): 120 Feet Assistive device: Rolling walker (2 wheeled) Gait Pattern/deviations: Narrow base of support;Staggering left;Decreased stride length;Step-through  pattern;Scissoring Gait velocity: dec Gait velocity interpretation: <1.8 ft/sec, indicate of risk for recurrent falls General Gait Details: pt initially with scissor gait pattern with R LE crossing over the L, with max directional verbal cues pt able to maintain LEs apart from each other, noted bilat decresaed foot dorsiflexion through swing phase   Stairs             Wheelchair Mobility    Modified Rankin (Stroke Patients Only)       Balance Overall balance assessment: Needs assistance Sitting-balance support: No upper extremity supported;Feet supported Sitting balance-Leahy Scale: Good     Standing balance support: Bilateral upper extremity supported Standing balance-Leahy Scale: Poor Standing balance comment: bil UE supported on RW and left lean in standing with assist to maintain balance                            Cognition Arousal/Alertness: Awake/alert Behavior During Therapy: WFL for tasks assessed/performed Overall Cognitive Status: Impaired/Different from baseline Area of Impairment: Safety/judgement                         Safety/Judgement: Decreased awareness of safety(in regard to precautions)     General Comments: pt did require verbal cues to adhere to precautions functionally      Exercises      General Comments General comments (skin integrity, edema, etc.): VSS      Pertinent Vitals/Pain Pain Assessment: 0-10 Pain Score: 3  Pain Location: neck Pain Descriptors / Indicators: Aching Pain Intervention(s): Limited activity within patient's tolerance    Home Living  Prior Function            PT Goals (current goals can now be found in the care plan section) Progress towards PT goals: Progressing toward goals    Frequency    Min 5X/week      PT Plan Current plan remains appropriate    Co-evaluation              AM-PAC PT "6 Clicks" Mobility   Outcome Measure  Help  needed turning from your back to your side while in a flat bed without using bedrails?: A Little Help needed moving from lying on your back to sitting on the side of a flat bed without using bedrails?: A Little Help needed moving to and from a bed to a chair (including a wheelchair)?: A Little Help needed standing up from a chair using your arms (e.g., wheelchair or bedside chair)?: A Little Help needed to walk in hospital room?: A Lot Help needed climbing 3-5 steps with a railing? : A Lot 6 Click Score: 16    End of Session Equipment Utilized During Treatment: Gait belt Activity Tolerance: Patient tolerated treatment well Patient left: in chair;with call bell/phone within reach;with chair alarm set Nurse Communication: Mobility status;Precautions PT Visit Diagnosis: Other abnormalities of gait and mobility (R26.89);Unsteadiness on feet (R26.81)     Time: [id number] PT Time Calculation (min) (ACUTE ONLY): 13 min  Charges:  $Gait Training: 8-22 mins                     Kittie Plater, PT, DPT Acute Rehabilitation Services Pager #: [phone] Office #: [phone]    Berline Lopes 01/20/2019, 1:44 PM

## 2019-01-20 NOTE — Progress Notes (Signed)
Patient ID: Gabrielle Donovan, female   DOB: 09/07/1945, 73 y.o.   MRN: [id number] BP (!) 130/42   Pulse 85   Temp 98.9 F (37.2 C) (Oral)   Resp 18   Ht 5' 6.5" (1.689 m)   Wt 57.6 kg   SpO2 98%   BMI 20.19 kg/m  Both Gabrielle Donovan and her daughter believe she is ok for discharge home. She has been walking and clearly does not want inpatient rehab. Will plan on discharge tomorrow Moving all extremities well

## 2019-01-21 MED ORDER — METHOCARBAMOL 500 MG PO TABS: 500.0000 mg | ORAL_TABLET | Freq: Four times a day (QID) | ORAL | 0 refills | Status: AC | PRN

## 2019-01-21 MED ORDER — HYDROCODONE-ACETAMINOPHEN 5-325 MG PO TABS: 1.0000 | ORAL_TABLET | ORAL | 0 refills | Status: DC | PRN

## 2019-01-21 NOTE — Discharge Instructions (Signed)
May remove when in bed: Yes   May ambulate to bathroom without brace: Yes   Apply/Remove Brace While sitting   may remove brace to shower Yes    Use incentive spirometer every 2 hours while awake for 48 hours Then every 4 hours while awake

## 2019-01-21 NOTE — TOC Transition Note (Addendum)
Transition of Care [facility] - CM/SW Discharge Note   Patient Details  Name: Gabrielle Donovan MRN: [id number] Date of Birth: 1945-08-11  Transition of Care [facility] CM/SW Contact:  Claudie Leach, RN Phone Number: [phone] 01/21/2019, 12:54 PM   Clinical Narrative:    Pt to go home with family assist.  Patient presented with Medicare [facility] list. Patient chooses [facility].  Information faxed to [place] [phone]).  Pt plans to stay with daughter in [place] for 1 week and would like to start [facility] therapy after that time.  Pt has 3n1 at home but daughter does not have one.  Patient would like another delivered that she can take to daughter's. Patient understands that she may incur the bill for this since insurance paid for her 1st 3n1.   DME 3n1 to be delivered to room prior to d/c.     Final next level of care: [place] Barriers to Discharge: No Barriers Identified   Patient Goals and CMS Choice Patient states their goals for this hospitalization and ongoing recovery are:: to go home CMS Medicare.gov Compare Post Acute Care list provided to:: Patient Choice offered to / list presented to : Patient   Discharge Plan and Services                DME Arranged: 3-N-1 DME Agency: [employer] Date DME Agency Contacted: 01/21/19 Time DME Agency Contacted: [id number] Representative spoke with at DME Agency: [place]: PT, OT [place] Agency: [place], [place]) Date [place]: 01/21/19 Time [place]: [place] Representative spoke with at [place]: Bonnita Nasuti

## 2019-01-21 NOTE — Progress Notes (Addendum)
Discharge instructions including but not limited to f/u appointments, meds, site care, activity, incentive spirometry, when to call MD & etc given to pt & family at bedside. All questions answered. prescriptions updated to be sent to pharmacy in [place] where pt will be staying as well as Home Health. Pt's 3 in 1 was delivered to room & pt is free to go. IV & telemetry removed. Hoover Brunette, RN   Pt's hemovac was removed by MD Cabb[place] prior to leaving. Dressing applied to site. Hoover Brunette, RN

## 2019-01-21 NOTE — Plan of Care (Signed)
  Problem: Safety: Goal: Ability to remain free from injury will improve Outcome: Completed/Met   Problem: Education: Goal: Ability to verbalize activity precautions or restrictions will improve Outcome: Completed/Met Goal: Knowledge of the prescribed therapeutic regimen will improve Outcome: Completed/Met Goal: Understanding of discharge needs will improve Outcome: Adequate for Discharge   Problem: Activity: Goal: Ability to avoid complications of mobility impairment will improve Outcome: Adequate for Discharge Goal: Ability to tolerate increased activity will improve Outcome: Adequate for Discharge Goal: Will remain free from falls Outcome: Completed/Met   Problem: Bowel/Gastric: Goal: Gastrointestinal status for postoperative course will improve Outcome: Completed/Met   Problem: Clinical Measurements: Goal: Ability to maintain clinical measurements within normal limits will improve Outcome: Adequate for Discharge Goal: Postoperative complications will be avoided or minimized Outcome: Completed/Met Goal: Diagnostic test results will improve Outcome: Completed/Met   Problem: Pain Management: Goal: Pain level will decrease Outcome: Adequate for Discharge   Problem: Skin Integrity: Goal: Will show signs of wound healing Outcome: Completed/Met   Problem: Health Behavior/Discharge Planning: Goal: Identification of resources available to assist in meeting health care needs will improve Outcome: Adequate for Discharge   Problem: Bladder/Genitourinary: Goal: Urinary functional status for postoperative course will improve Outcome: Completed/Met

## 2019-01-21 NOTE — Discharge Summary (Signed)
Physician Discharge Summary  Patient ID: Gabrielle Donovan MRN: [id number] DOB/AGE: 1946-02-10 73 y.o.  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 Post op she had a neurological decline and became plegic on her left side. She was taken  Back to the operating room and a hematoma was evacuated. Post op she is doing much better. She and her family do not wish to participate with inpatient rehabilitation. They and she have requested that she go home.   Treatments: surgery: Cervical laminectomy of C1 with evacuation of synovial cyst C1-C2  Surgical exploration of cervical wound with evacuation of hematoma    Discharge Exam: Blood pressure (!) 146/53, pulse 71, temperature 98.6 F (37 C), temperature source Oral, resp. rate 20, height 5' 6.5" (1.689 m), weight 57.6 kg, SpO2 100 %. General appearance: alert, cooperative and appears stated age Neurologic: Motor: moving all extremities, has been walking with assistance.  Disposition: Discharge disposition: 01-Home or Self Care      left sided plegia Discharge Instructions    Incentive spirometry RT   Complete by: As directed    Incentive spirometry RT   Complete by: As directed      Allergies as of 01/21/2019      Reactions   Codeine Nausea And Vomiting   Hydrocodone Nausea And Vomiting      Medication List    TAKE these medications   aspirin EC 81 MG tablet Take 81 mg by mouth daily.   dorzolamide-timolol 22.3-6.8 MG/ML ophthalmic solution Commonly known as: COSOPT Place 1 drop  into the right eye 2 (two) times daily.   folic acid 1 MG tablet Commonly known as: FOLVITE Take 1 mg by mouth daily.   HYDROcodone-acetaminophen 5-325 MG tablet Commonly known as: NORCO/VICODIN Take 1-2 tablets by mouth every 4 (four) hours as needed for moderate pain or severe pain.   hydroxyurea 500 MG capsule Commonly known as: HYDREA Take 500-1,000 mg by mouth See admin instructions. Takes 2 in the morning and 1 in the evening   latanoprost 0.005 % ophthalmic solution Commonly known as: XALATAN Place 1 drop into the right eye at bedtime.   lisinopril 10 MG tablet Commonly known as: ZESTRIL Take 10 mg by mouth every morning.   methocarbamol 500 MG tablet Commonly known as: ROBAXIN Take 1 tablet (500 mg total) by mouth every 6 (six) hours as needed for up to 15 days for muscle spasms.   multivitamin with minerals Tabs tablet Take 1 tablet by mouth daily.   NP Thyroid 30 MG tablet Generic drug: thyroid Take 30 mg by mouth daily before breakfast.      Follow-up Information    Kristeen Miss, MD Follow up in 2 week(s).   Specialty: Neurosurgery Why: please call to make an appointment Contact information: [address] [place] [address] [phone]           Signed: Ashok Pall 01/21/2019, 11:31 AM

## 2019-04-19 ENCOUNTER — Ambulatory Visit: Payer: Medicare HMO | Attending: Internal Medicine

## 2019-04-19 ENCOUNTER — Other Ambulatory Visit: Payer: Self-pay

## 2019-04-19 DIAGNOSIS — Z23 Encounter for immunization: Secondary | ICD-10-CM | POA: Insufficient documentation

## 2019-04-19 NOTE — Progress Notes (Signed)
   Covid-19 Vaccination Clinic  Name:  Gabrielle Donovan    MRN: [id number] DOB: 22-Sep-1945  04/19/2019  Gabrielle Donovan was observed post Covid-19 immunization for 15 minutes without incidence. She was provided with Vaccine Information Sheet and instruction to access the V-Safe system.   Gabrielle Donovan was instructed to call 911 with any severe reactions post vaccine: Marland Kitchen Difficulty breathing  . Swelling of your face and throat  . A fast heartbeat  . A bad rash all over your body  . Dizziness and weakness    Immunizations Administered    Name Date Dose VIS Date Route   Moderna COVID-19 Vaccine 04/19/2019 12:19 PM 0.5 mL 02/13/2019 Intramuscular   Manufacturer: Moderna   Lot: [id number]   [id number]

## 2019-05-21 ENCOUNTER — Ambulatory Visit: Payer: Medicare HMO | Attending: Internal Medicine

## 2019-05-21 DIAGNOSIS — Z23 Encounter for immunization: Secondary | ICD-10-CM | POA: Insufficient documentation

## 2019-05-21 NOTE — Progress Notes (Signed)
   Covid-19 Vaccination Clinic  Name:  Gabrielle Donovan    MRN: [id number] DOB: Nov 16, 1945  05/21/2019  Ms. Gabrielle Donovan was observed post Covid-19 immunization for 15 minutes without incident. She was provided with Vaccine Information Sheet and instruction to access the V-Safe system.   Ms. Gabrielle Donovan was instructed to call 911 with any severe reactions post vaccine: Marland Kitchen Difficulty breathing  . Swelling of face and throat  . A fast heartbeat  . A bad rash all over body  . Dizziness and weakness   Immunizations Administered    Name Date Dose VIS Date Route   Moderna COVID-19 Vaccine 05/21/2019 10:56 AM 0.5 mL 02/13/2019 Intramuscular   Manufacturer: Moderna   Lot: [id number]   [id number]

## 2020-01-25 ENCOUNTER — Other Ambulatory Visit: Payer: Self-pay

## 2020-01-25 ENCOUNTER — Encounter (HOSPITAL_COMMUNITY): Payer: Self-pay | Admitting: Emergency Medicine

## 2020-01-25 DIAGNOSIS — Z7982 Long term (current) use of aspirin: Secondary | ICD-10-CM | POA: Insufficient documentation

## 2020-01-25 DIAGNOSIS — I10 Essential (primary) hypertension: Secondary | ICD-10-CM | POA: Diagnosis not present

## 2020-01-25 DIAGNOSIS — D539 Nutritional anemia, unspecified: Secondary | ICD-10-CM | POA: Diagnosis not present

## 2020-01-25 DIAGNOSIS — R040 Epistaxis: Secondary | ICD-10-CM | POA: Insufficient documentation

## 2020-01-25 DIAGNOSIS — Z79899 Other long term (current) drug therapy: Secondary | ICD-10-CM | POA: Diagnosis not present

## 2020-01-25 DIAGNOSIS — D75839 Thrombocytosis, unspecified: Secondary | ICD-10-CM | POA: Insufficient documentation

## 2020-01-25 NOTE — ED Triage Notes (Signed)
Pt c/o nose bleed x 2 hours; bleeding controlled during triage

## 2020-01-26 ENCOUNTER — Emergency Department (HOSPITAL_COMMUNITY)
Admission: EM | Admit: 2020-01-26 | Discharge: 2020-01-26 | Disposition: A | Discharge status: Home / Self Care | Payer: Medicare HMO | Attending: Emergency Medicine | Admitting: Emergency Medicine

## 2020-01-26 DIAGNOSIS — R040 Epistaxis: Secondary | ICD-10-CM

## 2020-01-26 DIAGNOSIS — D539 Nutritional anemia, unspecified: Secondary | ICD-10-CM

## 2020-01-26 DIAGNOSIS — D75839 Thrombocytosis, unspecified: Secondary | ICD-10-CM

## 2020-01-26 LAB — BASIC METABOLIC PANEL
Anion gap: 11 (ref 5–15)
BUN: 15 mg/dL (ref 8–23)
CO2: 24 mmol/L (ref 22–32)
Calcium: 9.7 mg/dL (ref 8.9–10.3)
Chloride: 103 mmol/L (ref 98–111)
Creatinine, Ser: 0.66 mg/dL (ref 0.44–1.00)
GFR, Estimated: >60 mL/min (ref >60)
Glucose, Bld: 100 mg/dL — ABNORMAL HIGH (ref 70–99)
Potassium: 3.7 mmol/L (ref 3.5–5.1)
Sodium: 138 mmol/L (ref 135–145)

## 2020-01-26 LAB — CBC WITH DIFFERENTIAL/PLATELET
Abs Immature Granulocytes: 0.04 10*3/uL (ref 0.00–0.07)
Basophils Absolute: 0.1 10*3/uL (ref 0.0–0.1)
Basophils Relative: 1 %
Eosinophils Absolute: 0.2 10*3/uL (ref 0.0–0.5)
Eosinophils Relative: 2 %
HCT: 35.2 % — ABNORMAL LOW (ref 36.0–46.0)
Hemoglobin: 11.8 g/dL — ABNORMAL LOW (ref 12.0–15.0)
Immature Granulocytes: 0 %
Lymphocytes Relative: 15 %
Lymphs Abs: 1.8 10*3/uL (ref 0.7–4.0)
MCH: 42.9 pg — ABNORMAL HIGH (ref 26.0–34.0)
MCHC: 33.5 g/dL (ref 30.0–36.0)
MCV: 128 fL — ABNORMAL HIGH (ref 80.0–100.0)
Monocytes Absolute: 1.3 10*3/uL — ABNORMAL HIGH (ref 0.1–1.0)
Monocytes Relative: 10 %
Neutro Abs: 9.1 10*3/uL — ABNORMAL HIGH (ref 1.7–7.7)
Neutrophils Relative %: 72 %
Platelets: 860 10*3/uL — ABNORMAL HIGH (ref 150–400)
RBC: 2.75 MIL/uL — ABNORMAL LOW (ref 3.87–5.11)
RDW: 18 % — ABNORMAL HIGH (ref 11.5–15.5)
WBC: 12.6 10*3/uL — ABNORMAL HIGH (ref 4.0–10.5)
nRBC: 0.7 % — ABNORMAL HIGH (ref 0.0–0.2)

## 2020-01-26 MED ORDER — TRANEXAMIC ACID FOR EPISTAXIS
500.0000 mg | Freq: Once | TOPICAL | Status: AC
  Administered 2020-01-26: 500 mg via TOPICAL
  Filled 2020-01-26: qty 10

## 2020-01-26 NOTE — Discharge Instructions (Signed)
Return if you are having any problems. 

## 2020-01-26 NOTE — ED Provider Notes (Signed)
[facility] EMERGENCY DEPARTMENT Provider Note   CSN: [id number] Arrival date & time: 01/25/20  2318   History Chief Complaint  Patient presents with  . Epistaxis    Gabrielle Donovan is a 74 y.o. female.  The history is provided by the patient.  Epistaxis She has history of hypertension, polycythemia and comes in because of right-sided nosebleed which started tonight.  She denies any trauma.  She has had problems with nosebleeds before and it has been related to sensitivity to dry air.  Past Medical History:  Diagnosis Date  . Anemia   . Arthritis   . Cervical myelopathy ([place])   . Diverticulitis   . Full dentures   . GI bleed   . Glaucoma    right eye  . Headache   . History of blood transfusion   . Hypertension   . Lumbago with sciatica, right side    treated with ESI  . Polycythemia   . Thrombocytosis   . Wears glasses     Patient Active Problem List   Diagnosis Date Noted  . 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 Expected blood loss anemia 08/01/2013  . Overweight (BMI 25.0-29.9) 08/01/2013  . S/P right THA, 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    Past Surgical History:  Procedure Laterality Date  . ABDOMINAL HYSTERECTOMY    . CATARACT EXTRACTION, BILATERAL    . FRACTURE SURGERY     right wrist  . POSTERIOR CERVICAL FUSION/FORAMINOTOMY N/A 01/18/2019   Procedure: Posterior decompression ring of Cervical one;  Surgeon: Kristeen Miss, MD;  Location: [place];  Service: Neurosurgery;  Laterality: N/A;  . POSTERIOR CERVICAL LAMINECTOMY N/A 01/18/2019   Procedure: POSTERIOR CERVICAL WOUND EXPLORATION;  Surgeon: Kristeen Miss, MD;  Location: [place];  Service: Neurosurgery;  Laterality: N/A;  . TOTAL HIP ARTHROPLASTY Right 07/31/2013   Procedure: RIGHT TOTAL HIP ARTHROPLASTY ANTERIOR APPROACH;  Surgeon: Mauri Pole, MD;  Location[facility];  Service: Orthopedics;  Laterality: Right;  . TUBAL LIGATION       OB History   No obstetric history on file.     Family History    Problem Relation Age of Onset  . Hypertension Mother     Social History   Tobacco Use  . Smoking status: Never Smoker  . Smokeless tobacco: Never Used  Vaping Use  . Vaping Use: Never used  Substance Use Topics  . Alcohol use: No  . Drug use: No    Home Medications Prior to Admission medications   Medication Sig Start Date End Date Taking? Authorizing Provider  aspirin EC 81 MG tablet Take 81 mg by mouth daily.    [provider]  dorzolamide-timolol (COSOPT) 22.3-6.8 MG/ML ophthalmic solution Place 1 drop into the right eye 2 (two) times daily. 12/16/18   [provider]  folic acid (FOLVITE) 1 MG tablet Take 1 mg by mouth daily.    [provider]  HYDROcodone-acetaminophen (NORCO/VICODIN) 5-325 MG tablet Take 1-2 tablets by mouth every 4 (four) hours as needed for moderate pain or severe pain. 01/21/19   Ashok Pall, MD  hydroxyurea (HYDREA) 500 MG capsule Take 500-1,000 mg by mouth See admin instructions. Takes 2 in the morning and 1 in the evening     [provider]  latanoprost (XALATAN) 0.005 % ophthalmic solution Place 1 drop into the right eye at bedtime. 10/25/18   [provider]  lisinopril (PRINIVIL,ZESTRIL) 10 MG tablet Take 10 mg by mouth every morning.    [provider]  Multiple Vitamin (MULTIVITAMIN WITH MINERALS) TABS tablet Take 1 tablet by mouth daily.    [provider]  NP THYROID 30 MG tablet Take 30 mg by mouth daily before breakfast. 12/22/18   [provider]    Allergies    Codeine and Hydrocodone  Review of Systems   Review of Systems  HENT: Positive for nosebleeds.   All other systems reviewed and are negative.   Physical Exam Updated Vital Signs BP (!) 154/86 (BP Location: Right Arm)   Pulse 70   Temp 98.1 F (36.7 C) (Temporal)   Resp 17   Ht 5\' 6"  (1.676 m)   Wt 57.6 kg   SpO2 99%   BMI 20.50 kg/m   Physical Exam Vitals and nursing note reviewed.   74 year  old female, resting comfortably and in no acute distress. Vital signs are significant for elevated blood pressure. Oxygen saturation is 99%, which is normal. Head is normocephalic and atraumatic. PERRLA, EOMI. Oropharynx is clear.  Bleeding site is identified on the right side of the nasal septum with slow oozing of blood. Neck is nontender and supple without adenopathy or JVD. Back is nontender and there is no CVA tenderness. Lungs are clear without rales, wheezes, or rhonchi. Chest is nontender. Heart has regular rate and rhythm without murmur. Abdomen is soft, flat, nontender without masses or hepatosplenomegaly and peristalsis is normoactive. Extremities have no cyanosis or edema, full range of motion is present. Skin is warm and dry without rash. Neurologic: Mental status is normal, cranial nerves are intact, there are no motor or sensory deficits.  ED Results / Procedures / Treatments   Labs (all labs ordered are listed, but only abnormal results are displayed) Labs Reviewed - No data to display  Procedures .Epistaxis Management  Date/Time: 01/26/2020 4:57 AM Performed by: Delora Fuel, MD Authorized by: Delora Fuel, MD   Consent:    Consent obtained:  Verbal   Consent given by:  Patient   Risks discussed:  Pain   Alternatives discussed:  No treatment and alternative treatment Anesthesia (see MAR for exact dosages):    Anesthesia method:  None Procedure details:    Treatment site:  R anterior   Treatment method:  Anterior pack (Cotton soaked with tranexamic acid)   Treatment complexity:  Limited   Treatment episode: initial   Post-procedure details:    Assessment:  Bleeding stopped   Patient tolerance of procedure:  Tolerated well, no immediate complications     Medications Ordered in ED Medications  tranexamic acid (CYKLOKAPRON) 1000 MG/10ML topical solution 500 mg (has no administration in time range)    ED Course  I have reviewed the triage vital signs and  the nursing notes.  Pertinent lab results that were available during my care of the patient were reviewed by me and considered in my medical decision making (see chart for details).  MDM Rules/Calculators/A&P Epistaxis.  Will treat with topical tranexamic acid.  Old records are reviewed and it is noted that in the past patient has had significant thrombocytosis.  Will check CBC to make sure that platelet count is in an appropriate range.  Platelet count is 860,000 which is indeterminate as most significant bleeding occurs with platelet counts over 1 million but some reports of increased bleeding are associated with platelet counts as low as 750,000.  Bleeding stopped following tranexamic acid administration and she had no rebleeding after 1 hour of observation and is discharged with instructions to follow-up with her  hematologist/oncologist to decide whether she needs more aggressive treatment of her thrombocytosis.  Final Clinical Impression(s) / ED Diagnoses Final diagnoses:  Anterior epistaxis  Thrombocytosis  Macrocytic anemia    Rx / DC Orders ED Discharge Orders    None       Delora Fuel, MD 86/76/72 [phone]

## 2020-09-16 IMAGING — CT CT HEAD W/O CM
4 series · 16 of 47 positions shown, 18 images · non-contrast
Comparison: Previous MRI from 12/13/2018.

CLINICAL DATA: Initial evaluation for increasing left upper
extremity weakness, recent C1 laminectomy with synovial cyst
resection.

EXAM:
CT HEAD WITHOUT CONTRAST
TECHNIQUE: Contiguous axial images were obtained from the base of the skull
through the vertex without intravenous contrast.

[Series 3: head wo · axial · 0.42mm/px · z∈[-138,-18]mm · 7 of 34 slices shown, 9 images]
[im 5/34  brain]
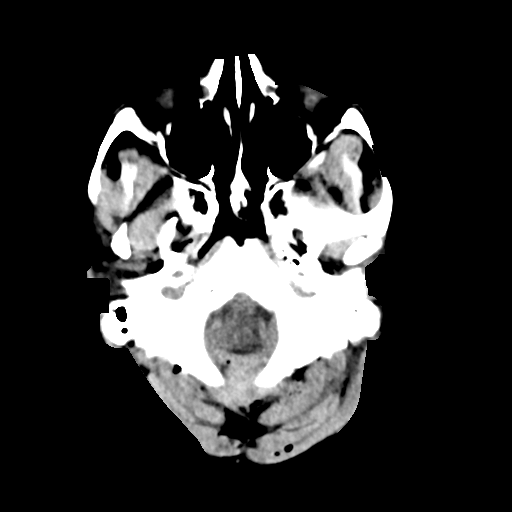
[im 5/34  bone]
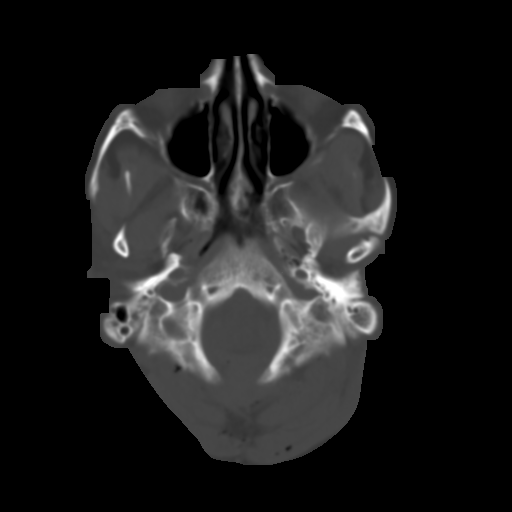
[im 9/34  brain]
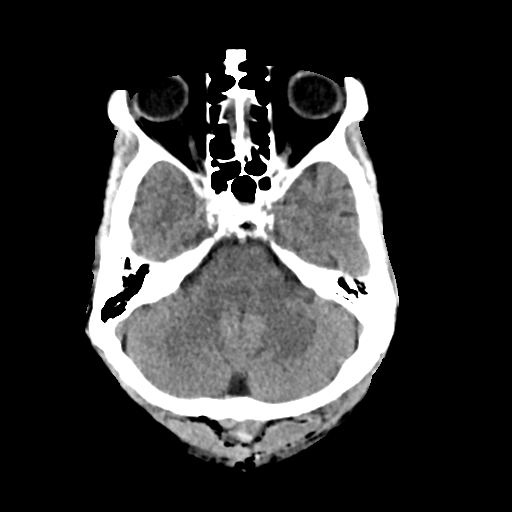
[im 13/34  brain]
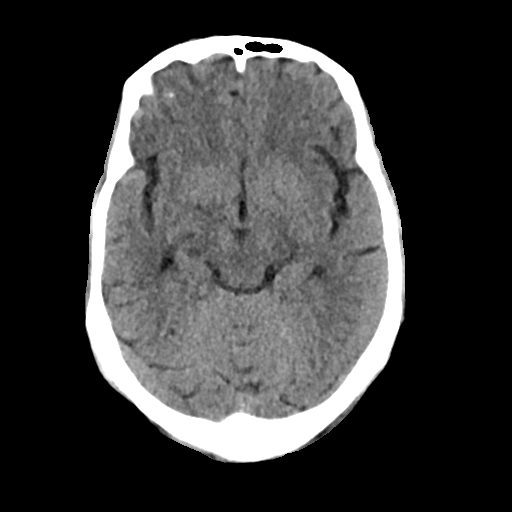
[im 17/34  brain]
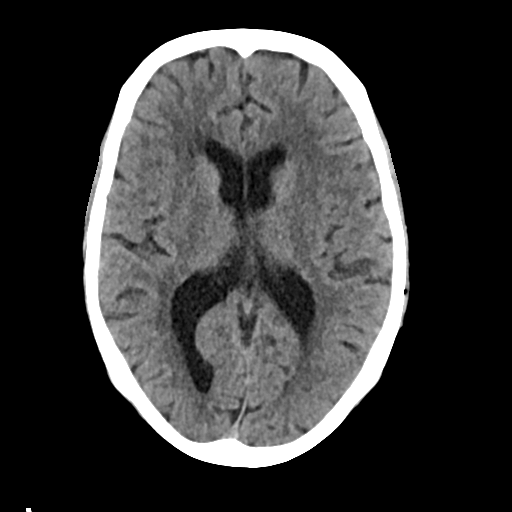
[im 21/34  brain]
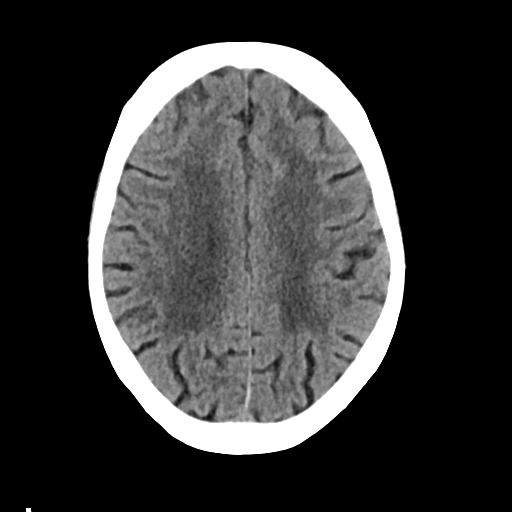
[im 21/34  bone]
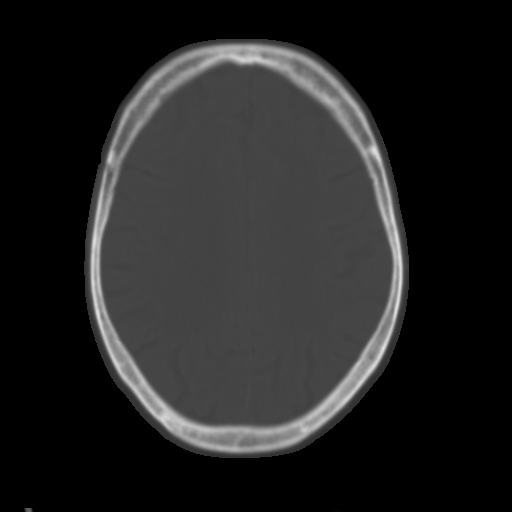
[im 25/34  brain]
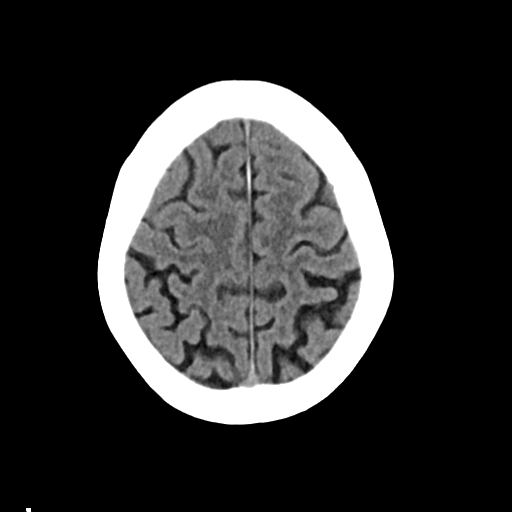
[im 29/34  brain]
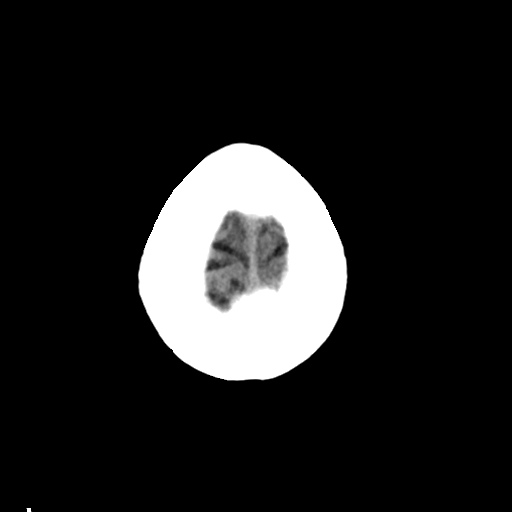

[Series 4: head bone · axial · 0.42mm/px · z∈[-142,-110]mm · 3 of 81 slices shown]
[im 9/81  bone]
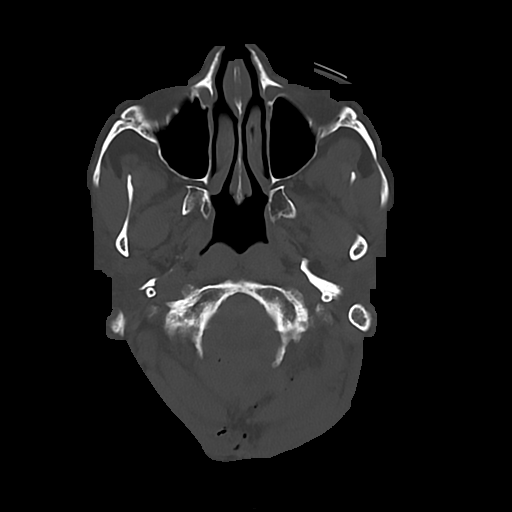
[im 17/81  bone]
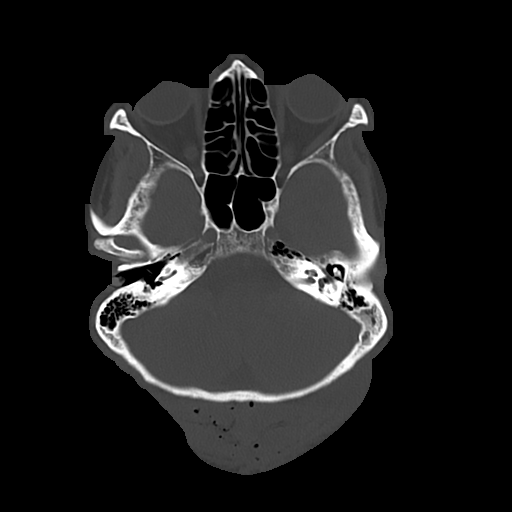
[im 25/81  bone]
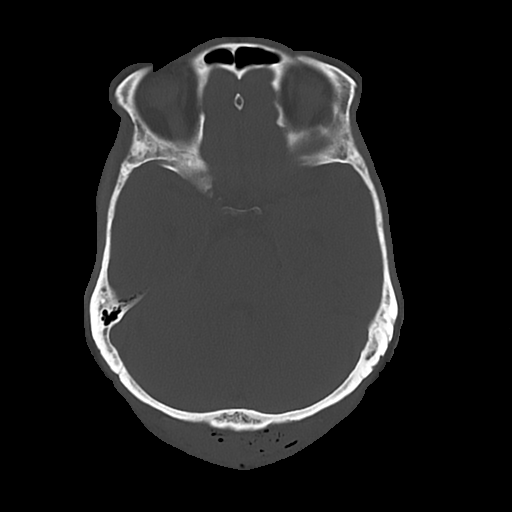

[Series 5: cor soft · coronal · 0.35mm/px · 3 of 74 slices shown]
[im 25/74  brain]
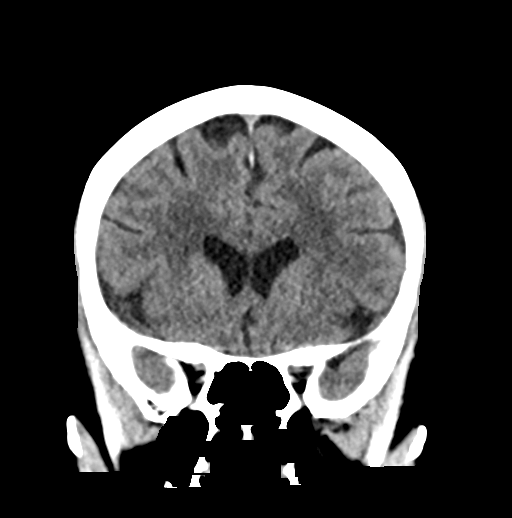
[im 33/74  brain]
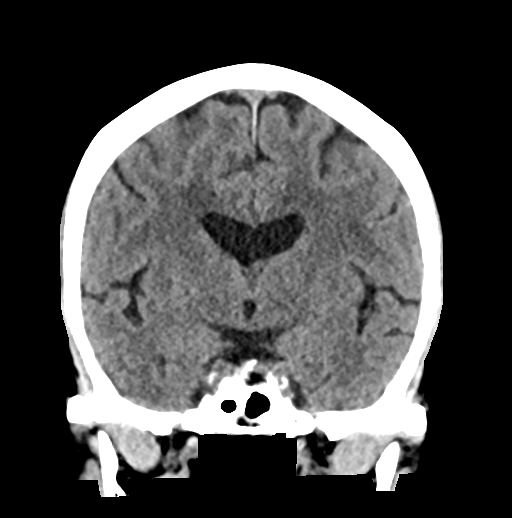
[im 41/74  brain]
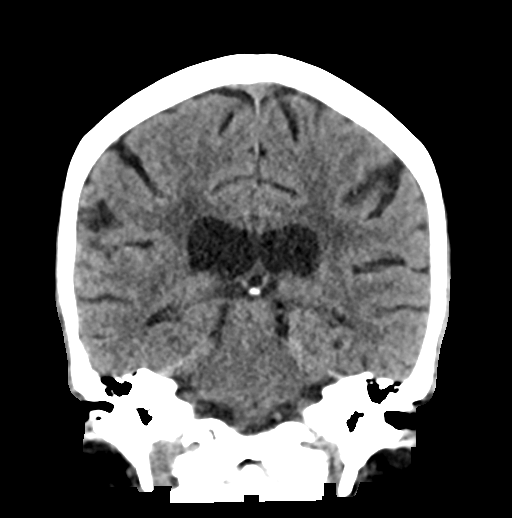

[Series 6: sag soft · sagittal · 0.33mm/px · 3 of 53 slices shown]
[im 18/53  brain]
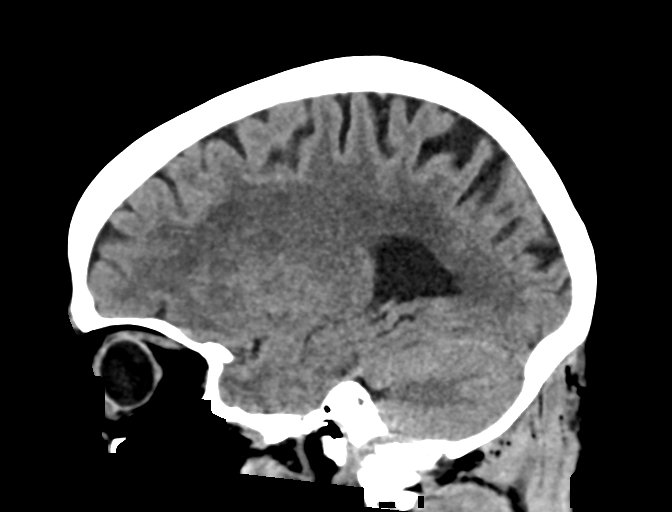
[im 27/53  brain]
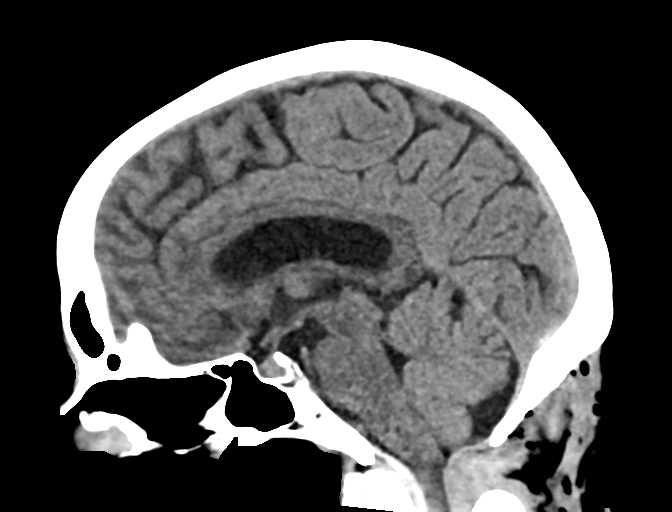
[im 35/53  brain]
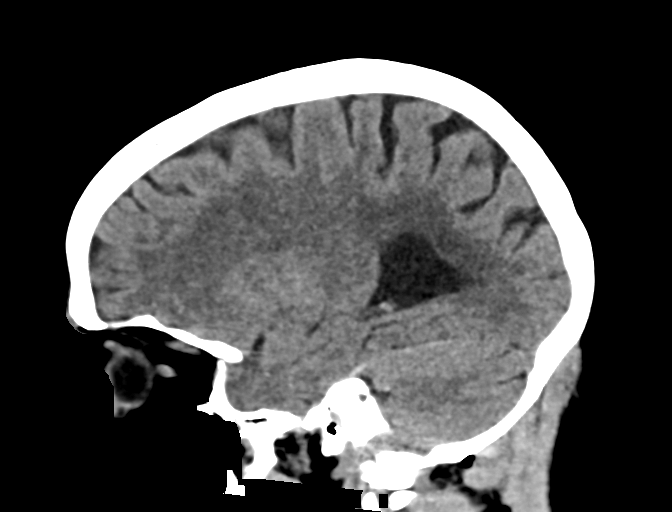

[16 of 47 positions shown; findings below may reference images not displayed]

FINDINGS: Brain: Generalized age-related cerebral atrophy with moderate
chronic small vessel ischemic disease, stable. No acute intracranial
hemorrhage. No acute large vessel territory infarct. No mass lesion,
midline shift or mass effect. No hydrocephalus. No extra-axial fluid
collection.

Vascular: No hyperdense vessel. Scattered vascular calcifications
noted within the carotid siphons.

Skull: Postoperative changes from recent C1-C2 laminectomy, better
evaluated on concomitant CT of the cervical spine. Associated
postoperative soft tissue swelling and emphysema within the
suboccipital region and upper posterior neck. Calvarium intact.
Small lipoma noted at the left forehead, stable.

Sinuses/Orbits: Globes and orbital soft tissues within normal
limits. Paranasal sinuses are clear. No mastoid effusion.

Other: None.
IMPRESSION: 1. No acute intracranial abnormality.
2. Postoperative changes from recent C1 laminectomy with synovial
cyst resection, better evaluated on concomitant CT of the cervical
spine.
3. Age-related cerebral atrophy with moderate chronic microvascular
ischemic disease, stable.

## 2020-09-16 IMAGING — CT CT CERVICAL SPINE W/O CM
3 of 4 series · 12 of 33 positions shown, 14 images · non-contrast
Comparison: Comparison made with prior CT and MRI from 12/29/2018.

CLINICAL DATA: Initial evaluation for increasing left upper
extremity weakness, history of recent C1 laminectomy with synovial
cyst resection.

EXAM:
CT CERVICAL SPINE WITHOUT CONTRAST
TECHNIQUE: Multidetector CT imaging of the cervical spine was performed without
intravenous contrast. Multiplanar CT image reconstructions were also
generated.

[Series 8: sag bone · sagittal · 0.26mm/px · 5 of 84 slices shown, 6 images]
[im 28/84  bone]
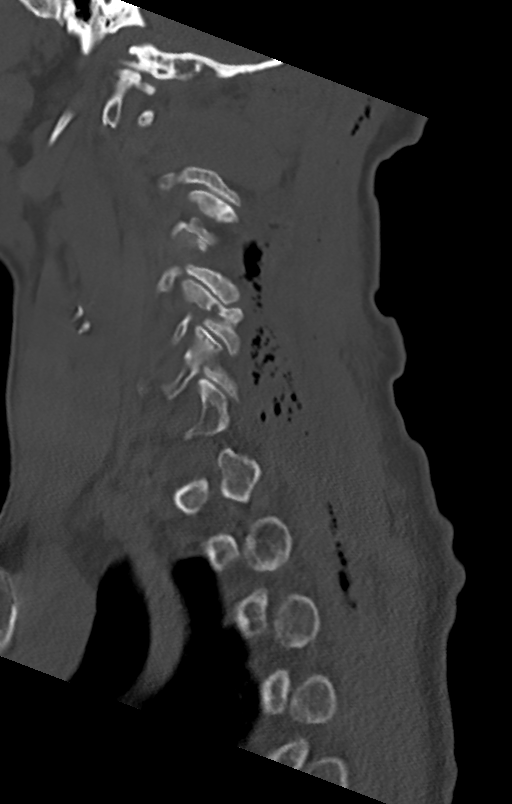
[im 35/84  bone]
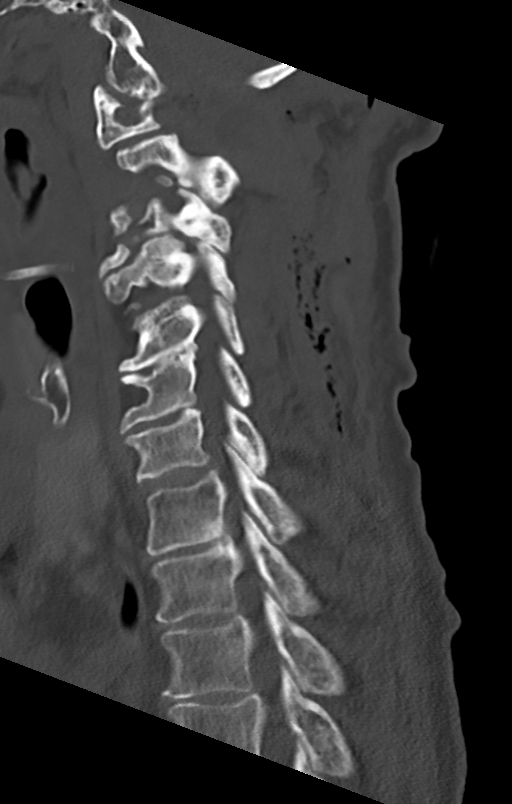
[im 42/84  soft-tissue]
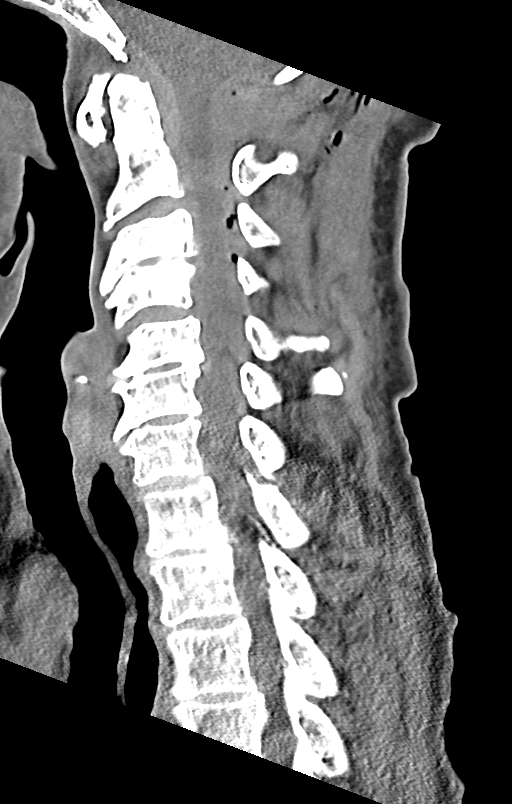
[im 42/84  bone]
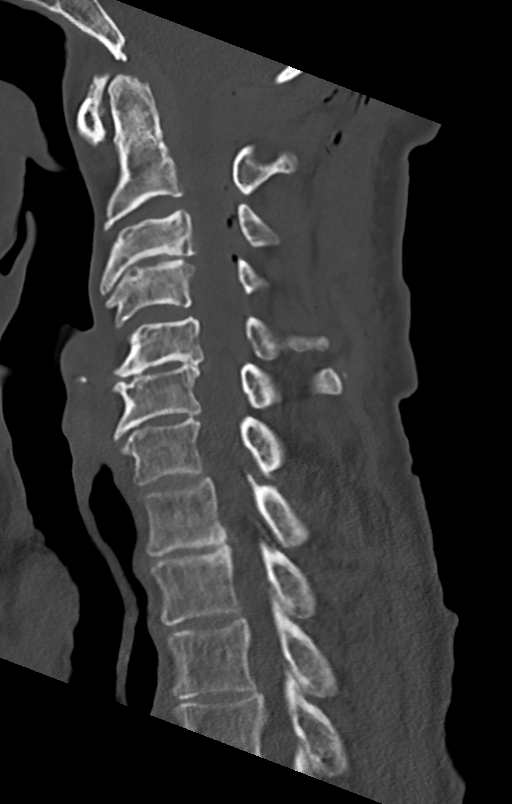
[im 49/84  bone]
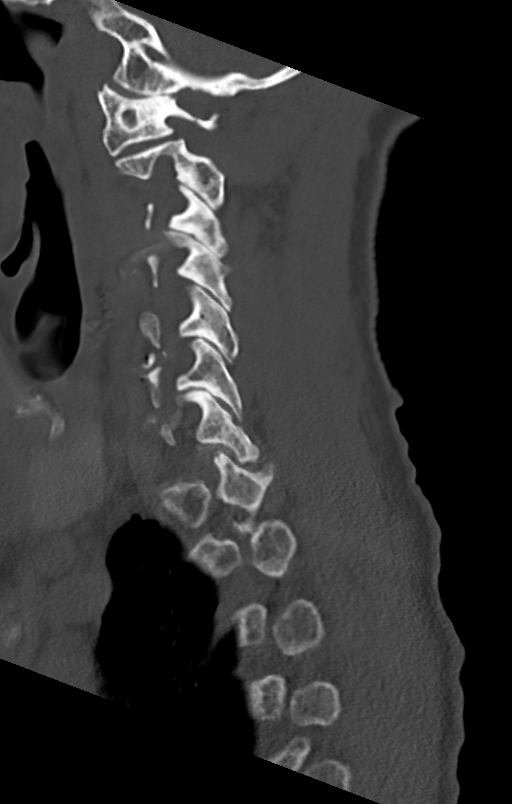
[im 56/84  bone]
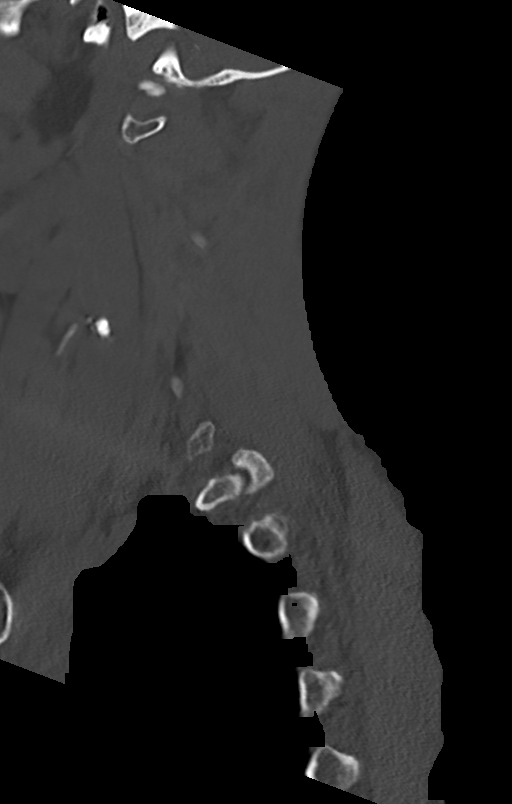

[Series 9: cor bone · coronal · 0.33mm/px · 3 of 59 slices shown]
[im 13/59  bone]
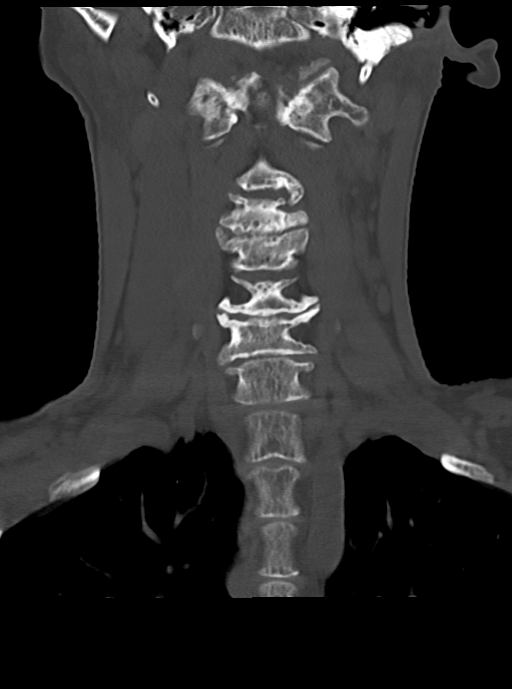
[im 24/59  bone]
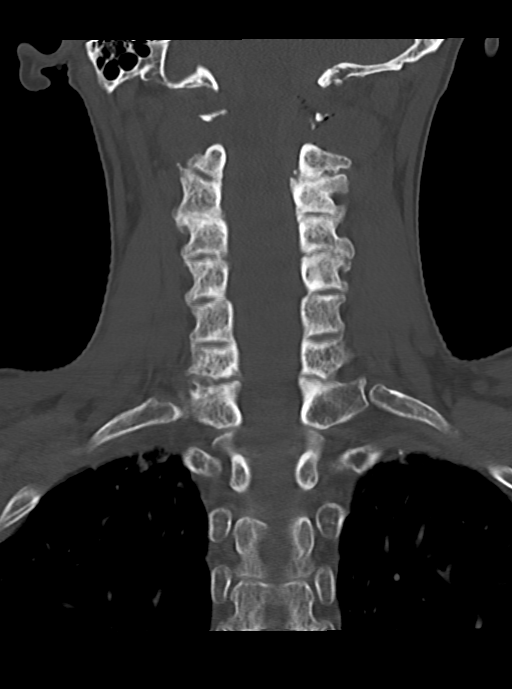
[im 35/59  bone]
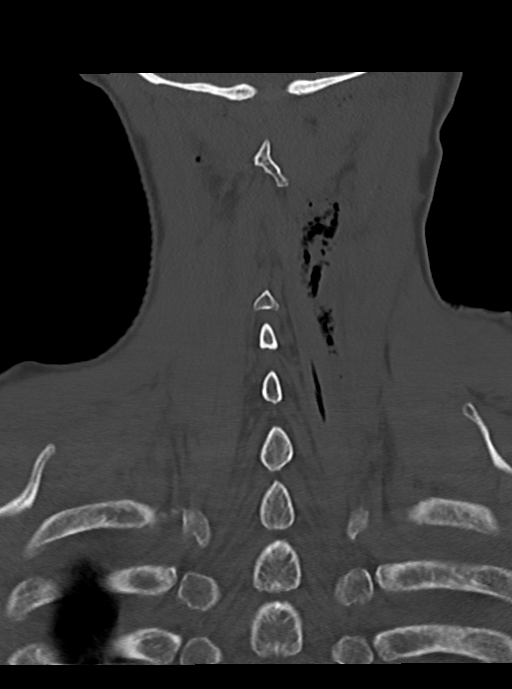

[Series 10: orthogonal axials · axial · 0.21mm/px · z∈[-263,-161]mm · 4 of 85 slices shown, 5 images]
[im 15/85  soft-tissue]
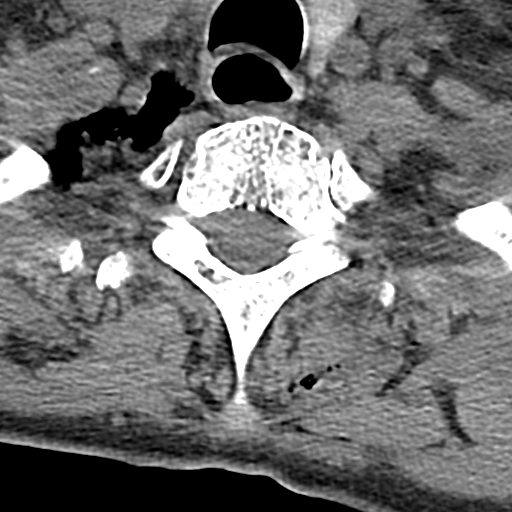
[im 15/85  bone]
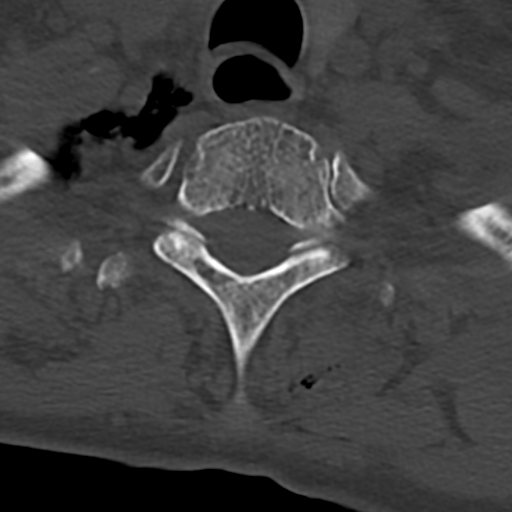
[im 29/85  bone]
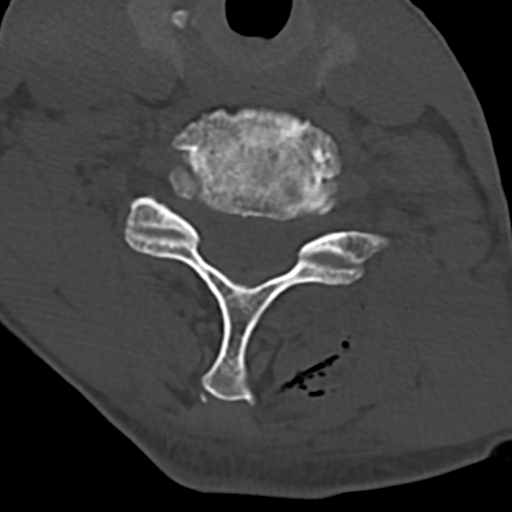
[im 57/85  bone]
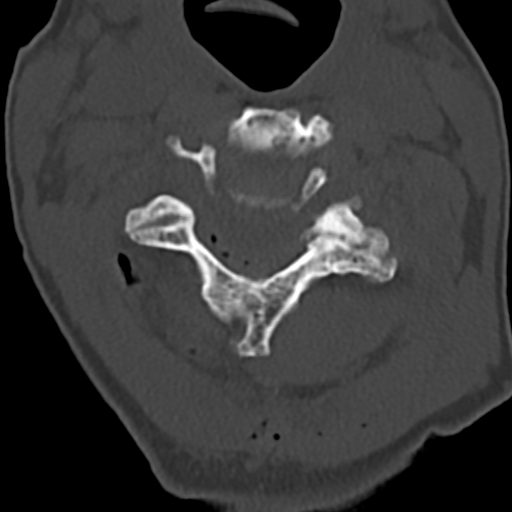
[im 71/85  bone]
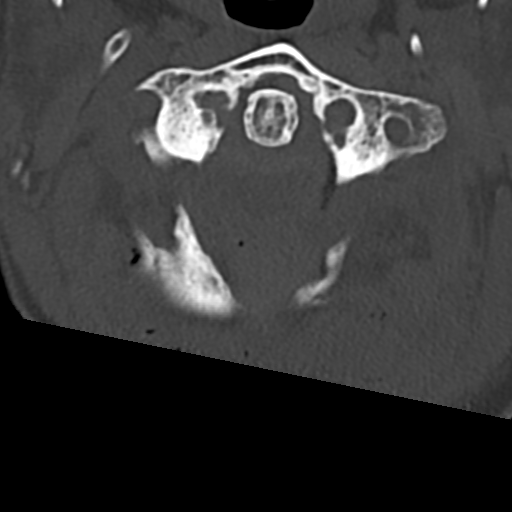

[12 of 33 positions shown; findings below may reference images not displayed]

FINDINGS: Alignment: Straightening of the normal cervical lordosis without
listhesis or subluxation, stable. No interval malalignment.

Skull base and vertebrae: Postoperative changes from interval wide
decompressive C1 laminectomy. Normal expected postoperative swelling
with emphysema present within the suboccipital region and upper
posterior neck about the operative site. No frank soft tissue
hematoma or other complication. Previously seen synovial cyst
posterior to the dens and tectorial membrane is not definitely seen,
consistent with interval resection. Circumferential hyperdense
material surrounds the upper spinal cord and thecal sac at the level
of the C1 operative site (series 5, image 11). Unclear whether this
reflects postoperative change with Gelfoam packing or possibly
epidural hemorrhage. Probable residual degenerative synovial pannus
thickening at the tectorial membrane contribute somewhat as well.
There is resultant moderate to severe spinal stenosis, worse on the
left where the thecal sac measures approximately 5 mm in AP
diameter. Additional hyperdense material extends inferiorly along
the dorsal epidural space, likely a small amount of epidural
hemorrhage. This extends to approximately the level of C3-4. Few
scattered foci of associated emphysema.

Soft tissues and spinal canal: Postoperative changes involving the
upper cervical spine as above. Paraspinous soft tissues demonstrate
no other significant finding. Prominent atherosclerotic change about
the carotid bifurcations.

Disc levels: Moderate multilevel degenerative spondylolysis again
noted, unchanged from previous exam. No new or progressive finding.

Upper chest: Visualized upper chest demonstrates no acute finding.

Other: None.
IMPRESSION: 1. Postoperative changes from interval wide decompressive C1
laminectomy and synovial cyst resection. Circumferential hyperdense
material surrounds the upper spinal cord and thecal sac at the level
of the C1 operative site. Unclear whether this reflects
postoperative change with Gelfoam packing or possibly epidural
hemorrhage. Associated moderate to severe spinal stenosis, worse on
the left. At time of this dictation, patient is currently in the OR
undergoing re-exploration of these findings.
2. Small amount of epidural hemorrhage extending inferiorly along
the dorsal epidural space to the level of C3-4.
3. Otherwise stable appearance of the cervical spine with no other
acute finding identified.

## 2022-10-08 ENCOUNTER — Encounter (HOSPITAL_COMMUNITY): Payer: Self-pay

## 2022-10-08 ENCOUNTER — Inpatient Hospital Stay (HOSPITAL_COMMUNITY)
Admit: 2022-10-08 | Discharge: 2022-10-13 | DRG: 872 | Disposition: A | Discharge status: Home Health | Payer: Medicare HMO | Attending: Internal Medicine | Admitting: Internal Medicine

## 2022-10-08 DIAGNOSIS — Z981 Arthrodesis status: Secondary | ICD-10-CM

## 2022-10-08 DIAGNOSIS — Z90710 Acquired absence of both cervix and uterus: Secondary | ICD-10-CM | POA: Diagnosis not present

## 2022-10-08 DIAGNOSIS — R6510 Systemic inflammatory response syndrome (SIRS) of non-infectious origin without acute organ dysfunction: Secondary | ICD-10-CM | POA: Diagnosis present

## 2022-10-08 DIAGNOSIS — Z9841 Cataract extraction status, right eye: Secondary | ICD-10-CM | POA: Diagnosis not present

## 2022-10-08 DIAGNOSIS — D733 Abscess of spleen: Secondary | ICD-10-CM | POA: Diagnosis present

## 2022-10-08 DIAGNOSIS — E860 Dehydration: Secondary | ICD-10-CM | POA: Diagnosis present

## 2022-10-08 DIAGNOSIS — Z7982 Long term (current) use of aspirin: Secondary | ICD-10-CM | POA: Diagnosis not present

## 2022-10-08 DIAGNOSIS — M7981 Nontraumatic hematoma of soft tissue: Secondary | ICD-10-CM | POA: Diagnosis present

## 2022-10-08 DIAGNOSIS — E039 Hypothyroidism, unspecified: Secondary | ICD-10-CM | POA: Diagnosis present

## 2022-10-08 DIAGNOSIS — Z8249 Family history of ischemic heart disease and other diseases of the circulatory system: Secondary | ICD-10-CM

## 2022-10-08 DIAGNOSIS — Z9842 Cataract extraction status, left eye: Secondary | ICD-10-CM | POA: Diagnosis not present

## 2022-10-08 DIAGNOSIS — H409 Unspecified glaucoma: Secondary | ICD-10-CM | POA: Diagnosis present

## 2022-10-08 DIAGNOSIS — Z885 Allergy status to narcotic agent status: Secondary | ICD-10-CM | POA: Diagnosis not present

## 2022-10-08 DIAGNOSIS — I5031 Acute diastolic (congestive) heart failure: Secondary | ICD-10-CM | POA: Diagnosis not present

## 2022-10-08 DIAGNOSIS — E876 Hypokalemia: Secondary | ICD-10-CM | POA: Diagnosis present

## 2022-10-08 DIAGNOSIS — D473 Essential (hemorrhagic) thrombocythemia: Secondary | ICD-10-CM | POA: Diagnosis present

## 2022-10-08 DIAGNOSIS — D45 Polycythemia vera: Secondary | ICD-10-CM | POA: Diagnosis present

## 2022-10-08 DIAGNOSIS — Z9851 Tubal ligation status: Secondary | ICD-10-CM | POA: Diagnosis not present

## 2022-10-08 DIAGNOSIS — Z79890 Hormone replacement therapy (postmenopausal): Secondary | ICD-10-CM | POA: Diagnosis not present

## 2022-10-08 DIAGNOSIS — K651 Peritoneal abscess: Secondary | ICD-10-CM | POA: Diagnosis present

## 2022-10-08 DIAGNOSIS — I10 Essential (primary) hypertension: Secondary | ICD-10-CM | POA: Diagnosis present

## 2022-10-08 DIAGNOSIS — A419 Sepsis, unspecified organism: Secondary | ICD-10-CM | POA: Diagnosis not present

## 2022-10-08 DIAGNOSIS — E871 Hypo-osmolality and hyponatremia: Secondary | ICD-10-CM | POA: Diagnosis present

## 2022-10-08 DIAGNOSIS — Z96641 Presence of right artificial hip joint: Secondary | ICD-10-CM | POA: Diagnosis present

## 2022-10-08 DIAGNOSIS — Z79899 Other long term (current) drug therapy: Secondary | ICD-10-CM | POA: Diagnosis not present

## 2022-10-09 ENCOUNTER — Encounter (HOSPITAL_COMMUNITY): Payer: Self-pay | Admitting: Internal Medicine

## 2022-10-09 ENCOUNTER — Inpatient Hospital Stay (HOSPITAL_COMMUNITY): Payer: Medicare HMO

## 2022-10-09 DIAGNOSIS — I10 Essential (primary) hypertension: Secondary | ICD-10-CM | POA: Diagnosis not present

## 2022-10-09 DIAGNOSIS — A419 Sepsis, unspecified organism: Secondary | ICD-10-CM

## 2022-10-09 DIAGNOSIS — D733 Abscess of spleen: Secondary | ICD-10-CM | POA: Diagnosis not present

## 2022-10-09 DIAGNOSIS — E871 Hypo-osmolality and hyponatremia: Secondary | ICD-10-CM

## 2022-10-09 DIAGNOSIS — E039 Hypothyroidism, unspecified: Secondary | ICD-10-CM | POA: Diagnosis present

## 2022-10-09 DIAGNOSIS — E876 Hypokalemia: Secondary | ICD-10-CM

## 2022-10-09 DIAGNOSIS — D45 Polycythemia vera: Secondary | ICD-10-CM

## 2022-10-09 LAB — CBC WITH DIFFERENTIAL/PLATELET
Abs Immature Granulocytes: 0.21 10*3/uL — ABNORMAL HIGH (ref 0.00–0.07)
Basophils Absolute: 0.3 10*3/uL — ABNORMAL HIGH (ref 0.0–0.1)
Basophils Relative: 1 %
Eosinophils Absolute: 0.1 10*3/uL (ref 0.0–0.5)
Eosinophils Relative: 1 %
HCT: 28.4 % — ABNORMAL LOW (ref 36.0–46.0)
Hemoglobin: 9 g/dL — ABNORMAL LOW (ref 12.0–15.0)
Immature Granulocytes: 1 %
Lymphocytes Relative: 6 %
Lymphs Abs: 1.4 10*3/uL (ref 0.7–4.0)
MCH: 35 pg — ABNORMAL HIGH (ref 26.0–34.0)
MCHC: 31.7 g/dL (ref 30.0–36.0)
MCV: 110.5 fL — ABNORMAL HIGH (ref 80.0–100.0)
Monocytes Absolute: 1.8 10*3/uL — ABNORMAL HIGH (ref 0.1–1.0)
Monocytes Relative: 8 %
Neutro Abs: 18.6 10*3/uL — ABNORMAL HIGH (ref 1.7–7.7)
Neutrophils Relative %: 83 %
Platelets: 898 10*3/uL — ABNORMAL HIGH (ref 150–400)
RBC: 2.57 MIL/uL — ABNORMAL LOW (ref 3.87–5.11)
RDW: 26.5 % — ABNORMAL HIGH (ref 11.5–15.5)
WBC: 22.4 10*3/uL — ABNORMAL HIGH (ref 4.0–10.5)
nRBC: 2 % — ABNORMAL HIGH (ref 0.0–0.2)

## 2022-10-09 LAB — COMPREHENSIVE METABOLIC PANEL
ALT: 21 U/L (ref 0–44)
AST: 28 U/L (ref 15–41)
Albumin: 3 g/dL — ABNORMAL LOW (ref 3.5–5.0)
Alkaline Phosphatase: 166 U/L — ABNORMAL HIGH (ref 38–126)
Anion gap: 14 (ref 5–15)
BUN: 8 mg/dL (ref 8–23)
CO2: 25 mmol/L (ref 22–32)
Calcium: 8.8 mg/dL — ABNORMAL LOW (ref 8.9–10.3)
Chloride: 95 mmol/L — ABNORMAL LOW (ref 98–111)
Creatinine, Ser: 0.76 mg/dL (ref 0.44–1.00)
GFR, Estimated: >60 mL/min (ref >60)
Glucose, Bld: 85 mg/dL (ref 70–99)
Potassium: 2.8 mmol/L — ABNORMAL LOW (ref 3.5–5.1)
Sodium: 134 mmol/L — ABNORMAL LOW (ref 135–145)
Total Bilirubin: 0.7 mg/dL (ref 0.3–1.2)
Total Protein: 6.6 g/dL (ref 6.5–8.1)

## 2022-10-09 LAB — IRON AND TIBC
Iron: 28 ug/dL (ref 28–170)
Saturation Ratios: 8 % — ABNORMAL LOW (ref 10.4–31.8)
TIBC: 333 ug/dL (ref 250–450)
UIBC: 305 ug/dL

## 2022-10-09 LAB — LACTIC ACID, PLASMA: Lactic Acid, Venous: 0.8 mmol/L (ref 0.5–1.9)

## 2022-10-09 LAB — BASIC METABOLIC PANEL
Anion gap: 8 (ref 5–15)
BUN: 6 mg/dL — ABNORMAL LOW (ref 8–23)
CO2: 23 mmol/L (ref 22–32)
Calcium: 8.1 mg/dL — ABNORMAL LOW (ref 8.9–10.3)
Chloride: 99 mmol/L (ref 98–111)
Creatinine, Ser: 0.83 mg/dL (ref 0.44–1.00)
GFR, Estimated: >60 mL/min (ref >60)
Glucose, Bld: 153 mg/dL — ABNORMAL HIGH (ref 70–99)
Potassium: 4.8 mmol/L (ref 3.5–5.1)
Sodium: 130 mmol/L — ABNORMAL LOW (ref 135–145)

## 2022-10-09 LAB — SODIUM, URINE, RANDOM: Sodium, Ur: 146 mmol/L

## 2022-10-09 LAB — FOLATE: Folate: >40 ng/mL (ref >5.9)

## 2022-10-09 LAB — OSMOLALITY: Osmolality: 275 mOsm/kg (ref 275–295)

## 2022-10-09 LAB — TYPE AND SCREEN
ABO/RH(D): A POS
Antibody Screen: NEGATIVE

## 2022-10-09 LAB — PHOSPHORUS: Phosphorus: 3 mg/dL (ref 2.5–4.6)

## 2022-10-09 LAB — TSH: TSH: 6.055 u[IU]/mL — ABNORMAL HIGH (ref 0.350–4.500)

## 2022-10-09 LAB — VITAMIN D 25 HYDROXY (VIT D DEFICIENCY, FRACTURES): Vit D, 25-Hydroxy: 28.63 ng/mL — ABNORMAL LOW (ref 30–100)

## 2022-10-09 LAB — MAGNESIUM: Magnesium: 2.1 mg/dL (ref 1.7–2.4)

## 2022-10-09 LAB — OSMOLALITY, URINE: Osmolality, Ur: 513 mOsm/kg (ref 300–900)

## 2022-10-09 LAB — CREATININE, URINE, RANDOM: Creatinine, Urine: 92 mg/dL

## 2022-10-09 MED ORDER — ACETAMINOPHEN 325 MG PO TABS
650.0000 mg | ORAL_TABLET | Freq: Four times a day (QID) | ORAL | Status: DC | PRN
  Administered 2022-10-09: 650 mg via ORAL
  Filled 2022-10-09: qty 2

## 2022-10-09 MED ORDER — SODIUM CHLORIDE 0.9 % IV SOLN: INTRAVENOUS | Status: AC

## 2022-10-09 MED ORDER — NALOXONE HCL 0.4 MG/ML IJ SOLN: 0.4000 mg | INTRAMUSCULAR | Status: DC | PRN

## 2022-10-09 MED ORDER — LEVOTHYROXINE SODIUM 50 MCG PO TABS
50.0000 ug | ORAL_TABLET | Freq: Every day | ORAL | Status: DC
  Administered 2022-10-09 – 2022-10-10 (×2): 50 ug via ORAL
  Filled 2022-10-09 (×2): qty 1

## 2022-10-09 MED ORDER — MORPHINE SULFATE (PF) 2 MG/ML IV SOLN
2.0000 mg | INTRAVENOUS | Status: DC | PRN
  Administered 2022-10-09 – 2022-10-12 (×8): 2 mg via INTRAVENOUS
  Filled 2022-10-09 (×11): qty 1

## 2022-10-09 MED ORDER — POTASSIUM CHLORIDE 10 MEQ/100ML IV SOLN
10.0000 meq | INTRAVENOUS | Status: AC
  Administered 2022-10-09 (×3): 10 meq via INTRAVENOUS
  Filled 2022-10-09 (×3): qty 100

## 2022-10-09 MED ORDER — LIDOCAINE HCL 1 % IJ SOLN
INTRAMUSCULAR | Status: AC
  Filled 2022-10-09: qty 10

## 2022-10-09 MED ORDER — MIDAZOLAM HCL 2 MG/2ML IJ SOLN
INTRAMUSCULAR | Status: AC | PRN
Start: 2022-10-09 — End: 2022-10-09
  Administered 2022-10-09: 1 mg via INTRAVENOUS

## 2022-10-09 MED ORDER — ONDANSETRON HCL 4 MG/2ML IJ SOLN: 4.0000 mg | Freq: Four times a day (QID) | INTRAMUSCULAR | Status: DC | PRN

## 2022-10-09 MED ORDER — SODIUM CHLORIDE 0.9 % IV SOLN
2.0000 g | Freq: Every day | INTRAVENOUS | Status: DC
  Administered 2022-10-09 – 2022-10-11 (×4): 2 g via INTRAVENOUS
  Filled 2022-10-09 (×4): qty 20

## 2022-10-09 MED ORDER — HYDRALAZINE HCL 50 MG PO TABS: 50.0000 mg | ORAL_TABLET | Freq: Four times a day (QID) | ORAL | Status: DC | PRN

## 2022-10-09 MED ORDER — HYDROXYUREA 500 MG PO CAPS
1000.0000 mg | ORAL_CAPSULE | Freq: Every day | ORAL | Status: DC
  Administered 2022-10-09 – 2022-10-13 (×5): 1000 mg via ORAL
  Filled 2022-10-09 (×5): qty 2

## 2022-10-09 MED ORDER — OXYCODONE HCL 5 MG PO TABS
5.0000 mg | ORAL_TABLET | Freq: Four times a day (QID) | ORAL | Status: DC | PRN
  Administered 2022-10-09 – 2022-10-12 (×7): 5 mg via ORAL
  Filled 2022-10-09 (×7): qty 1

## 2022-10-09 MED ORDER — MIDAZOLAM HCL 2 MG/2ML IJ SOLN
INTRAMUSCULAR | Status: AC
  Filled 2022-10-09: qty 2

## 2022-10-09 MED ORDER — LISINOPRIL 5 MG PO TABS: 10.0000 mg | ORAL_TABLET | Freq: Every day | ORAL | Status: DC

## 2022-10-09 MED ORDER — HYDRALAZINE HCL 20 MG/ML IJ SOLN: 10.0000 mg | Freq: Four times a day (QID) | INTRAMUSCULAR | Status: DC | PRN

## 2022-10-09 MED ORDER — ACETAMINOPHEN 650 MG RE SUPP: 650.0000 mg | Freq: Four times a day (QID) | RECTAL | Status: DC | PRN

## 2022-10-09 MED ORDER — HYDROXYUREA 500 MG PO CAPS
500.0000 mg | ORAL_CAPSULE | Freq: Every evening | ORAL | Status: DC
  Administered 2022-10-09 – 2022-10-12 (×4): 500 mg via ORAL
  Filled 2022-10-09 (×4): qty 1

## 2022-10-09 MED ORDER — ASPIRIN 81 MG PO TBEC
81.0000 mg | DELAYED_RELEASE_TABLET | Freq: Every day | ORAL | Status: DC
  Administered 2022-10-10 – 2022-10-13 (×4): 81 mg via ORAL
  Filled 2022-10-09 (×4): qty 1

## 2022-10-09 MED ORDER — FENTANYL CITRATE PF 50 MCG/ML IJ SOSY
25.0000 ug | PREFILLED_SYRINGE | INTRAMUSCULAR | Status: DC | PRN
  Administered 2022-10-09: 25 ug via INTRAVENOUS
  Filled 2022-10-09: qty 1

## 2022-10-09 MED ORDER — POTASSIUM CHLORIDE 10 MEQ/100ML IV SOLN
10.0000 meq | INTRAVENOUS | Status: AC
  Administered 2022-10-09 (×4): 10 meq via INTRAVENOUS
  Filled 2022-10-09 (×4): qty 100

## 2022-10-09 MED ORDER — FENTANYL CITRATE (PF) 100 MCG/2ML IJ SOLN
INTRAMUSCULAR | Status: AC
  Filled 2022-10-09: qty 2

## 2022-10-09 MED ORDER — METRONIDAZOLE 500 MG/100ML IV SOLN
500.0000 mg | Freq: Two times a day (BID) | INTRAVENOUS | Status: DC
  Administered 2022-10-09 – 2022-10-12 (×8): 500 mg via INTRAVENOUS
  Filled 2022-10-09 (×8): qty 100
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
## 2022-10-09 NOTE — Plan of Care (Signed)
Pt admitted from OSH with abdominal pain, with hx of HTN. Pt alert & oriented x 4. Vital signs stable. No complaints of pain/distress voiced at this time. Pt oriented to room and call light. Meds given as ordered (see MAR). See Flowsheet for detailed assessment and frequent vitals. Pt resting quietly in bed, call light in reach. Will continue with plan of care.    Problem: Education: Goal: Knowledge of General Education information will improve Description: Including pain rating scale, medication(s)/side effects and non-pharmacologic comfort measures Outcome: Progressing   Problem: Health Behavior/Discharge Planning: Goal: Ability to manage health-related needs will improve Outcome: Progressing   Problem: Clinical Measurements: Goal: Ability to maintain clinical measurements within normal limits will improve Outcome: Progressing Goal: Will remain free from infection Outcome: Progressing Goal: Diagnostic test results will improve Outcome: Progressing Goal: Respiratory complications will improve Outcome: Progressing Goal: Cardiovascular complication will be avoided Outcome: Progressing   Problem: Activity: Goal: Risk for activity intolerance will decrease Outcome: Progressing   Problem: Nutrition: Goal: Adequate nutrition will be maintained Outcome: Progressing   Problem: Coping: Goal: Level of anxiety will decrease Outcome: Progressing   Problem: Elimination: Goal: Will not experience complications related to bowel motility Outcome: Progressing Goal: Will not experience complications related to urinary retention Outcome: Progressing   Problem: Pain Managment: Goal: General experience of comfort will improve Outcome: Progressing   Problem: Safety: Goal: Ability to remain free from injury will improve Outcome: Progressing   Problem: Skin Integrity: Goal: Risk for impaired skin integrity will decrease Outcome: Progressing

## 2022-10-09 NOTE — Plan of Care (Signed)
Splenic abscess Patient was seen and examined at bedside, patient was seen after IR guided drain placement.  Continue as needed medication for pain control.  Follow fluid culture, continue empiric antibiotics.  Consider ID consult when cultures will be back. Hypokalemia, potassium repleted.  Monitor electrolytes. Hypertension, continue home medications and monitor BP We will continue current treatment and follow along.
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
## 2022-10-09 NOTE — Procedures (Signed)
Interventional Radiology Procedure Note  Procedure: Image guided drain placement, perisplenic fluid.  20F pigtail drain.  Findings: Remote blood products, mostly coagulated.  Drainage of doubtful utility for evacuation Cx sent  Complications: None  EBL: None Sample: Culture sent  Recommendations: - Routine drain care, with sterile flushes, record output - follow up Cx - routine wound care - OK to advance diet per primary order - Drainage of doubtful utility for evacuation of hematoma. Once cx returns, advise removal immediately if sterile.   Signed,  Yvone Neu. Loreta Ave, DO

## 2022-10-10 ENCOUNTER — Inpatient Hospital Stay (HOSPITAL_COMMUNITY): Payer: Medicare HMO

## 2022-10-10 DIAGNOSIS — I5031 Acute diastolic (congestive) heart failure: Secondary | ICD-10-CM | POA: Diagnosis not present

## 2022-10-10 DIAGNOSIS — D45 Polycythemia vera: Secondary | ICD-10-CM | POA: Diagnosis not present

## 2022-10-10 DIAGNOSIS — D733 Abscess of spleen: Secondary | ICD-10-CM | POA: Diagnosis not present

## 2022-10-10 LAB — CBC WITH DIFFERENTIAL/PLATELET
Abs Immature Granulocytes: 0 10*3/uL (ref 0.00–0.07)
Basophils Absolute: 0.5 10*3/uL — ABNORMAL HIGH (ref 0.0–0.1)
Basophils Relative: 3 %
Eosinophils Absolute: 0.2 10*3/uL (ref 0.0–0.5)
Eosinophils Relative: 1 %
HCT: 27.7 % — ABNORMAL LOW (ref 36.0–46.0)
Hemoglobin: 9 g/dL — ABNORMAL LOW (ref 12.0–15.0)
Lymphocytes Relative: 4 %
Lymphs Abs: 0.7 10*3/uL (ref 0.7–4.0)
MCH: 37.3 pg — ABNORMAL HIGH (ref 26.0–34.0)
MCHC: 32.5 g/dL (ref 30.0–36.0)
MCV: 114.9 fL — ABNORMAL HIGH (ref 80.0–100.0)
Monocytes Absolute: 2.2 10*3/uL — ABNORMAL HIGH (ref 0.1–1.0)
Monocytes Relative: 12 %
Neutro Abs: 14.4 10*3/uL — ABNORMAL HIGH (ref 1.7–7.7)
Neutrophils Relative %: 80 %
Platelets: 917 10*3/uL (ref 150–400)
RBC: 2.41 MIL/uL — ABNORMAL LOW (ref 3.87–5.11)
RDW: 25.7 % — ABNORMAL HIGH (ref 11.5–15.5)
WBC: 18 10*3/uL — ABNORMAL HIGH (ref 4.0–10.5)
nRBC: 2.9 % — ABNORMAL HIGH (ref 0.0–0.2)
nRBC: 6 /100{WBCs} — ABNORMAL HIGH

## 2022-10-10 LAB — COMPREHENSIVE METABOLIC PANEL WITH GFR
ALT: 21 U/L (ref 0–44)
AST: 33 U/L (ref 15–41)
Albumin: 2.7 g/dL — ABNORMAL LOW (ref 3.5–5.0)
Alkaline Phosphatase: 226 U/L — ABNORMAL HIGH (ref 38–126)
Anion gap: 11 (ref 5–15)
BUN: 9 mg/dL (ref 8–23)
CO2: 21 mmol/L — ABNORMAL LOW (ref 22–32)
Calcium: 8.3 mg/dL — ABNORMAL LOW (ref 8.9–10.3)
Chloride: 98 mmol/L (ref 98–111)
Creatinine, Ser: 0.75 mg/dL (ref 0.44–1.00)
GFR, Estimated: >60 mL/min (ref >60)
Glucose, Bld: 102 mg/dL — ABNORMAL HIGH (ref 70–99)
Potassium: 3.5 mmol/L (ref 3.5–5.1)
Sodium: 130 mmol/L — ABNORMAL LOW (ref 135–145)
Total Bilirubin: 0.5 mg/dL (ref 0.3–1.2)
Total Protein: 6 g/dL — ABNORMAL LOW (ref 6.5–8.1)

## 2022-10-10 LAB — C-REACTIVE PROTEIN: CRP: 15.8 mg/dL — ABNORMAL HIGH (ref <1.0)

## 2022-10-10 LAB — URIC ACID: Uric Acid, Serum: 5.9 mg/dL (ref 2.5–7.1)

## 2022-10-10 LAB — PHOSPHORUS: Phosphorus: 2.8 mg/dL (ref 2.5–4.6)

## 2022-10-10 LAB — PROCALCITONIN: Procalcitonin: 0.5 ng/mL

## 2022-10-10 LAB — ECHOCARDIOGRAM COMPLETE
Height: 66 in
S' Lateral: 2.9 cm
Weight: 2038.81 oz

## 2022-10-10 LAB — OSMOLALITY: Osmolality: 279 mOsm/kg (ref 275–295)

## 2022-10-10 LAB — BRAIN NATRIURETIC PEPTIDE: B Natriuretic Peptide: 107 pg/mL — ABNORMAL HIGH (ref 0.0–100.0)

## 2022-10-10 LAB — MAGNESIUM: Magnesium: 1.9 mg/dL (ref 1.7–2.4)

## 2022-10-10 MED ORDER — BRIMONIDINE TARTRATE 0.2 % OP SOLN
1.0000 [drp] | Freq: Three times a day (TID) | OPHTHALMIC | Status: DC
  Administered 2022-10-10 – 2022-10-13 (×8): 1 [drp] via OPHTHALMIC
  Filled 2022-10-10: qty 5

## 2022-10-10 MED ORDER — LEVOTHYROXINE SODIUM 75 MCG PO TABS
75.0000 ug | ORAL_TABLET | ORAL | Status: DC
  Administered 2022-10-11 – 2022-10-13 (×2): 75 ug via ORAL
  Filled 2022-10-10 (×2): qty 1

## 2022-10-10 MED ORDER — LEVOTHYROXINE SODIUM 50 MCG PO TABS
50.0000 ug | ORAL_TABLET | ORAL | Status: DC
  Administered 2022-10-12: 50 ug via ORAL
  Filled 2022-10-10 (×2): qty 1

## 2022-10-10 MED ORDER — LORATADINE 10 MG PO TABS
10.0000 mg | ORAL_TABLET | Freq: Every day | ORAL | Status: DC
  Administered 2022-10-10 – 2022-10-13 (×4): 10 mg via ORAL
  Filled 2022-10-10 (×4): qty 1

## 2022-10-10 MED ORDER — LACTATED RINGERS IV SOLN: INTRAVENOUS | Status: AC

## 2022-10-10 MED ORDER — HYDRALAZINE HCL 20 MG/ML IJ SOLN: 10.0000 mg | Freq: Four times a day (QID) | INTRAMUSCULAR | Status: DC | PRN

## 2022-10-10 MED ORDER — CARVEDILOL 3.125 MG PO TABS
3.1250 mg | ORAL_TABLET | Freq: Two times a day (BID) | ORAL | Status: DC
  Administered 2022-10-10 – 2022-10-13 (×7): 3.125 mg via ORAL
  Filled 2022-10-10 (×7): qty 1

## 2022-10-10 MED ORDER — SODIUM CHLORIDE 0.9% FLUSH
5.0000 mL | Freq: Three times a day (TID) | INTRAVENOUS | Status: DC
  Administered 2022-10-10 – 2022-10-13 (×8): 5 mL

## 2022-10-10 MED ORDER — HEPARIN SODIUM (PORCINE) 5000 UNIT/ML IJ SOLN
5000.0000 [IU] | Freq: Three times a day (TID) | INTRAMUSCULAR | Status: DC
  Administered 2022-10-10 – 2022-10-13 (×9): 5000 [IU] via SUBCUTANEOUS
  Filled 2022-10-10 (×9): qty 1

## 2022-10-10 NOTE — Evaluation (Signed)
Occupational Therapy Evaluation Patient Details Name: Gabrielle Donovan MRN: [id number] DOB: [birth year] Today's Date: 10/10/2022   History of Present Illness Gabrielle Donovan is a 77 y.o. female  who is admitted to [facility] on 10/08/2022 by way of transfer from [facility] ED with concern for perisplenic abscess after presenting with abdominal pain.   Clinical Impression   Pt admitted with the above diagnoses and presents with below problem list. Pt will benefit from continued acute OT to address the below listed deficits and maximize independence with basic ADLs prior to d/c. At baseline, pt is independent with ADLs, does endorse use of rollator during recent beach trip for longer distances. Pt currently needs up to min guard assist with LB ADLs and functional transfers/mobility. Pt needed 1 seated rest break during full in-room ambulation. Will follow acutely.        Recommendations for follow up therapy are one component of a multi-disciplinary discharge planning process, led by the attending physician.  Recommendations may be updated based on patient status, additional functional criteria and insurance authorization.   Assistance Recommended at Discharge PRN  Patient can return home with the following Help with stairs or ramp for entrance    Functional Status Assessment  Patient has had a recent decline in their functional status and demonstrates the ability to make significant improvements in function in a reasonable and predictable amount of time.  Equipment Recommendations  None recommended by OT    Recommendations for Other Services PT consult     Precautions / Restrictions        Mobility Bed Mobility Overal bed mobility: Modified Independent                  Transfers Overall transfer level: Needs assistance Equipment used: None Transfers: Sit to/from Stand Sit to Stand: Min guard           General transfer comment: intially pressing legs into side  of bed for external support in standing. Fatigue.      Balance Overall balance assessment: Needs assistance Sitting-balance support: No upper extremity supported, Feet supported Sitting balance-Leahy Scale: Good     Standing balance support: No upper extremity supported, During functional activity Standing balance-Leahy Scale: Fair Standing balance comment: initially pressing backs of legs into bed for external support in static standing.                           ADL either performed or assessed with clinical judgement   ADL Overall ADL's : Needs assistance/impaired Eating/Feeding: Independent   Grooming: Independent;Sitting   Upper Body Bathing: Set up;Sitting   Lower Body Bathing: Min guard;Sit to/from stand   Upper Body Dressing : Set up;Sitting   Lower Body Dressing: Min guard;Sit to/from stand   Toilet Transfer: Min guard;Supervision/safety;Ambulation;Comfort height toilet   Toileting- Clothing Manipulation and Hygiene: Supervision/safety;Min guard;Set up   Tub/ Shower Transfer: Min guard   Functional mobility during ADLs: Supervision/safety;Min guard General ADL Comments: Pt needed seated rest break after walking to door and back to bedside. After rest walked to opposite side of room to sit up in recliner for a bit.     Vision         Perception     Praxis      Pertinent Vitals/Pain Pain Assessment Pain Assessment: Faces Faces Pain Scale: Hurts even more Pain Location: site of abdominal drain Pain Descriptors / Indicators: Sore Pain Intervention(s): Monitored during session, Repositioned,  Limited activity within patient's tolerance, Patient requesting pain meds-RN notified, Other (comment) (pt requested Tylenol for LUQ soreness)     Hand Dominance     Extremity/Trunk Assessment Upper Extremity Assessment Upper Extremity Assessment: Overall WFL for tasks assessed   Lower Extremity Assessment Lower Extremity Assessment: Defer to PT  evaluation       Communication Communication Communication: No difficulties   Cognition Arousal/Alertness: Awake/alert Behavior During Therapy: WFL for tasks assessed/performed Overall Cognitive Status: Within Functional Limits for tasks assessed                                       General Comments       Exercises     Shoulder Instructions      Home Living Family/patient expects to be discharged to:: Private residence Living Arrangements: Alone Available Help at Discharge: Family Type of Home: Mobile home Home Access: Stairs to enter     Home Layout: One level         Bathroom Toilet: Standard     Home Equipment: Agricultural consultant (2 wheels);Rollator (4 wheels);Gilmer Mor - single point   Additional Comments: Daughter lives in [place], coming into town today. Pt is a retired Lawyer.      Prior Functioning/Environment Prior Level of Function : Independent/Modified Independent             Mobility Comments: rollator for longer distances such as recent beach trip. otherwise no AD used at baseline          OT Problem List: Decreased strength;Decreased activity tolerance;Impaired balance (sitting and/or standing);Decreased knowledge of use of DME or AE;Decreased knowledge of precautions;Pain      OT Treatment/Interventions: Self-care/ADL training;Therapeutic exercise;Energy conservation;DME and/or AE instruction;Therapeutic activities;Balance training;Patient/family education    OT Goals(Current goals can be found in the care plan section) Acute Rehab OT Goals Patient Stated Goal: home OT Goal Formulation: With patient Time For Goal Achievement: 10/24/22 Potential to Achieve Goals: Good ADL Goals Pt Will Perform Grooming: with modified independence;standing;Independently Pt Will Perform Lower Body Bathing: with modified independence;sit to/from stand Pt Will Perform Lower Body Dressing: with modified independence;sit to/from stand Pt Will  Transfer to Toilet: with modified independence;ambulating Pt Will Perform Toileting - Clothing Manipulation and hygiene: with modified independence;sit to/from stand Pt Will Perform Tub/Shower Transfer: with modified independence;ambulating;grab bars  OT Frequency: Min 2X/week    Co-evaluation              AM-PAC OT "6 Clicks" Daily Activity     Outcome Measure Help from another person eating meals?: None Help from another person taking care of personal grooming?: A Little Help from another person toileting, which includes using toliet, bedpan, or urinal?: A Little Help from another person bathing (including washing, rinsing, drying)?: A Little Help from another person to put on and taking off regular upper body clothing?: None Help from another person to put on and taking off regular lower body clothing?: A Little 6 Click Score: 20   End of Session    Activity Tolerance: Patient limited by fatigue;Patient tolerated treatment well Patient left: in chair;with call bell/phone within reach  OT Visit Diagnosis: Unsteadiness on feet (R26.81);Muscle weakness (generalized) (M62.81);Pain                Time: 9629-5284 OT Time Calculation (min): 19 min Charges:  OT General Charges $OT Visit: 1 Visit OT Evaluation $OT Eval Low Complexity: 1  Low  Raynald Kemp, OT Acute Rehabilitation Services Office: [phone]   Pilar Grammes 10/10/2022, 11:12 AM

## 2022-10-10 NOTE — Progress Notes (Addendum)
PROGRESS NOTE                                                                                                                                                                                                             Patient Demographics:    Gabrielle Donovan, is a 77 y.o. female, DOB - 04/22/1945, RUE:[id number]  Outpatient Primary MD for the patient is Pcp, No    LOS - 2  Admit date - 10/08/2022    No chief complaint on file.      Brief Narrative (HPI from H&P)  -  77 y.o. female with medical history significant for essential pretension, acquired hypothyroidism, polycythemia vera, who is admitted to [facility] on 10/08/2022 by way of transfer from [facility] emergency department with concern for perisplenic abscess after presenting from home to [facility] with left upper quadrant abdominal pain for about 2 weeks in duration, workup suggestive of possible perisplenic abscess she was subsequently transferred to [facility].   Subjective:    [name] today has, No headache, No chest pain, No abdominal pain - No Nausea, No new weakness tingling or numbness, no SOB   Assessment  & Plan :   Sepsis due to perisplenic abscess.  Patient had been sick for about 2 weeks prior to this, she was seen by IR underwent left upper quadrant CT-guided drainage with tube placement on 10/09/2022, currently on Rocephin and Flagyl, sepsis pathophysiology is improving, cause still not clear, requested ID to evaluate the patient.   Dehydration with hyponatremia.  Hydrate with IV fluids.  Polycythemia vera: Documented history of such, on anagrelide.   Essential hypertension.  Placed on Coreg.  Hypothyroidism.  On Synthroid.  TSH stable.  3.5 cm simple appearing cystic lesion in the retroperitoneum, likely benign in etiology. Per PCP.        Condition - Extremely Guarded  Family Communication  :  None  Code Status :   Full  Consults  :  IR, ID  PUD Prophylaxis :    Procedures  :     CT-guided spleen drain placement by IR on 10/09/2022.      Disposition Plan  :    Status is: Inpatient  DVT Prophylaxis  :    SCDs Start: 10/09/22 0155    Lab Results  Component Value Date  PLT 917 (HH) 10/10/2022    Diet :  Diet Order             Diet regular Room service appropriate? Yes; Fluid consistency: Thin  Diet effective now                    Inpatient Medications  Scheduled Meds:  aspirin EC  81 mg Oral Daily   carvedilol  3.125 mg Oral BID WC   hydroxyurea  1,000 mg Oral Daily   And   hydroxyurea  500 mg Oral QPM   [START ON 10/12/2022] levothyroxine  50 mcg Oral Once per day on Tuesday Thursday Saturday   And   [START ON 10/11/2022] levothyroxine  75 mcg Oral Once per day on Sunday Monday Wednesday Friday   Continuous Infusions:  cefTRIAXone (ROCEPHIN)  IV 2 g (10/09/22 2118)   lactated ringers     metronidazole 500 mg (10/09/22 2135)   PRN Meds:.acetaminophen **OR** acetaminophen, fentaNYL (SUBLIMAZE) injection, hydrALAZINE **OR** hydrALAZINE, morphine injection, naLOXone (NARCAN)  injection, ondansetron (ZOFRAN) IV, oxyCODONE  Antibiotics  :    Anti-infectives (From admission, onward)    Start     Dose/Rate Route Frequency Ordered Stop   10/09/22 0215  cefTRIAXone (ROCEPHIN) 2 g in sodium chloride 0.9 % 100 mL IVPB        2 g 200 mL/hr over 30 Minutes Intravenous Daily at bedtime 10/09/22 0207     10/09/22 0215  metroNIDAZOLE (FLAGYL) IVPB 500 mg        50 0 mg 100 mL/hr over 60 Minutes Intravenous 2 times daily 10/09/22 0207           Objective:   Vitals:   10/09/22 2100 10/10/22 0058 10/10/22 0436 10/10/22 0800  BP: 112/82 127/69 130/74 (!) 145/81  Pulse:  83 72 86  Resp: (!) 22 20 (!) 21 20  Temp: 99 F (37.2 C) 97.8 F (36.6 C) (!) 97 F (36.1 C) 97.8 F (36.6 C)  TempSrc: Oral Oral Axillary Oral  SpO2:  95% 97% 92%  Weight:   57.8 kg   Height:         Wt Readings from Last 3 Encounters:  10/10/22 57.8 kg  01/25/20 57.6 kg  01/18/19 57.6 kg     Intake/Output Summary (Last 24 hours) at 10/10/2022 1001 Last data filed at 10/09/2022 2000 Gross per 24 hour  Intake --  Output 20 ml  Net -20 ml     Physical Exam  Awake Alert, No new F.N deficits, Normal affect [place] Level.AT,PERRAL Supple Neck, No JVD,   Symmetrical Chest wall movement, Good air movement bilaterally, CTAB RRR,No Gallops,Rubs or new Murmurs,  +ve B.Sounds, Abd Soft, No tenderness, LUQ drain No Cyanosis, Clubbing or edema       Data Review:    Recent Labs  Lab 10/09/22 0336 10/10/22 0122  WBC 22.4* 18.0*  HGB 9.0* 9.0*  HCT 28.4* 27.7*  PLT 898* 917*  MCV 110.5* 114.9*  MCH 35.0* 37.3*  MCHC 31.7 32.5  RDW 26.5* 25.7*  LYMPHSABS 1.4 0.7  MONOABS 1.8* 2.2*  EOSABS 0.1 0.2  BASOSABS 0.3* 0.5*    Recent Labs  Lab 10/09/22 0336 10/09/22 1437 10/10/22 0122  NA 134* 130* 130*  K 2.8* 4.8 3.5  CL 95* 99 98  CO2 25 23 21*  ANIONGAP 14 8 11   GLUCOSE 85 153* 102*  BUN 8 6* 9  CREATININE 0.76 0.83 0.75  AST 28  --  33  ALT 21  --  21  ALKPHOS 166*  --  226*  BILITOT 0.7  --  0.5  ALBUMIN 3.0*  --  2.7*  CRP  --   --  15.8*  PROCALCITON  --   --  0.50  LATICACIDVEN 0.8  --   --   TSH 6.055*  --   --   BNP  --   --  107.0*  MG 2.1  --  1.9  CALCIUM 8.8* 8.1* 8.3*      Recent Labs  Lab 10/09/22 0336 10/09/22 1437 10/10/22 0122  CRP  --   --  15.8*  PROCALCITON  --   --  0.50  LATICACIDVEN 0.8  --   --   TSH 6.055*  --   --   BNP  --   --  107.0*  MG 2.1  --  1.9  CALCIUM 8.8* 8.1* 8.3*    Recent Labs    10/09/22 0336  TSH 6.055*   Micro Results Recent Results (from the past 240 hour(s))  Culture, blood (Routine X 2) w Reflex to ID Panel     Status: None (Preliminary result)   Collection Time: 10/09/22  3:36 AM   Specimen: BLOOD  Result Value Ref Range Status   Specimen Description BLOOD BLOOD RIGHT ARM  Final   Special  Requests   Final    BOTTLES DRAWN AEROBIC AND ANAEROBIC Blood Culture adequate volume   Culture   Final    NO GROWTH 1 DAY Performed at [facility], [address], [place], [place] [address]    Report Status PENDING  Incomplete  Culture, blood (Routine X 2) w Reflex to ID Panel     Status: None (Preliminary result)   Collection Time: 10/09/22  3:36 AM   Specimen: BLOOD  Result Value Ref Range Status   Specimen Description BLOOD BLOOD RIGHT ARM  Final   Special Requests   Final    BOTTLES DRAWN AEROBIC AND ANAEROBIC Blood Culture adequate volume   Culture   Final    NO GROWTH 1 DAY Performed at [facility], [address] [place], [place] [address]    Report Status PENDING  Incomplete  Aerobic/Anaerobic Culture w Gram Stain (surgical/deep wound)     Status: None (Preliminary result)   Collection Time: 10/09/22 11:11 AM   Specimen: Abscess  Result Value Ref Range Status   Specimen Description ABSCESS  Final   Special Requests ABDOMEN  Final   Gram Stain   Final    FEW WBC PRESENT, PREDOMINANTLY PMN NO ORGANISMS SEEN Performed at [facility], [address] [place], [place] [address]    Culture PENDING  Incomplete   Report Status PENDING  Incomplete    Radiology Reports CT GUIDED PERITONEAL/RETROPERITONEAL FLUID DRAIN BY PERC CATH  Result Date: 10/09/2022 INDICATION: 77 year old female with perisplenic fluid collection referred for aspiration/drainage EXAM: ULTRASOUND-GUIDED DRAINAGE OF PERISPLENIC FLUID TECHNIQUE: Multidetector CT imaging of the abdomen was performed following the standard protocol without IV contrast. RADIATION DOSE REDUCTION: This exam was performed according to the departmental dose-optimization program which includes automated exposure control, adjustment of the mA and/or kV according to patient size and/or use of iterative reconstruction technique. MEDICATIONS: None ANESTHESIA/SEDATION: Moderate (conscious) sedation was not employed  during this procedure. A total of Versed 1.0 mg and Fentanyl 0 mcg was administered intravenously by the radiology nurse. Total intra-service moderate Sedation Time: 0 minutes. The patient's level of consciousness and vital signs were monitored continuously by  radiology nursing throughout the procedure under my direct supervision. COMPLICATIONS: None PROCEDURE: Informed written consent was obtained from the patient after a thorough discussion of the procedural risks, benefits and alternatives. All questions were addressed. Maximal Sterile Barrier Technique was utilized including caps, mask, sterile gowns, sterile gloves, sterile drape, hand hygiene and skin antiseptic. A timeout was performed prior to the initiation of the procedure. Patient was positioned supine on the CT gantry table. Scout CT acquired for planning purposes. The patient was prepped and draped in the usual sterile fashion. 1% lidocaine was used for local anesthesia. Using CT guidance, Yueh needle was advanced into the dense fluid collection inferior to the spleen, corresponding to region of the lowest density on the preoperative contrast-enhanced CT. Once the Yueh needle was in position, modified Seldinger technique was used to place a 10 [place] drain. Very scant amount of dark maroon blood products aspirated. Sample was sent for culture. Final CT was acquired confirming adequate location of the drain. Drain was sutured in position and attached to bulb suction. Patient tolerated the procedure well and remained hemodynamically stable throughout. No complications were encountered and no significant blood loss. IMPRESSION: Status post CT-guided drainage of perisplenic fluid, aspirating small volume of dark blood coagulated blood products. Signed, Yvone Neu. Reyne Dumas, ABVM, RPVI [facility] Radiology PLAN: If the culture confirms sterile collection, would advise removal of the drain immediately.  Electronically Signed   By: Gilmer Mor D.O.   On: 10/09/2022 13:09      Signature  -   Susa Raring M.D on 10/10/2022 at 10:01 AM   -  To page go to [email or web address]

## 2022-10-10 NOTE — Progress Notes (Signed)
Referring Physician(s): Shah,Ashish  Supervising Physician: Gilmer Mor  Patient Status:  [facility] - In-pt  Chief Complaint: Perisplenic fluid collection s/p drain placement in IR 10/09/22  Subjective: Patient resting in bed watching TV. PT/OT in the room getting ready to work with the patient. Patient states she feels better today.   Allergies: Brimonidine, Codeine, Hydrocodone, and Other  Medications: Prior to Admission medications   Medication Sig Start Date End Date Taking? Authorizing Provider  brimonidine (ALPHAGAN) 0.2 % ophthalmic solution Place 1 drop into the right eye at bedtime.   Yes [provider]  cetirizine (ZYRTEC) 10 MG tablet Take 10 mg by mouth daily. 09/20/22  Yes [provider]  chlorthalidone (HYGROTON) 25 MG tablet Take 25 mg by mouth daily.   Yes [provider]  folic acid (FOLVITE) 1 MG tablet Take 1 mg by mouth daily.   Yes [provider]  hydroxyurea (HYDREA) 500 MG capsule Take 500 mg by mouth in the morning, at noon, and at bedtime.   Yes [provider]  latanoprost (XALATAN) 0.005 % ophthalmic solution Place 1 drop into the right eye at bedtime. 10/25/18  Yes [provider]  SYNTHROID 50 MCG tablet Take 50 mcg by mouth every other day. Alternating with 08/31/21  Yes [provider]  SYNTHROID 75 MCG tablet Take 75 mcg by mouth every other day. Alternating with   Yes [provider]     Vital Signs: BP (!) 145/81 (BP Location: Right Arm)   Pulse 86   Temp 97.8 F (36.6 C) (Oral)   Resp 20   Ht 5\' 6"  (1.676 m)   Wt 127 lb 6.8 oz (57.8 kg)   SpO2 92%   BMI 20.57 kg/m   Physical Exam Constitutional:      General: She is not in acute distress.    Appearance: She is ill-appearing.  Pulmonary:     Effort: Pulmonary effort is normal.  Abdominal:     Comments: LUQ drain to suction. Approximately 25 ml of bloody fluid in bulb. Dressing clean and dry. Site with  mild tenderness to palpation. Drain easily flushed with 5 ml NS.   Skin:    General: Skin is warm and dry.  Neurological:     Mental Status: She is alert and oriented to person, place, and time.     Imaging: CT GUIDED PERITONEAL/RETROPERITONEAL FLUID DRAIN BY PERC CATH  Result Date: 10/09/2022 INDICATION: 77 year old female with perisplenic fluid collection referred for aspiration/drainage EXAM: ULTRASOUND-GUIDED DRAINAGE OF PERISPLENIC FLUID TECHNIQUE: Multidetector CT imaging of the abdomen was performed following the standard protocol without IV contrast. RADIATION DOSE REDUCTION: This exam was performed according to the departmental dose-optimization program which includes automated exposure control, adjustment of the mA and/or kV according to patient size and/or use of iterative reconstruction technique. MEDICATIONS: None ANESTHESIA/SEDATION: Moderate (conscious) sedation was not employed during this procedure. A total of Versed 1.0 mg and Fentanyl 0 mcg was administered intravenously by the radiology nurse. Total intra-service moderate Sedation Time: 0 minutes. The patient's level of consciousness and vital signs were monitored continuously by radiology nursing throughout the procedure under my direct supervision. COMPLICATIONS: None PROCEDURE: Informed written consent was obtained from the patient after a thorough discussion of the procedural risks, benefits and alternatives. All questions were addressed. Maximal Sterile Barrier Technique was utilized including caps, mask, sterile gowns, sterile gloves, sterile drape, hand hygiene and skin antiseptic. A timeout was performed prior to the initiation of the procedure.  Patient was positioned supine on the CT gantry table. Scout CT acquired for planning purposes. The patient was prepped and draped in the usual sterile fashion. 1% lidocaine was used for local anesthesia. Using CT guidance, Yueh needle was advanced into the dense fluid collection  inferior to the spleen, corresponding to region of the lowest density on the preoperative contrast-enhanced CT. Once the Yueh needle was in position, modified Seldinger technique was used to place a 10 [place] drain. Very scant amount of dark maroon blood products aspirated. Sample was sent for culture. Final CT was acquired confirming adequate location of the drain. Drain was sutured in position and attached to bulb suction. Patient tolerated the procedure well and remained hemodynamically stable throughout. No complications were encountered and no significant blood loss. IMPRESSION: Status post CT-guided drainage of perisplenic fluid, aspirating small volume of dark blood coagulated blood products. Signed, Yvone Neu. Reyne Dumas, ABVM, RPVI [facility] Radiology PLAN: If the culture confirms sterile collection, would advise removal of the drain immediately. Electronically Signed   By: Gilmer Mor D.O.   On: 10/09/2022 13:09    Labs:  CBC: Recent Labs    10/09/22 0336 10/10/22 0122  WBC 22.4* 18.0*  HGB 9.0* 9.0*  HCT 28.4* 27.7*  PLT 898* 917*    COAGS: No results for input(s): "INR", "APTT" in the last 8760 hours.  BMP: Recent Labs    10/09/22 0336 10/09/22 1437 10/10/22 0122  NA 134* 130* 130*  K 2.8* 4.8 3.5  CL 95* 99 98  CO2 25 23 21*  GLUCOSE 85 153* 102*  BUN 8 6* 9  CALCIUM 8.8* 8.1* 8.3*  CREATININE 0.76 0.83 0.75  GFRNONAA >60 >60 >60    LIVER FUNCTION TESTS: Recent Labs    10/09/22 0336 10/10/22 0122  BILITOT 0.7 0.5  AST 28 33  ALT 21 21  ALKPHOS 166* 226*  PROT 6.6 6.0*  ALBUMIN 3.0* 2.7*    Assessment and Plan:   Perisplenic fluid collection s/p drain placement in IR 10/09/22  WBC decreasing. Patient is afebrile. 20 ml output documented in Epic with another 25 ml of bloody fluid in bulb. Cultures negative for growth so far. IR recommends immediate drain removal if cultures are negative.   Drain  Location: LUQ Size: Fr size: 10 Fr Date of placement: 10/09/22  Currently to: Drain collection device: suction bulb 24 hour output:  Output by Drain (mL) 10/08/22 0700 - 10/08/22 1459 10/08/22 1500 - 10/08/22 2259 10/08/22 2300 - 10/09/22 0659 10/09/22 0700 - 10/09/22 1459 10/09/22 1500 - 10/09/22 2259 10/09/22 2300 - 10/10/22 0659 10/10/22 0700 - 10/10/22 1024  Closed System Drain 1 Lateral;Left Abdomen Accordion (Hemovac) 10 Fr.     20      Interval imaging/drain manipulation:  none  Current examination: Flushes/aspirates easily.  Insertion site unremarkable. Suture and stat lock in place. Dressed appropriately.   Plan: Continue TID flushes with 5 cc NS. Record output Q shift. Dressing changes QD or PRN if soiled.  Call IR APP or on call IR MD if difficulty flushing or sudden change in drain output.  Repeat imaging/possible drain injection once output < 10 mL/QD (excluding flush material). Consideration for drain removal if output is < 10 mL/QD (excluding flush material), pending discussion with the providing surgical service.  Discharge planning: Please contact IR APP or on call IR MD prior to patient d/c to ensure appropriate follow up plans are in place. Typically patient will follow up with  IR clinic 10-14 days post d/c for repeat imaging/possible drain injection. IR scheduler will contact patient with date/time of appointment. Patient will need to flush drain QD with 5 cc NS, record output QD, dressing changes every 2-3 days or earlier if soiled.   IR will continue to follow - please call with questions or concerns.  Electronically Signed: Alwyn Ren, AGACNP-BC [phone] 10/10/2022, 9:02 AM   I spent a total of 15 Minutes at the the patient's bedside AND on the patient's hospital floor or unit, greater than 50% of which was counseling/coordinating care for perisplenic abscess drain.
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
## 2022-10-10 NOTE — Plan of Care (Signed)
Pt remains stable, VSS. No complaints of left side pain at JP drain site. Meds given as ordered (see MAR). See flowsheet for frequent vitals and detailed assessment. Will continue with plan of care.    Problem: Education: Goal: Knowledge of General Education information will improve Description: Including pain rating scale, medication(s)/side effects and non-pharmacologic comfort measures Outcome: Progressing   Problem: Health Behavior/Discharge Planning: Goal: Ability to manage health-related needs will improve Outcome: Progressing   Problem: Clinical Measurements: Goal: Ability to maintain clinical measurements within normal limits will improve Outcome: Progressing Goal: Will remain free from infection Outcome: Progressing Goal: Diagnostic test results will improve Outcome: Progressing Goal: Respiratory complications will improve Outcome: Progressing Goal: Cardiovascular complication will be avoided Outcome: Progressing   Problem: Activity: Goal: Risk for activity intolerance will decrease Outcome: Progressing   Problem: Nutrition: Goal: Adequate nutrition will be maintained Outcome: Progressing   Problem: Coping: Goal: Level of anxiety will decrease Outcome: Progressing   Problem: Elimination: Goal: Will not experience complications related to bowel motility Outcome: Progressing Goal: Will not experience complications related to urinary retention Outcome: Progressing   Problem: Pain Managment: Goal: General experience of comfort will improve Outcome: Progressing   Problem: Safety: Goal: Ability to remain free from injury will improve Outcome: Progressing   Problem: Skin Integrity: Goal: Risk for impaired skin integrity will decrease Outcome: Progressing

## 2022-10-10 NOTE — Progress Notes (Signed)
  Echocardiogram 2D Echocardiogram has been performed.  Delcie Roch 10/10/2022, 12:00 PM

## 2022-10-10 NOTE — Evaluation (Signed)
Physical Therapy Evaluation Patient Details Name: Gabrielle Donovan MRN: [id number] DOB: 08-14-1945 Today's Date: 10/10/2022  History of Present Illness  Gabrielle Donovan is a 77 y.o. female  who is admitted to [facility] on 10/08/2022 by way of transfer from [facility] ED with concern for perisplenic abscess after presenting with abdominal pain.  Clinical Impression   Pt admitted with above diagnosis. Lives at home alone, in a single-level home with 3 steps to enter; Prior to admission, pt was able to manage independently in home, uses a rollator for longer distances; Presents to PT with L sided pain and discomfort, difficulty walking, decr activity tolerance;  needing min assist for getting up to EOB, standing, and walking in hallways with bil UE support from RW; Pt currently with functional limitations due to the deficits listed below (see PT Problem List). Pt will benefit from skilled PT to increase their independence and safety with mobility to allow discharge to the venue listed below.           Assistance Recommended at Discharge Set up Supervision/Assistance  If plan is discharge home, recommend the following:  Can travel by private vehicle  Assistance with cooking/housework        Equipment Recommendations None recommended by PT (has a rollator)  Recommendations for Other Services       Functional Status Assessment Patient has had a recent decline in their functional status and demonstrates the ability to make significant improvements in function in a reasonable and predictable amount of time.     Precautions / Restrictions Precautions Precautions: Other (comment) Precaution Comments: JP drain      Mobility  Bed Mobility Overal bed mobility: Modified Independent                  Transfers Overall transfer level: Needs assistance Equipment used: None Transfers: Sit to/from Stand Sit to Stand: Min guard           General transfer comment: intially  pressing legs into side of bed for external support in standing. Fatigue.    Ambulation/Gait Ambulation/Gait assistance: Min guard Gait Distance (Feet): 80 Feet Assistive device: Rolling walker (2 wheels) Gait Pattern/deviations: Step-through pattern, Trunk flexed       General Gait Details: Tending to walk with trunk flexed, but able to extend to fully upright standing with cues; Cues to self-monitor for activity tolerance   Stairs            Wheelchair Mobility     Tilt Bed    Modified Rankin (Stroke Patients Only)       Balance Overall balance assessment: Needs assistance Sitting-balance support: No upper extremity supported, Feet supported Sitting balance-Leahy Scale: Good     Standing balance support: No upper extremity supported, During functional activity Standing balance-Leahy Scale: Fair Standing balance comment: initially pressing backs of legs into bed for external support in static standing.                             Pertinent Vitals/Pain Pain Assessment Pain Assessment: 0-10 Pain Score: 6  Faces Pain Scale: Hurts even more Pain Location: site of abdominal drain Pain Descriptors / Indicators: Sore Pain Intervention(s): Patient requesting pain meds-RN notified    Home Living Family/patient expects to be discharged to:: Private residence Living Arrangements: Alone Available Help at Discharge: Family Type of Home: Mobile home Home Access: Stairs to enter   Entrance Stairs-Number of Steps: 3  Home Layout: One level Home Equipment: Agricultural consultant (2 wheels);Rollator (4 wheels);Gilmer Mor - single point Additional Comments: Daughter lives in [place], coming into town today. Pt is a retired Lawyer.    Prior Function Prior Level of Function : Independent/Modified Independent             Mobility Comments: rollator for longer distances such as recent beach trip. otherwise no AD used at baseline       Hand Dominance         Extremity/Trunk Assessment   Upper Extremity Assessment Upper Extremity Assessment: Defer to OT evaluation    Lower Extremity Assessment Lower Extremity Assessment: Generalized weakness    Cervical / Trunk Assessment Cervical / Trunk Assessment: Other exceptions Cervical / Trunk Exceptions: Tending to keep trunk flexed while ambulating  Communication   Communication: No difficulties  Cognition Arousal/Alertness: Awake/alert Behavior During Therapy: WFL for tasks assessed/performed Overall Cognitive Status: Within Functional Limits for tasks assessed                                          General Comments General comments (skin integrity, edema, etc.): HR got up to 120 bpm with amb    Exercises     Assessment/Plan    PT Assessment Patient needs continued PT services  PT Problem List Pain;Decreased strength;Decreased range of motion;Decreased activity tolerance;Decreased balance;Decreased mobility;Decreased knowledge of use of DME;Decreased knowledge of precautions;Cardiopulmonary status limiting activity       PT Treatment Interventions DME instruction;Gait training;Stair training;Functional mobility training;Therapeutic activities;Therapeutic exercise;Balance training;Cognitive remediation;Patient/family education;Neuromuscular re-education    PT Goals (Current goals can be found in the Care Plan section)  Acute Rehab PT Goals Patient Stated Goal: Did not specifically state, but agreeable to amb PT Goal Formulation: With patient Time For Goal Achievement: 10/24/22 Potential to Achieve Goals: Good    Frequency Min 1X/week     Co-evaluation               AM-PAC PT "6 Clicks" Mobility  Outcome Measure Help needed turning from your back to your side while in a flat bed without using bedrails?: None Help needed moving from lying on your back to sitting on the side of a flat bed without using bedrails?: A Little Help needed moving to and from a  bed to a chair (including a wheelchair)?: A Little Help needed standing up from a chair using your arms (e.g., wheelchair or bedside chair)?: A Little Help needed to walk in hospital room?: A Little Help needed climbing 3-5 steps with a railing? : A Little 6 Click Score: 19    End of Session   Activity Tolerance: Patient tolerated treatment well Patient left: in bed;with call bell/phone within reach Nurse Communication: Mobility status;Patient requests pain meds PT Visit Diagnosis: Unsteadiness on feet (R26.81);Other abnormalities of gait and mobility (R26.89)    Time: 1478-2956 PT Time Calculation (min) (ACUTE ONLY): 14 min   Charges:   PT Evaluation $PT Eval Low Complexity: 1 Low   PT General Charges $$ ACUTE PT VISIT: 1 Visit         Van Clines, PT  Acute Rehabilitation Services Office [phone] Secure Chat welcomed   Levi Aland 10/10/2022, 5:06 PM

## 2022-10-11 DIAGNOSIS — D733 Abscess of spleen: Secondary | ICD-10-CM | POA: Diagnosis not present

## 2022-10-11 NOTE — Plan of Care (Signed)

## 2022-10-11 NOTE — Progress Notes (Signed)
PROGRESS NOTE                                                                                                                                                                                                             Patient Demographics:    Gabrielle Donovan, is a 77 y.o. female, DOB - 02/04/1946, GNF:[id number]  Outpatient Primary MD for the patient is Pcp, No    LOS - 3  Admit date - 10/08/2022    No chief complaint on file.      Brief Narrative (HPI from H&P)  -  77 y.o. female with medical history significant for essential pretension, acquired hypothyroidism, polycythemia vera, who is admitted to [facility] on 10/08/2022 by way of transfer from [facility] department with concern for perisplenic abscess after presenting from home to [facility] with left upper quadrant abdominal pain for about 2 weeks in duration, workup suggestive of possible perisplenic abscess she was subsequently transferred to [facility].   Subjective:   Patient in bed, appears comfortable, denies any headache, no fever, no chest pain or pressure, no shortness of breath , no abdominal pain. No new focal weakness.    Assessment  & Plan :    Sepsis due to perisplenic abscess.  Patient had been sick for about 2 weeks prior to this, she was seen by IR underwent left upper quadrant CT-guided drainage with tube placement on 10/09/2022, currently on Rocephin and Flagyl, sepsis pathophysiology is improving, cause still not clear, requested ID to evaluate the patient.  Follow final cultures.   Dehydration with hyponatremia.  Hydrate with IV fluids.  Polycythemia vera: Documented history of such, on anagrelide.   Essential hypertension.  Placed on Coreg.  Hypothyroidism.  On Synthroid.  TSH stable.  3.5 cm simple appearing cystic lesion in the retroperitoneum, likely benign in etiology. Per PCP.       Condition - Extremely  Guarded  Family Communication  :  None  Code Status :  Full  Consults  :  IR, ID  PUD Prophylaxis :    Procedures  :     CT-guided spleen drain placement by IR on 10/09/2022.      Disposition Plan  :    Status is: Inpatient  DVT Prophylaxis  :    heparin injection 5,000 Units Start: 10/10/22  1400 Place and maintain sequential compression device Start: 10/10/22 1012 SCDs Start: 10/09/22 0155    Lab Results  Component Value Date   PLT 933 (HH) 10/11/2022    Diet :  Diet Order             Diet regular Room service appropriate? Yes; Fluid consistency: Thin  Diet effective now                    Inpatient Medications  Scheduled Meds:  aspirin EC  81 mg Oral Daily   brimonidine  1 drop Right Eye TID   carvedilol  3.125 mg Oral BID WC   heparin injection (subcutaneous)  5,000 Units Subcutaneous Q8H   hydroxyurea  1,000 mg Oral Daily   And   hydroxyurea  500 mg Oral QPM   [START ON 10/12/2022] levothyroxine  50 mcg Oral Once per day on Tuesday Thursday Saturday   And   levothyroxine  75 mcg Oral Once per day on Sunday Monday Wednesday Friday   loratadine  10 mg Oral Daily   sodium chloride flush  5 mL Intracatheter Q8H   Continuous Infusions:  cefTRIAXone (ROCEPHIN)  IV Stopped (10/11/22 0002)   metronidazole Stopped (10/10/22 2300)   PRN Meds:.acetaminophen **OR** acetaminophen, fentaNYL (SUBLIMAZE) injection, hydrALAZINE, morphine injection, naLOXone (NARCAN)  injection, ondansetron (ZOFRAN) IV, oxyCODONE  Antibiotics  :    Anti-infectives (From admission, onward)    Start     Dose/Rate Route Frequency Ordered Stop   10/09/22 0215  cefTRIAXone (ROCEPHIN) 2 g in sodium chloride 0.9 % 100 mL IVPB        2 g 200 mL/hr over 30 Minutes Intravenous Daily at bedtime 10/09/22 0207     10/09/22 0215  metroNIDAZOLE (FLAGYL) IVPB 500 mg        50 0 mg 100 mL/hr over 60 Minutes Intravenous 2 times daily 10/09/22 0207           Objective:   Vitals:    10/10/22 1600 10/10/22 2000 10/10/22 2320 10/11/22 0807  BP:  132/78 (!) 141/64   Pulse:  100 90   Resp:  20 (!) 22   Temp: 98 F (36.7 C) 98.1 F (36.7 C) 98 F (36.7 C)   TempSrc: Oral Oral Oral   SpO2: 100%     Weight:    59.2 kg  Height:        Wt Readings from Last 3 Encounters:  10/11/22 59.2 kg  01/25/20 57.6 kg  01/18/19 57.6 kg     Intake/Output Summary (Last 24 hours) at 10/11/2022 0907 Last data filed at 10/11/2022 0757 Gross per 24 hour  Intake 500 ml  Output 30 ml  Net 470 ml     Physical Exam  Awake Alert, No new F.N deficits, Normal affect [place].AT,PERRAL Supple Neck, No JVD,   Symmetrical Chest wall movement, Good air movement bilaterally, CTAB RRR,No Gallops,Rubs or new Murmurs,  +ve B.Sounds, Abd Soft, No tenderness, LUQ drain No Cyanosis, Clubbing or edema       Data Review:    Recent Labs  Lab 10/09/22 0336 10/10/22 0122 10/11/22 0644  WBC 22.4* 18.0* PENDING  HGB 9.0* 9.0* PENDING  HCT 28.4* 27.7* PENDING  PLT 898* 917* 933*  MCV 110.5* 114.9* PENDING  MCH 35.0* 37.3* PENDING  MCHC 31.7 32.5 PENDING  RDW 26.5* 25.7* PENDING  LYMPHSABS 1.4 0.7 PENDING  MONOABS 1.8* 2.2* PENDING  EOSABS 0.1 0.2 PENDING  BASOSABS 0.3* 0.5* PENDING    Recent  Labs  Lab 10/09/22 0336 10/09/22 1437 10/10/22 0122 10/11/22 0644  NA 134* 130* 130* 132*  K 2.8* 4.8 3.5 3.8  CL 95* 99 98 98  CO2 25 23 21* 21*  ANIONGAP 14 8 11 13   GLUCOSE 85 153* 102* 89  BUN 8 6* 9 7*  CREATININE 0.76 0.83 0.75 0.75  AST 28  --  33 32  ALT 21  --  21 19  ALKPHOS 166*  --  226* 203*  BILITOT 0.7  --  0.5 0.5  ALBUMIN 3.0*  --  2.7* 2.9*  CRP  --   --  15.8* 14.8*  PROCALCITON  --   --  0.50  --   LATICACIDVEN 0.8  --   --   --   TSH 6.055*  --   --   --   BNP  --   --  107.0* 119.3*  MG 2.1  --  1.9 1.8  CALCIUM 8.8* 8.1* 8.3* 8.7*      Recent Labs  Lab 10/09/22 0336 10/09/22 1437 10/10/22 0122 10/11/22 0644  CRP  --   --  15.8* 14.8*  PROCALCITON   --   --  0.50  --   LATICACIDVEN 0.8  --   --   --   TSH 6.055*  --   --   --   BNP  --   --  107.0* 119.3*  MG 2.1  --  1.9 1.8  CALCIUM 8.8* 8.1* 8.3* 8.7*    Recent Labs    10/09/22 0336  TSH 6.055*   Micro Results Recent Results (from the past 240 hour(s))  Culture, blood (Routine X 2) w Reflex to ID Panel     Status: None (Preliminary result)   Collection Time: 10/09/22  3:36 AM   Specimen: BLOOD  Result Value Ref Range Status   Specimen Description BLOOD BLOOD RIGHT ARM  Final   Special Requests   Final    BOTTLES DRAWN AEROBIC AND ANAEROBIC Blood Culture adequate volume   Culture   Final    NO GROWTH 1 DAY Performed at [facility], [address], [place], [place] [address]    Report Status PENDING  Incomplete  Culture, blood (Routine X 2) w Reflex to ID Panel     Status: None (Preliminary result)   Collection Time: 10/09/22  3:36 AM   Specimen: BLOOD  Result Value Ref Range Status   Specimen Description BLOOD BLOOD RIGHT ARM  Final   Special Requests   Final    BOTTLES DRAWN AEROBIC AND ANAEROBIC Blood Culture adequate volume   Culture   Final    NO GROWTH 1 DAY Performed at [facility], [address] [place], [place] [address]    Report Status PENDING  Incomplete  Aerobic/Anaerobic Culture w Gram Stain (surgical/deep wound)     Status: None (Preliminary result)   Collection Time: 10/09/22 11:11 AM   Specimen: Abscess  Result Value Ref Range Status   Specimen Description ABSCESS  Final   Special Requests ABDOMEN  Final   Gram Stain   Final    FEW WBC PRESENT, PREDOMINANTLY PMN NO ORGANISMS SEEN    Culture   Final    NO GROWTH 1 DAY Performed at [facility], [address] [place], [place] [address]    Report Status PENDING  Incomplete    Radiology Reports ECHOCARDIOGRAM COMPLETE  Result Date: 10/10/2022    ECHOCARDIOGRAM REPORT   Patient  Name:   Vestal Noreene Larsson Date of Exam: 10/10/2022 Medical Rec #:  [id number]     Height:        66.0 in Accession #:    [id number]    Weight:       127.4 lb Date of Birth:  Jun 08, 1945     BSA:          1.651 m Patient Age:    65 years      BP:           145/81 mmHg Patient Gender: F             HR:           88 bpm. Exam Location:  Inpatient Procedure: 2D Echo, Color Doppler and Cardiac Doppler Indications:    acute diastolic chf  History:        Patient has no prior history of Echocardiogram examinations.                 Sepsis; Risk Factors:Hypertension.  Sonographer:    Delcie Roch RDCS Referring Phys: Effie Shy [facility] IMPRESSIONS  1. Left ventricular ejection fraction, by estimation, is 60 to 65%. The left ventricle has normal function. The left ventricle has no regional wall motion abnormalities. Left ventricular diastolic parameters are consistent with Grade I diastolic dysfunction (impaired relaxation).  2. Right ventricular systolic function is normal. The right ventricular size is normal.  3. The mitral valve is normal in structure. Mild mitral valve regurgitation. No evidence of mitral stenosis.  4. The aortic valve is normal in structure. Aortic valve regurgitation is not visualized. No aortic stenosis is present.  5. Pulmonic valve regurgitation is moderate.  6. The inferior vena cava is normal in size with greater than 50% respiratory variability, suggesting right atrial pressure of 3 mmHg. FINDINGS  Left Ventricle: Left ventricular ejection fraction, by estimation, is 60 to 65%. The left ventricle has normal function. The left ventricle has no regional wall motion abnormalities. The left ventricular internal cavity size was normal in size. There is  no left ventricular hypertrophy. Left ventricular diastolic parameters are consistent with Grade I diastolic dysfunction (impaired relaxation). Right Ventricle: The right ventricular size is normal. No increase in right ventricular wall thickness. Right ventricular systolic function is normal. Left Atrium: Left atrial size was normal in  size. Right Atrium: Right atrial size was normal in size. Pericardium: There is no evidence of pericardial effusion. Mitral Valve: The mitral valve is normal in structure. Mild mitral valve regurgitation. No evidence of mitral valve stenosis. Tricuspid Valve: The tricuspid valve is normal in structure. Tricuspid valve regurgitation is trivial. No evidence of tricuspid stenosis. Aortic Valve: The aortic valve is normal in structure. Aortic valve regurgitation is not visualized. No aortic stenosis is present. Pulmonic Valve: The pulmonic valve was normal in structure. Pulmonic valve regurgitation is moderate. No evidence of pulmonic stenosis. Aorta: The aortic root is normal in size and structure. Venous: The inferior vena cava is normal in size with greater than 50% respiratory variability, suggesting right atrial pressure of 3 mmHg. IAS/Shunts: No atrial level shunt detected by color flow Doppler.  LEFT VENTRICLE PLAX 2D LVIDd:         4.30 cm   Diastology LVIDs:         2.90 cm   LV e' medial:  8.59 cm/s LV PW:         0.80 cm   LV e' lateral: 8.49 cm/s LV IVS:  0.90 cm LVOT diam:     1.90 cm LV SV:         54 LV SV Index:   33 LVOT Area:     2.84 cm  RIGHT VENTRICLE            IVC RV Basal diam:  2.80 cm    IVC diam: 2.10 cm RV S prime:     9.68 cm/s TAPSE (M-mode): 2.2 cm LEFT ATRIUM             Index        RIGHT ATRIUM           Index LA diam:        3.20 cm 1.94 cm/m   RA Area:     16.60 cm LA Vol (A2C):   52.9 ml 32.04 ml/m  RA Volume:   44.50 ml  26.95 ml/m LA Vol (A4C):   35.6 ml 21.56 ml/m LA Biplane Vol: 43.7 ml 26.47 ml/m  AORTIC VALVE LVOT Vmax:   95.00 cm/s LVOT Vmean:  64.000 cm/s LVOT VTI:    0.191 m  AORTA Ao Root diam: 2.50 cm Ao Asc diam:  2.50 cm TRICUSPID VALVE TV Peak grad:   28.1 mmHg TV Vmax:        2.65 m/s TR Peak grad:   16.5 mmHg TR Vmax:        203.00 cm/s  SHUNTS Systemic VTI:  0.19 m Systemic Diam: 1.90 cm Donato Schultz MD Electronically signed by Donato Schultz MD Signature  Date/Time: 10/10/2022/3:11:39 PM    Final    CT GUIDED PERITONEAL/RETROPERITONEAL FLUID DRAIN BY PERC CATH  Result Date: 10/09/2022 INDICATION: 77 year old female with perisplenic fluid collection referred for aspiration/drainage EXAM: ULTRASOUND-GUIDED DRAINAGE OF PERISPLENIC FLUID TECHNIQUE: Multidetector CT imaging of the abdomen was performed following the standard protocol without IV contrast. RADIATION DOSE REDUCTION: This exam was performed according to the departmental dose-optimization program which includes automated exposure control, adjustment of the mA and/or kV according to patient size and/or use of iterative reconstruction technique. MEDICATIONS: None ANESTHESIA/SEDATION: Moderate (conscious) sedation was not employed during this procedure. A total of Versed 1.0 mg and Fentanyl 0 mcg was administered intravenously by the radiology nurse. Total intra-service moderate Sedation Time: 0 minutes. The patient's level of consciousness and vital signs were monitored continuously by radiology nursing throughout the procedure under my direct supervision. COMPLICATIONS: None PROCEDURE: Informed written consent was obtained from the patient after a thorough discussion of the procedural risks, benefits and alternatives. All questions were addressed. Maximal Sterile Barrier Technique was utilized including caps, mask, sterile gowns, sterile gloves, sterile drape, hand hygiene and skin antiseptic. A timeout was performed prior to the initiation of the procedure. Patient was positioned supine on the CT gantry table. Scout CT acquired for planning purposes. The patient was prepped and draped in the usual sterile fashion. 1% lidocaine was used for local anesthesia. Using CT guidance, Yueh needle was advanced into the dense fluid collection inferior to the spleen, corresponding to region of the lowest density on the preoperative contrast-enhanced CT. Once the Yueh needle was in position, modified Seldinger technique  was used to place a 10 [place] drain. Very scant amount of dark maroon blood products aspirated. Sample was sent for culture. Final CT was acquired confirming adequate location of the drain. Drain was sutured in position and attached to bulb suction. Patient tolerated the procedure well and remained hemodynamically stable throughout. No complications were encountered and no significant blood loss. IMPRESSION: Status  post CT-guided drainage of perisplenic fluid, aspirating small volume of dark blood coagulated blood products. Signed, Yvone Neu. Reyne Dumas, ABVM, RPVI [facility] Radiology PLAN: If the culture confirms sterile collection, would advise removal of the drain immediately. Electronically Signed   By: Gilmer Mor D.O.   On: 10/09/2022 13:09      Signature  -   Susa Raring M.D on 10/11/2022 at 9:07 AM   -  To page go to [email or web address]

## 2022-10-11 NOTE — Care Management Important Message (Signed)
Important Message  Patient Details  Name: Gabrielle Donovan MRN: [id number] Date of Birth: [birth year]   Medicare Important Message Given:  Yes     Dorena Bodo 10/11/2022, 4:21 PM

## 2022-10-11 NOTE — Progress Notes (Signed)
Referring Physician(s): Shah,Ashish  Supervising Physician: Oley Balm  Patient Status:  [facility] - In-pt  Chief Complaint: Follow up perisplenic fluid collection s/p drain placement in IR 10/09/22  Subjective:  Patient laying in bed, states she's sleepy from pain medications but feels well otherwise. Hopeful to be able to go home tomorrow.   Allergies: Codeine, Hydrocodone, and Other  Medications: Prior to Admission medications   Medication Sig Start Date End Date Taking? Authorizing Provider  brimonidine (ALPHAGAN) 0.2 % ophthalmic solution Place 1 drop into the right eye at bedtime.   Yes [provider]  cetirizine (ZYRTEC) 10 MG tablet Take 10 mg by mouth daily. 09/20/22  Yes [provider]  chlorthalidone (HYGROTON) 25 MG tablet Take 25 mg by mouth daily.   Yes [provider]  folic acid (FOLVITE) 1 MG tablet Take 1 mg by mouth daily.   Yes [provider]  hydroxyurea (HYDREA) 500 MG capsule Take 500 mg by mouth in the morning, at noon, and at bedtime.   Yes [provider]  latanoprost (XALATAN) 0.005 % ophthalmic solution Place 1 drop into the right eye at bedtime. 10/25/18  Yes [provider]  SYNTHROID 50 MCG tablet Take 50 mcg by mouth every other day. Alternating with 08/31/21  Yes [provider]  SYNTHROID 75 MCG tablet Take 75 mcg by mouth every other day. Alternating with   Yes [provider]     Vital Signs: BP 129/85   Pulse 86   Temp 98.4 F (36.9 C) (Oral)   Resp 18   Ht 5\' 6"  (1.676 m)   Wt 130 lb 8.2 oz (59.2 kg)   SpO2 98%   BMI 21.07 kg/m   Physical Exam Vitals reviewed.  Constitutional:      General: She is not in acute distress. HENT:     Head: Normocephalic.  Cardiovascular:     Rate and Rhythm: Normal rate.  Pulmonary:     Effort: Pulmonary effort is normal.  Abdominal:     Palpations: Abdomen is soft.     Comments: (+) LUQ drain to suction bulb  which was not charged on my exam today. Approximately 10 cc bloody output. Insertion site unremarkable.  Skin:    General: Skin is warm and dry.  Neurological:     Mental Status: She is alert. Mental status is at baseline.     Imaging: ECHOCARDIOGRAM COMPLETE  Result Date: 10/10/2022    ECHOCARDIOGRAM REPORT   Patient Name:   Gabrielle Donovan Date of Exam: 10/10/2022 Medical Rec #:  [id number]     Height:       66.0 in Accession #:    [id number]    Weight:       127.4 lb Date of Birth:  [birth year]     BSA:          1.651 m Patient Age:    77 years      BP:           145/81 mmHg Patient Gender: F             HR:           88 bpm. Exam Location:  Inpatient Procedure: 2D Echo, Color Doppler and Cardiac Doppler Indications:    acute diastolic chf  History:        Patient has no prior history of Echocardiogram examinations.  Sepsis; Risk Factors:Hypertension.  Sonographer:    Delcie Roch RDCS Referring Phys: Effie Shy [facility] IMPRESSIONS  1. Left ventricular ejection fraction, by estimation, is 60 to 65%. The left ventricle has normal function. The left ventricle has no regional wall motion abnormalities. Left ventricular diastolic parameters are consistent with Grade I diastolic dysfunction (impaired relaxation).  2. Right ventricular systolic function is normal. The right ventricular size is normal.  3. The mitral valve is normal in structure. Mild mitral valve regurgitation. No evidence of mitral stenosis.  4. The aortic valve is normal in structure. Aortic valve regurgitation is not visualized. No aortic stenosis is present.  5. Pulmonic valve regurgitation is moderate.  6. The inferior vena cava is normal in size with greater than 50% respiratory variability, suggesting right atrial pressure of 3 mmHg. FINDINGS  Left Ventricle: Left ventricular ejection fraction, by estimation, is 60 to 65%. The left ventricle has normal function. The left ventricle has no regional wall motion  abnormalities. The left ventricular internal cavity size was normal in size. There is  no left ventricular hypertrophy. Left ventricular diastolic parameters are consistent with Grade I diastolic dysfunction (impaired relaxation). Right Ventricle: The right ventricular size is normal. No increase in right ventricular wall thickness. Right ventricular systolic function is normal. Left Atrium: Left atrial size was normal in size. Right Atrium: Right atrial size was normal in size. Pericardium: There is no evidence of pericardial effusion. Mitral Valve: The mitral valve is normal in structure. Mild mitral valve regurgitation. No evidence of mitral valve stenosis. Tricuspid Valve: The tricuspid valve is normal in structure. Tricuspid valve regurgitation is trivial. No evidence of tricuspid stenosis. Aortic Valve: The aortic valve is normal in structure. Aortic valve regurgitation is not visualized. No aortic stenosis is present. Pulmonic Valve: The pulmonic valve was normal in structure. Pulmonic valve regurgitation is moderate. No evidence of pulmonic stenosis. Aorta: The aortic root is normal in size and structure. Venous: The inferior vena cava is normal in size with greater than 50% respiratory variability, suggesting right atrial pressure of 3 mmHg. IAS/Shunts: No atrial level shunt detected by color flow Doppler.  LEFT VENTRICLE PLAX 2D LVIDd:         4.30 cm   Diastology LVIDs:         2.90 cm   LV e' medial:  8.59 cm/s LV PW:         0.80 cm   LV e' lateral: 8.49 cm/s LV IVS:        0.90 cm LVOT diam:     1.90 cm LV SV:         54 LV SV Index:   33 LVOT Area:     2.84 cm  RIGHT VENTRICLE            IVC RV Basal diam:  2.80 cm    IVC diam: 2.10 cm RV S prime:     9.68 cm/s TAPSE (M-mode): 2.2 cm LEFT ATRIUM             Index        RIGHT ATRIUM           Index LA diam:        3.20 cm 1.94 cm/m   RA Area:     16.60 cm LA Vol (A2C):   52.9 ml 32.04 ml/m  RA Volume:   44.50 ml  26.95 ml/m LA Vol (A4C):   35.6  ml 21.56 ml/m LA Biplane Vol: 43.7 ml  26.47 ml/m  AORTIC VALVE LVOT Vmax:   95.00 cm/s LVOT Vmean:  64.000 cm/s LVOT VTI:    0.191 m  AORTA Ao Root diam: 2.50 cm Ao Asc diam:  2.50 cm TRICUSPID VALVE TV Peak grad:   28.1 mmHg TV Vmax:        2.65 m/s TR Peak grad:   16.5 mmHg TR Vmax:        203.00 cm/s  SHUNTS Systemic VTI:  0.19 m Systemic Diam: 1.90 cm Donato Schultz MD Electronically signed by Donato Schultz MD Signature Date/Time: 10/10/2022/3:11:39 PM    Final    CT GUIDED PERITONEAL/RETROPERITONEAL FLUID DRAIN BY PERC CATH  Result Date: 10/09/2022 INDICATION: 77 year old female with perisplenic fluid collection referred for aspiration/drainage EXAM: ULTRASOUND-GUIDED DRAINAGE OF PERISPLENIC FLUID TECHNIQUE: Multidetector CT imaging of the abdomen was performed following the standard protocol without IV contrast. RADIATION DOSE REDUCTION: This exam was performed according to the departmental dose-optimization program which includes automated exposure control, adjustment of the mA and/or kV according to patient size and/or use of iterative reconstruction technique. MEDICATIONS: None ANESTHESIA/SEDATION: Moderate (conscious) sedation was not employed during this procedure. A total of Versed 1.0 mg and Fentanyl 0 mcg was administered intravenously by the radiology nurse. Total intra-service moderate Sedation Time: 0 minutes. The patient's level of consciousness and vital signs were monitored continuously by radiology nursing throughout the procedure under my direct supervision. COMPLICATIONS: None PROCEDURE: Informed written consent was obtained from the patient after a thorough discussion of the procedural risks, benefits and alternatives. All questions were addressed. Maximal Sterile Barrier Technique was utilized including caps, mask, sterile gowns, sterile gloves, sterile drape, hand hygiene and skin antiseptic. A timeout was performed prior to the initiation of the procedure. Patient was positioned supine on  the CT gantry table. Scout CT acquired for planning purposes. The patient was prepped and draped in the usual sterile fashion. 1% lidocaine was used for local anesthesia. Using CT guidance, Yueh needle was advanced into the dense fluid collection inferior to the spleen, corresponding to region of the lowest density on the preoperative contrast-enhanced CT. Once the Yueh needle was in position, modified Seldinger technique was used to place a 10 [place] drain. Very scant amount of dark maroon blood products aspirated. Sample was sent for culture. Final CT was acquired confirming adequate location of the drain. Drain was sutured in position and attached to bulb suction. Patient tolerated the procedure well and remained hemodynamically stable throughout. No complications were encountered and no significant blood loss. IMPRESSION: Status post CT-guided drainage of perisplenic fluid, aspirating small volume of dark blood coagulated blood products. Signed, Yvone Neu. Reyne Dumas, ABVM, RPVI [facility] Radiology PLAN: If the culture confirms sterile collection, would advise removal of the drain immediately. Electronically Signed   By: Gilmer Mor D.O.   On: 10/09/2022 13:09    Labs:  CBC: Recent Labs    10/09/22 0336 10/10/22 0122 10/11/22 0644  WBC 22.4* 18.0* 14.9*  HGB 9.0* 9.0* 9.4*  HCT 28.4* 27.7* 28.9*  PLT 898* 917* 933*    COAGS: No results for input(s): "INR", "APTT" in the last 8760 hours.  BMP: Recent Labs    10/09/22 0336 10/09/22 1437 10/10/22 0122 10/11/22 0644  NA 134* 130* 130* 132*  K 2.8* 4.8 3.5 3.8  CL 95* 99 98 98  CO2 25 23 21* 21*  GLUCOSE 85 153* 102* 89  BUN 8 6* 9 7*  CALCIUM 8.8* 8.1* 8.3* 8.7*  CREATININE  0.76 0.83 0.75 0.75  GFRNONAA >60 >60 >60 >60    LIVER FUNCTION TESTS: Recent Labs    10/09/22 0336 10/10/22 0122 10/11/22 0644  BILITOT 0.7 0.5 0.5  AST 28 33 32  ALT 21 21 19   ALKPHOS 166* 226* 203*   PROT 6.6 6.0* 6.4*  ALBUMIN 3.0* 2.7* 2.9*    Assessment and Plan:  77 y/o F s/p perisplenic fluid collection drain placement 10/09/22 seen today for follow up.   Afebrile, WBC improving - 14.9 today, culture of aspirate NGTD so far.  Drain Location: LUQ Size: Fr size: 10 Fr Date of placement: 10/09/22  Currently to: Drain collection device: suction bulb 24 hour output:  Output by Drain (mL) 10/09/22 0701 - 10/09/22 1900 10/09/22 1901 - 10/10/22 0700 10/10/22 0701 - 10/10/22 1900 10/10/22 1901 - 10/11/22 0700 10/11/22 0701 - 10/11/22 1507  Closed System Drain 1 Lateral;Left Abdomen Accordion (Hemovac) 10 Fr.  20 15 15      Interval imaging/drain manipulation:  None  Current examination: Insertion site unremarkable. Suture and stat lock in place. Dressed appropriately.   Plan: Continue TID flushes with 5 cc NS. Record output Q shift. Dressing changes QD or PRN if soiled.  If culture remains NGTD when finalized plan to remove drain. No need for repeat imaging per IR attending.  Discharge planning: Please contact IR APP or on call IR MD prior to patient d/c to ensure appropriate follow up plans are in place. Typically patient will follow up with IR clinic 10-14 days post d/c for repeat imaging/possible drain injection. IR scheduler will contact patient with date/time of appointment. Patient will need to flush drain QD with 5 cc NS, record output QD, dressing changes every 2-3 days or earlier if soiled.   IR will continue to follow - please call with questions or concerns.  Electronically Signed: Villa Herb, PA-C 10/11/2022, 2:35 PM   I spent a total of 15 Minutes at the the patient's bedside AND on the patient's hospital floor or unit, greater than 50% of which was counseling/coordinating care for perisplenic fluid collection.

## 2022-10-11 NOTE — Care Management Important Message (Signed)
Important Message  Patient Details  Name: Gabrielle Donovan MRN: [id number] Date of Birth: Jul 29, 1945   Medicare Important Message Given:  Yes     Dorena Bodo 10/11/2022, 4:24 PM

## 2022-10-11 NOTE — TOC CM/SW Note (Signed)
Transition of Care [facility]) - Inpatient Brief Assessment   Patient Details  Name: Gabrielle Donovan MRN: [id number] Date of Birth: 1945/04/24  Transition of [facility] CM/SW Contact:    Gordy Clement, RN Phone Number: 10/11/2022, 4:05 PM   Clinical Narrative:  Patient from home alone. Patient states she is very independent and can do everything for herself. Home Health PT  has been recommended.  Patient will be dc'ing with a JP drain.. Daughter was on phone (speaker) and feels confident that she can assist with drain if needed . She is aware drain instructions will be given at DC.     Transition of Care Asessment: Insurance and Status: Insurance coverage has been reviewed Radiographer, therapeutic) Patient has primary care physician: Yes (Dr Quintin Alto) Home environment has been reviewed: lives alone Prior level of function:: independent Prior/Current Home Services: No current home services Social Determinants of Health Reivew: SDOH reviewed no interventions necessary Readmission risk has been reviewed: Yes (14%) Transition of care needs: transition of care needs identified, TOC will continue to follow

## 2022-10-11 NOTE — Progress Notes (Signed)
Physical Therapy Treatment Patient Details Name: Gabrielle Donovan MRN: [id number] DOB: Jun 08, 1945 Today's Date: 10/11/2022   History of Present Illness Gabrielle Donovan is a 77 y.o. female  who is admitted to [facility] on 10/08/2022 by way of transfer from [facility] ED with concern for perisplenic abscess after presenting with abdominal pain.    PT Comments  Patient eager to work with therapy. Initially felt she could walk without RW, however upon standing was slightly unsteady and requested to use RW. Required cues for upright posture and proximity to RW.     If plan is discharge home, recommend the following: Assistance with cooking/housework   Can travel by private vehicle        Equipment Recommendations  None recommended by PT (has a rollator)    Recommendations for Other Services       Precautions / Restrictions Precautions Precautions: Other (comment) Precaution Comments: JP drain Restrictions Weight Bearing Restrictions: No     Mobility  Bed Mobility Overal bed mobility: Modified Independent                  Transfers Overall transfer level: Needs assistance Equipment used: None Transfers: Sit to/from Stand Sit to Stand: Min guard           General transfer comment: slightly unsteady and decided she wanted RW for amb    Ambulation/Gait Ambulation/Gait assistance: Min guard Gait Distance (Feet): 170 Feet Assistive device: Rolling walker (2 wheels) Gait Pattern/deviations: Step-through pattern, Trunk flexed   Gait velocity interpretation: 1.31 - 2.62 ft/sec, indicative of limited community ambulator   General Gait Details: Tending to walk with trunk flexed, but able to extend to fully upright standing with repeated cues   Stairs             Wheelchair Mobility     Tilt Bed    Modified Rankin (Stroke Patients Only)       Balance Overall balance assessment: Needs assistance Sitting-balance support: No upper extremity  supported, Feet supported Sitting balance-Leahy Scale: Good     Standing balance support: No upper extremity supported, During functional activity Standing balance-Leahy Scale: Fair                              Cognition Arousal/Alertness: Awake/alert Behavior During Therapy: WFL for tasks assessed/performed Overall Cognitive Status: Within Functional Limits for tasks assessed                                          Exercises      General Comments        Pertinent Vitals/Pain Pain Assessment Pain Assessment: 0-10 Pain Score: 4  Pain Location: site of abdominal drain Pain Descriptors / Indicators: Sore Pain Intervention(s): Limited activity within patient's tolerance, Premedicated before session, Monitored during session    Home Living Family/patient expects to be discharged to:: Private residence Living Arrangements: Alone Available Help at Discharge: Family Type of Home: Mobile home Home Access: Stairs to enter   Entrance Stairs-Number of Steps: 3   Home Layout: One level Home Equipment: Agricultural consultant (2 wheels);Rollator (4 wheels);Gilmer Mor - single point Additional Comments: Daughter lives in [place], coming into town today. Pt is a retired Lawyer.    Prior Function            PT Goals (current goals can  now be found in the care plan section) Acute Rehab PT Goals Patient Stated Goal: Did not specifically state, but agreeable to amb Time For Goal Achievement: 10/24/22 Potential to Achieve Goals: Good Progress towards PT goals: Progressing toward goals    Frequency    Min 1X/week      PT Plan Current plan remains appropriate    Co-evaluation              AM-PAC PT "6 Clicks" Mobility   Outcome Measure  Help needed turning from your back to your side while in a flat bed without using bedrails?: None Help needed moving from lying on your back to sitting on the side of a flat bed without using bedrails?: A  Little Help needed moving to and from a bed to a chair (including a wheelchair)?: A Little Help needed standing up from a chair using your arms (e.g., wheelchair or bedside chair)?: A Little Help needed to walk in hospital room?: A Little Help needed climbing 3-5 steps with a railing? : A Little 6 Click Score: 19    End of Session Equipment Utilized During Treatment: Gait belt Activity Tolerance: Patient tolerated treatment well Patient left: with call bell/phone within reach;in chair (no chair alarm; pt a&ox4 and knows not to get up alone) Nurse Communication: Mobility status PT Visit Diagnosis: Unsteadiness on feet (R26.81);Other abnormalities of gait and mobility (R26.89)     Time: 1025-1040 PT Time Calculation (min) (ACUTE ONLY): 15 min  Charges:    $Gait Training: 8-22 mins PT General Charges $$ ACUTE PT VISIT: 1 Visit                      Gabrielle Donovan, PT Acute Rehabilitation Services  Office [phone]    Gabrielle Donovan 10/11/2022, 10:49 AM

## 2022-10-12 DIAGNOSIS — D733 Abscess of spleen: Secondary | ICD-10-CM | POA: Diagnosis not present

## 2022-10-12 LAB — COMPREHENSIVE METABOLIC PANEL WITH GFR
ALT: 18 U/L (ref 0–44)
AST: 29 U/L (ref 15–41)
Albumin: 2.8 g/dL — ABNORMAL LOW (ref 3.5–5.0)
Alkaline Phosphatase: 186 U/L — ABNORMAL HIGH (ref 38–126)
Anion gap: 10 (ref 5–15)
BUN: 8 mg/dL (ref 8–23)
CO2: 22 mmol/L (ref 22–32)
Calcium: 8.7 mg/dL — ABNORMAL LOW (ref 8.9–10.3)
Chloride: 101 mmol/L (ref 98–111)
Creatinine, Ser: 0.65 mg/dL (ref 0.44–1.00)
GFR, Estimated: >60 mL/min (ref >60)
Glucose, Bld: 89 mg/dL (ref 70–99)
Potassium: 3.8 mmol/L (ref 3.5–5.1)
Sodium: 133 mmol/L — ABNORMAL LOW (ref 135–145)
Total Bilirubin: 0.7 mg/dL (ref 0.3–1.2)
Total Protein: 6.3 g/dL — ABNORMAL LOW (ref 6.5–8.1)

## 2022-10-12 LAB — CBC WITH DIFFERENTIAL/PLATELET
Abs Immature Granulocytes: 0.13 K/uL — ABNORMAL HIGH (ref 0.00–0.07)
Basophils Absolute: 0.4 K/uL — ABNORMAL HIGH (ref 0.0–0.1)
Basophils Relative: 2 %
Eosinophils Absolute: 0.2 K/uL (ref 0.0–0.5)
Eosinophils Relative: 1 %
HCT: 28.5 % — ABNORMAL LOW (ref 36.0–46.0)
Hemoglobin: 9.4 g/dL — ABNORMAL LOW (ref 12.0–15.0)
Immature Granulocytes: 1 %
Lymphocytes Relative: 8 %
Lymphs Abs: 1.4 K/uL (ref 0.7–4.0)
MCH: 37.2 pg — ABNORMAL HIGH (ref 26.0–34.0)
MCHC: 33 g/dL (ref 30.0–36.0)
MCV: 112.6 fL — ABNORMAL HIGH (ref 80.0–100.0)
Monocytes Absolute: 1.7 K/uL — ABNORMAL HIGH (ref 0.1–1.0)
Monocytes Relative: 10 %
Neutro Abs: 14.2 K/uL — ABNORMAL HIGH (ref 1.7–7.7)
Neutrophils Relative %: 78 %
Platelets: 993 K/uL (ref 150–400)
RBC: 2.53 MIL/uL — ABNORMAL LOW (ref 3.87–5.11)
RDW: 25.4 % — ABNORMAL HIGH (ref 11.5–15.5)
WBC: 18.1 K/uL — ABNORMAL HIGH (ref 4.0–10.5)
nRBC: 3.1 % — ABNORMAL HIGH (ref 0.0–0.2)

## 2022-10-12 LAB — PROCALCITONIN: Procalcitonin: 0.12 ng/mL

## 2022-10-12 LAB — PHOSPHORUS: Phosphorus: 3.2 mg/dL (ref 2.5–4.6)

## 2022-10-12 LAB — BRAIN NATRIURETIC PEPTIDE: B Natriuretic Peptide: 92.3 pg/mL (ref 0.0–100.0)

## 2022-10-12 LAB — MAGNESIUM: Magnesium: 1.7 mg/dL (ref 1.7–2.4)

## 2022-10-12 NOTE — Progress Notes (Signed)
Id brief update   Ir splenic fluid aspirate cx ngtd  A/p Perisplenic fluid collection Pcv Recent mcv trauma   All appears hematoma at this time  Contact precaution F/u ir and surgery per their discretion Stop abx No need for id f/u Discussed with primary team

## 2022-10-12 NOTE — Progress Notes (Signed)
PROGRESS NOTE                                                                                                                                                                                                             Patient Demographics:    Gabrielle Donovan, is a 77 y.o. female, DOB - Jul 06, 1945, ZHY:[id number]  Outpatient Primary MD for the patient is Burdine, Ananias Pilgrim, MD    LOS - 4  Admit date - 10/08/2022    No chief complaint on file.      Brief Narrative (HPI from H&P)  -  77 y.o. female with medical history significant for essential pretension, acquired hypothyroidism, essential thrombocythemia, who is admitted to [facility] on 10/08/2022 by way of transfer from [facility] emergency department with concern for perisplenic abscess after presenting from home to [facility] with left upper quadrant abdominal pain for about 2 weeks in duration, workup suggestive of possible perisplenic abscess she was subsequently transferred to [facility].   Subjective:   Patient in bed, appears comfortable, denies any headache, no fever, no chest pain or pressure, no shortness of breath , no abdominal pain. No new focal weakness.   Assessment  & Plan :    Sepsis due to perisplenic abscess.  Patient had been sick for about 2 weeks prior to this, she was seen by IR underwent left upper quadrant CT-guided drainage with tube placement on 10/09/2022, currently on Rocephin and Flagyl, sepsis pathophysiology is improving, cause still not clear, requested ID to evaluate the patient.  Follow final cultures - negative x 3 days.   Dehydration with hyponatremia.  Hydrate with IV fluids.  Stable.  Essential thrombocythemia: Documented history of such, on anagrelide.  Monitor platelet counts.  Essential hypertension.  Placed on Coreg.  Hypothyroidism.  On Synthroid.  TSH stable.  3.5 cm simple appearing cystic lesion in the  retroperitoneum, likely benign in etiology. Per PCP.       Condition - Extremely Guarded  Family Communication  :  None  Code Status :  Full  Consults  :  IR, ID  PUD Prophylaxis :    Procedures  :     CT-guided spleen drain placement by IR on 10/09/2022.      Disposition Plan  :    Status is: Inpatient  DVT Prophylaxis  :  heparin injection 5,000 Units Start: 10/10/22 1400 Place and maintain sequential compression device Start: 10/10/22 1012 SCDs Start: 10/09/22 0155    Lab Results  Component Value Date   PLT 993 (HH) 10/12/2022    Diet :  Diet Order             Diet regular Room service appropriate? Yes; Fluid consistency: Thin  Diet effective now                    Inpatient Medications  Scheduled Meds:  aspirin EC  81 mg Oral Daily   brimonidine  1 drop Right Eye TID   carvedilol  3.125 mg Oral BID WC   heparin injection (subcutaneous)  5,000 Units Subcutaneous Q8H   hydroxyurea  1,000 mg Oral Daily   And   hydroxyurea  500 mg Oral QPM   levothyroxine  50 mcg Oral Once per day on Tuesday Thursday Saturday   And   levothyroxine  75 mcg Oral Once per day on Sunday Monday Wednesday Friday   loratadine  10 mg Oral Daily   sodium chloride flush  5 mL Intracatheter Q8H   Continuous Infusions:  cefTRIAXone (ROCEPHIN)  IV 200 mL/hr at 10/12/22 0720   metronidazole 500 mg (10/12/22 0818)   PRN Meds:.acetaminophen **OR** acetaminophen, fentaNYL (SUBLIMAZE) injection, hydrALAZINE, morphine injection, naLOXone (NARCAN)  injection, ondansetron (ZOFRAN) IV, oxyCODONE  Antibiotics  :    Anti-infectives (From admission, onward)    Start     Dose/Rate Route Frequency Ordered Stop   10/09/22 0215  cefTRIAXone (ROCEPHIN) 2 g in sodium chloride 0.9 % 100 mL IVPB        2 g 200 mL/hr over 30 Minutes Intravenous Daily at bedtime 10/09/22 0207     10/09/22 0215  metroNIDAZOLE (FLAGYL) IVPB 500 mg        50 0 mg 100 mL/hr over 60 Minutes Intravenous 2  times daily 10/09/22 0207           Objective:   Vitals:   10/11/22 1639 10/11/22 2015 10/12/22 0630 10/12/22 0835  BP: (!) 151/76 (!) 143/83 136/70 133/82  Pulse: 95   83  Resp: 18   20  Temp: 98.1 F (36.7 C) 98.3 F (36.8 C) 98.1 F (36.7 C) 98.2 F (36.8 C)  TempSrc: Oral Oral Oral Oral  SpO2: 98%   98%  Weight:      Height:        Wt Readings from Last 3 Encounters:  10/11/22 59.2 kg  01/25/20 57.6 kg  01/18/19 57.6 kg     Intake/Output Summary (Last 24 hours) at 10/12/2022 0918 Last data filed at 10/12/2022 0720 Gross per 24 hour  Intake 300 ml  Output 10 ml  Net 290 ml     Physical Exam  Awake Alert, No new F.N deficits, Normal affect [place].AT,PERRAL Supple Neck, No JVD,   Symmetrical Chest wall movement, Good air movement bilaterally, CTAB RRR,No Gallops,Rubs or new Murmurs,  +ve B.Sounds, Abd Soft, No tenderness, LUQ drain - bloody No Cyanosis, Clubbing or edema       Data Review:    Recent Labs  Lab 10/09/22 0336 10/10/22 0122 10/11/22 0644 10/12/22 0258  WBC 22.4* 18.0* 14.9* 18.1*  HGB 9.0* 9.0* 9.4* 9.4*  HCT 28.4* 27.7* 28.9* 28.5*  PLT 898* 917* 933* 993*  MCV 110.5* 114.9* 113.8* 112.6*  MCH 35.0* 37.3* 37.0* 37.2*  MCHC 31.7 32.5 32.5 33.0  RDW 26.5* 25.7* 25.9* 25.4*  LYMPHSABS 1.4 0.7  1.1 1.4  MONOABS 1.8* 2.2* 1.4* 1.7*  EOSABS 0.1 0.2 0.1 0.2  BASOSABS 0.3* 0.5* 0.3* 0.4*    Recent Labs  Lab 10/09/22 0336 10/09/22 1437 10/10/22 0122 10/11/22 0644 10/12/22 0258  NA 134* 130* 130* 132* 133*  K 2.8* 4.8 3.5 3.8 3.8  CL 95* 99 98 98 101  CO2 25 23 21* 21* 22  ANIONGAP 14 8 11 13 10   GLUCOSE 85 153* 102* 89 89  BUN 8 6* 9 7* 8  CREATININE 0.76 0.83 0.75 0.75 0.65  AST 28  --  33 32 29  ALT 21  --  21 19 18   ALKPHOS 166*  --  226* 203* 186*  BILITOT 0.7  --  0.5 0.5 0.7  ALBUMIN 3.0*  --  2.7* 2.9* 2.8*  CRP  --   --  15.8* 14.8* 12.2*  PROCALCITON  --   --  0.50 0.16 0.12  LATICACIDVEN 0.8  --   --   --   --    TSH 6.055*  --   --   --   --   BNP  --   --  107.0* 119.3* 92.3  MG 2.1  --  1.9 1.8 1.7  CALCIUM 8.8* 8.1* 8.3* 8.7* 8.7*      Recent Labs  Lab 10/09/22 0336 10/09/22 1437 10/10/22 0122 10/11/22 0644 10/12/22 0258  CRP  --   --  15.8* 14.8* 12.2*  PROCALCITON  --   --  0.50 0.16 0.12  LATICACIDVEN 0.8  --   --   --   --   TSH 6.055*  --   --   --   --   BNP  --   --  107.0* 119.3* 92.3  MG 2.1  --  1.9 1.8 1.7  CALCIUM 8.8* 8.1* 8.3* 8.7* 8.7*    No results for input(s): "TSH", "T4TOTAL", "T3FREE", "THYROIDAB" in the last 72 hours.  Invalid input(s): "FREET3"  Micro Results Recent Results (from the past 240 hour(s))  Culture, blood (Routine X 2) w Reflex to ID Panel     Status: None (Preliminary result)   Collection Time: 10/09/22  3:36 AM   Specimen: BLOOD  Result Value Ref Range Status   Specimen Description BLOOD BLOOD RIGHT ARM  Final   Special Requests   Final    BOTTLES DRAWN AEROBIC AND ANAEROBIC Blood Culture adequate volume   Culture   Final    NO GROWTH 3 DAYS Performed at [facility], [address] [place], [place] [address]    Report Status PENDING  Incomplete  Culture, blood (Routine X 2) w Reflex to ID Panel     Status: None (Preliminary result)   Collection Time: 10/09/22  3:36 AM   Specimen: BLOOD  Result Value Ref Range Status   Specimen Description BLOOD BLOOD RIGHT ARM  Final   Special Requests   Final    BOTTLES DRAWN AEROBIC AND ANAEROBIC Blood Culture adequate volume   Culture   Final    NO GROWTH 3 DAYS Performed at [facility], [address], [place], [place] [address]    Report Status PENDING  Incomplete  Aerobic/Anaerobic Culture w Gram Stain (surgical/deep wound)     Status: None (Preliminary result)   Collection Time: 10/09/22 11:11 AM   Specimen: Abscess  Result Value Ref Range Status   Specimen Description ABSCESS  Final   Special Requests ABDOMEN  Final   Gram Stain   Final  FEW WBC PRESENT,  PREDOMINANTLY PMN NO ORGANISMS SEEN    Culture   Final    NO GROWTH 2 DAYS NO ANAEROBES ISOLATED; CULTURE IN PROGRESS FOR 5 DAYS Performed at [facility], [address], [place], [place] [address]    Report Status PENDING  Incomplete    Radiology Reports ECHOCARDIOGRAM COMPLETE  Result Date: 10/10/2022    ECHOCARDIOGRAM REPORT   Patient Name:   Gabrielle Donovan Date of Exam: 10/10/2022 Medical Rec #:  [id number]     Height:       66.0 in Accession #:    [id number]    Weight:       127.4 lb Date of Birth:  1945/12/08     BSA:          1.651 m Patient Age:    77 years      BP:           145/81 mmHg Patient Gender: F             HR:           88 bpm. Exam Location:  Inpatient Procedure: 2D Echo, Color Doppler and Cardiac Doppler Indications:    acute diastolic chf  History:        Patient has no prior history of Echocardiogram examinations.                 Sepsis; Risk Factors:Hypertension.  Sonographer:    Delcie Roch RDCS Referring Phys: Effie Shy [facility] IMPRESSIONS  1. Left ventricular ejection fraction, by estimation, is 60 to 65%. The left ventricle has normal function. The left ventricle has no regional wall motion abnormalities. Left ventricular diastolic parameters are consistent with Grade I diastolic dysfunction (impaired relaxation).  2. Right ventricular systolic function is normal. The right ventricular size is normal.  3. The mitral valve is normal in structure. Mild mitral valve regurgitation. No evidence of mitral stenosis.  4. The aortic valve is normal in structure. Aortic valve regurgitation is not visualized. No aortic stenosis is present.  5. Pulmonic valve regurgitation is moderate.  6. The inferior vena cava is normal in size with greater than 50% respiratory variability, suggesting right atrial pressure of 3 mmHg. FINDINGS  Left Ventricle: Left ventricular ejection fraction, by estimation, is 60 to 65%. The left ventricle has normal function. The left ventricle has no  regional wall motion abnormalities. The left ventricular internal cavity size was normal in size. There is  no left ventricular hypertrophy. Left ventricular diastolic parameters are consistent with Grade I diastolic dysfunction (impaired relaxation). Right Ventricle: The right ventricular size is normal. No increase in right ventricular wall thickness. Right ventricular systolic function is normal. Left Atrium: Left atrial size was normal in size. Right Atrium: Right atrial size was normal in size. Pericardium: There is no evidence of pericardial effusion. Mitral Valve: The mitral valve is normal in structure. Mild mitral valve regurgitation. No evidence of mitral valve stenosis. Tricuspid Valve: The tricuspid valve is normal in structure. Tricuspid valve regurgitation is trivial. No evidence of tricuspid stenosis. Aortic Valve: The aortic valve is normal in structure. Aortic valve regurgitation is not visualized. No aortic stenosis is present. Pulmonic Valve: The pulmonic valve was normal in structure. Pulmonic valve regurgitation is moderate. No evidence of pulmonic stenosis. Aorta: The aortic root is normal in size and structure. Venous: The inferior vena cava is normal in size with greater than 50% respiratory variability, suggesting right atrial pressure of 3 mmHg.  IAS/Shunts: No atrial level shunt detected by color flow Doppler.  LEFT VENTRICLE PLAX 2D LVIDd:         4.30 cm   Diastology LVIDs:         2.90 cm   LV e' medial:  8.59 cm/s LV PW:         0.80 cm   LV e' lateral: 8.49 cm/s LV IVS:        0.90 cm LVOT diam:     1.90 cm LV SV:         54 LV SV Index:   33 LVOT Area:     2.84 cm  RIGHT VENTRICLE            IVC RV Basal diam:  2.80 cm    IVC diam: 2.10 cm RV S prime:     9.68 cm/s TAPSE (M-mode): 2.2 cm LEFT ATRIUM             Index        RIGHT ATRIUM           Index LA diam:        3.20 cm 1.94 cm/m   RA Area:     16.60 cm LA Vol (A2C):   52.9 ml 32.04 ml/m  RA Volume:   44.50 ml  26.95 ml/m  LA Vol (A4C):   35.6 ml 21.56 ml/m LA Biplane Vol: 43.7 ml 26.47 ml/m  AORTIC VALVE LVOT Vmax:   95.00 cm/s LVOT Vmean:  64.000 cm/s LVOT VTI:    0.191 m  AORTA Ao Root diam: 2.50 cm Ao Asc diam:  2.50 cm TRICUSPID VALVE TV Peak grad:   28.1 mmHg TV Vmax:        2.65 m/s TR Peak grad:   16.5 mmHg TR Vmax:        203.00 cm/s  SHUNTS Systemic VTI:  0.19 m Systemic Diam: 1.90 cm Donato Schultz MD Electronically signed by Donato Schultz MD Signature Date/Time: 10/10/2022/3:11:39 PM    Final    CT GUIDED PERITONEAL/RETROPERITONEAL FLUID DRAIN BY PERC CATH  Result Date: 10/09/2022 INDICATION: 77 year old female with perisplenic fluid collection referred for aspiration/drainage EXAM: ULTRASOUND-GUIDED DRAINAGE OF PERISPLENIC FLUID TECHNIQUE: Multidetector CT imaging of the abdomen was performed following the standard protocol without IV contrast. RADIATION DOSE REDUCTION: This exam was performed according to the departmental dose-optimization program which includes automated exposure control, adjustment of the mA and/or kV according to patient size and/or use of iterative reconstruction technique. MEDICATIONS: None ANESTHESIA/SEDATION: Moderate (conscious) sedation was not employed during this procedure. A total of Versed 1.0 mg and Fentanyl 0 mcg was administered intravenously by the radiology nurse. Total intra-service moderate Sedation Time: 0 minutes. The patient's level of consciousness and vital signs were monitored continuously by radiology nursing throughout the procedure under my direct supervision. COMPLICATIONS: None PROCEDURE: Informed written consent was obtained from the patient after a thorough discussion of the procedural risks, benefits and alternatives. All questions were addressed. Maximal Sterile Barrier Technique was utilized including caps, mask, sterile gowns, sterile gloves, sterile drape, hand hygiene and skin antiseptic. A timeout was performed prior to the initiation of the procedure. Patient was  positioned supine on the CT gantry table. Scout CT acquired for planning purposes. The patient was prepped and draped in the usual sterile fashion. 1% lidocaine was used for local anesthesia. Using CT guidance, Yueh needle was advanced into the dense fluid collection inferior to the spleen, corresponding to region of the lowest density on the  preoperative contrast-enhanced CT. Once the Yueh needle was in position, modified Seldinger technique was used to place a 10 [place] drain. Very scant amount of dark maroon blood products aspirated. Sample was sent for culture. Final CT was acquired confirming adequate location of the drain. Drain was sutured in position and attached to bulb suction. Patient tolerated the procedure well and remained hemodynamically stable throughout. No complications were encountered and no significant blood loss. IMPRESSION: Status post CT-guided drainage of perisplenic fluid, aspirating small volume of dark blood coagulated blood products. Signed, Yvone Neu. Reyne Dumas, ABVM, RPVI [facility] Radiology PLAN: If the culture confirms sterile collection, would advise removal of the drain immediately. Electronically Signed   By: Gilmer Mor D.O.   On: 10/09/2022 13:09      Signature  -   Susa Raring M.D on 10/12/2022 at 9:18 AM   -  To page go to [email or web address]

## 2022-10-12 NOTE — TOC Progression Note (Signed)
Transition of Care [facility]) - Progression Note    Patient Details  Name: ADEN VREDEVELD MRN: [id number] Date of Birth: October 01, 1945  [facility] CM/SW Contact  Gordy Clement, RN Phone Number: 10/12/2022, 10:52 AM  Clinical Narrative:     [facility] of [facility] has agreed to accept this Patient for [facility] SN and PT   TOC will continue to follow patient for any additional discharge needs   AVS has been updated           Expected Discharge Plan and Services                                               Social Determinants of Health (SDOH) Interventions SDOH Screenings   Food Insecurity: No Food Insecurity (12/28/2021)   Received from [facility]  Transportation Needs: No Transportation Needs (12/28/2021)   Received from [facility]  Financial Resource Strain: Low Risk  (12/28/2021)   Received from [facility]  Physical Activity: Inactive (12/28/2021)   Received from [facility]  Social Connections: Moderately Integrated (12/28/2021)   Received from [facility]  Stress: No Stress Concern Present (12/28/2021)   Received from [facility]  Tobacco Use: Low Risk  (10/09/2022)  Health Literacy: Low Risk  (12/28/2021)   Received from [facility]    Readmission Risk Interventions     No data to display

## 2022-10-13 ENCOUNTER — Other Ambulatory Visit: Payer: Self-pay | Admitting: Student

## 2022-10-13 ENCOUNTER — Other Ambulatory Visit (HOSPITAL_COMMUNITY): Payer: Self-pay

## 2022-10-13 DIAGNOSIS — D733 Abscess of spleen: Secondary | ICD-10-CM | POA: Diagnosis not present

## 2022-10-13 MED ORDER — CARVEDILOL 3.125 MG PO TABS
3.1250 mg | ORAL_TABLET | Freq: Two times a day (BID) | ORAL | 0 refills | Status: DC
  Filled 2022-10-13: qty 60, 30d supply, fill #0

## 2022-10-13 MED ORDER — OXYCODONE HCL 5 MG PO TABS
5.0000 mg | ORAL_TABLET | Freq: Four times a day (QID) | ORAL | 0 refills | Status: DC | PRN
  Filled 2022-10-13: qty 10, 3d supply, fill #0

## 2022-10-13 MED FILL — Morphine Sulfate IV Soln 4 MG/ML: INTRAVENOUS | Qty: 1 | Status: AC

## 2022-10-13 NOTE — Discharge Instructions (Signed)
JP drain - flush drain QD with 5 cc NS, record output QD, dressing changes every 2-3 days or earlier if soiled   Follow with Primary MD Juliette Alcide, MD in 7 days   Get CBC, CMP, 2 view Chest X ray -  checked next visit with your primary MD    Activity: As tolerated with Full fall precautions use walker/cane & assistance as needed  Disposition Home    Diet: Heart Healthy    Special Instructions: If you have smoked or chewed Tobacco  in the last 2 yrs please stop smoking, stop any regular Alcohol  and or any Recreational drug use.  On your next visit with your primary care physician please Get Medicines reviewed and adjusted.  Please request your Prim.MD to go over all Hospital Tests and Procedure/Radiological results at the follow up, please get all Hospital records sent to your Prim MD by signing hospital release before you go home.  If you experience worsening of your admission symptoms, develop shortness of breath, life threatening emergency, suicidal or homicidal thoughts you must seek medical attention immediately by calling 911 or calling your MD immediately  if symptoms less severe.  You Must read complete instructions/literature along with all the possible adverse reactions/side effects for all the Medicines you take and that have been prescribed to you. Take any new Medicines after you have completely understood and accpet all the possible adverse reactions/side effects.

## 2022-10-13 NOTE — Progress Notes (Signed)
OT Cancellation Note  Patient Details Name: Gabrielle Donovan MRN: [id number] DOB: Jun 09, 1945   Cancelled Treatment:    Reason Eval/Treat Not Completed: Other (comment) (Pt preparing for discharge upon OT arrival and declining skilled OT session. OT provided pt with energy conservation handout and provided brief education with pt verbalizing understanding. OT to continue to follow pt acutely as appropriate.)  [name] "Orson Eva., OTR/L, MA Acute Rehab [phone]   [name] 10/13/2022, 12:04 PM

## 2022-10-13 NOTE — TOC Transition Note (Signed)
Transition of Care [facility]) - CM/SW Discharge Note   Patient Details  Name: Gabrielle Donovan MRN: [id number] Date of Birth: [birth year]  [facility]) CM/SW Contact:  Gordy Clement, RN Phone Number: 10/13/2022, 9:30 AM   Clinical Narrative:     Patient to dc to home today.  [facility] will provide skilled nursing and PT in the home. No additional TOC needs   Family to transport           Patient Goals and CMS Choice      Discharge Placement                         Discharge Plan and Services Additional resources added to the After Visit Summary for                                       Social Determinants of Health (SDOH) Interventions SDOH Screenings   Food Insecurity: No Food Insecurity (12/28/2021)   Received from [facility]  Transportation Needs: No Transportation Needs (12/28/2021)   Received from [facility]  Financial Resource Strain: Low Risk  (12/28/2021)   Received from [facility]  Physical Activity: Inactive (12/28/2021)   Received from [facility]  Social Connections: Moderately Integrated (12/28/2021)   Received from [facility]  Stress: No Stress Concern Present (12/28/2021)   Received from [facility]  Tobacco Use: Low Risk  (10/09/2022)  Health Literacy: Low Risk  (12/28/2021)   Received from [facility]     Readmission Risk Interventions     No data to display

## 2022-10-13 NOTE — Discharge Summary (Signed)
Gabrielle Donovan VHQ:[id number] DOB: 05-01-1945 DOA: 10/08/2022  PCP: Juliette Alcide, MD  Admit date: 10/08/2022  Discharge date: 10/13/2022  Admitted From: Home   Disposition:  Home   Recommendations for Outpatient Follow-up:   Follow up with PCP in 1-2 weeks  PCP Please obtain BMP/CBC, 2 view CXR in 1week,  (see Discharge instructions)   PCP Please follow up on the following pending results:    Home Health: PT, RN   Equipment/Devices: as below  Consultations: IR, ID Discharge Condition: Stable    CODE STATUS: Full    Diet Recommendation: Heart Healthy     CC Abd pain  Brief history of present illness from the day of admission and additional interim summary    77 y.o. female with medical history significant for essential pretension, acquired hypothyroidism, essential thrombocythemia, who is admitted to [facility] on 10/08/2022 by way of transfer from [facility] emergency department with concern for perisplenic abscess after presenting from home to [facility] with left upper quadrant abdominal pain for about 2 weeks in duration, workup suggestive of possible perisplenic abscess she was subsequently transferred to [facility].                                                                  Hospital Course   SIRs due to perisplenic spontaneous hematoma.  Patient had been sick for about 2 weeks prior to this, she was seen by IR underwent left upper quadrant CT-guided drainage with tube placement on 10/09/2022, initially there was suspicion of perisplenic abscess and she was placed on Rocephin and Flagyl, she was seen by ID, cultures - negative x 3 days.  Per ID this is not consistent with infectious abscess, this was likely spontaneous hematoma, stop all antibiotics discharge home with JP drain,  drain care taught to the patient, follow-up with IR in 1 week.  She is currently symptom-free and eager to go home.   Dehydration with hyponatremia.  Hydrate with IV fluids.  Stable.   Essential thrombocythemia: Documented history of such, on anagrelide.  Monitor platelet counts.   Essential hypertension.  Placed on Coreg.   Hypothyroidism.  On Synthroid.  TSH stable.   3.5 cm simple appearing cystic lesion in the retroperitoneum, likely benign in etiology. Per PCP.   Discharge diagnosis     Principal Problem:   Splenic abscess Active Problems:   Sepsis (HCC)   Hypokalemia   Acute hyponatremia   Polycythemia vera (HCC)   Essential hypertension   Acquired hypothyroidism    Discharge instructions    Discharge Instructions     Discharge instructions   Complete by: As directed    JP drain - flush drain QD with 5 cc NS, record output QD, dressing changes every 2-3 days or earlier if  soiled   Follow with Primary MD Juliette Alcide, MD in 7 days   Get CBC, CMP, 2 view Chest X ray -  checked next visit with your primary MD    Activity: As tolerated with Full fall precautions use walker/cane & assistance as needed  Disposition Home    Diet: Heart Healthy    Special Instructions: If you have smoked or chewed Tobacco  in the last 2 yrs please stop smoking, stop any regular Alcohol  and or any Recreational drug use.  On your next visit with your primary care physician please Get Medicines reviewed and adjusted.  Please request your Prim.MD to go over all Hospital Tests and Procedure/Radiological results at the follow up, please get all Hospital records sent to your Prim MD by signing hospital release before you go home.  If you experience worsening of your admission symptoms, develop shortness of breath, life threatening emergency, suicidal or homicidal thoughts you must seek medical attention immediately by calling 911 or calling your MD immediately  if symptoms less  severe.  You Must read complete instructions/literature along with all the possible adverse reactions/side effects for all the Medicines you take and that have been prescribed to you. Take any new Medicines after you have completely understood and accpet all the possible adverse reactions/side effects.   Discharge wound care:   Complete by: As directed    JP drain - flush drain QD with 5 cc NS, record output QD, dressing changes every 2-3 days or earlier if soiled   Increase activity slowly   Complete by: As directed        Discharge Medications   Allergies as of 10/13/2022       Reactions   Codeine Nausea And Vomiting   Hydrocodone Nausea And Vomiting   Other         Medication List     TAKE these medications    brimonidine 0.2 % ophthalmic solution Commonly known as: ALPHAGAN Place 1 drop into the right eye at bedtime.   carvedilol 3.125 MG tablet Commonly known as: COREG Take 1 tablet (3.125 mg total) by mouth 2 (two) times daily with a meal.   cetirizine 10 MG tablet Commonly known as: ZYRTEC Take 10 mg by mouth daily.   chlorthalidone 25 MG tablet Commonly known as: HYGROTON Take 25 mg by mouth daily.   folic acid 1 MG tablet Commonly known as: FOLVITE Take 1 mg by mouth daily.   hydroxyurea 500 MG capsule Commonly known as: HYDREA Take 500 mg by mouth in the morning, at noon, and at bedtime.   latanoprost 0.005 % ophthalmic solution Commonly known as: XALATAN Place 1 drop into the right eye at bedtime.   oxyCODONE 5 MG immediate release tablet Commonly known as: Oxy IR/ROXICODONE Take 1 tablet (5 mg total) by mouth every 6 (six) hours as needed for severe pain.   Synthroid 75 MCG tablet Generic drug: levothyroxine Take 75 mcg by mouth every other day. Alternating with   Synthroid 50 MCG tablet Generic drug: levothyroxine Take 50 mcg by mouth every other day. Alternating with               Discharge Care Instructions  (From  admission, onward)           Start     Ordered   10/13/22 0000  Discharge wound care:       Comments: JP drain - flush drain QD with 5 cc NS, record output  QD, dressing changes every 2-3 days or earlier if soiled   10/13/22 0932             Follow-up Information     [facility] [place] Follow up.   Why: [facility] priv[facility] nursing and physical therapy  They will contact you within 48 hours of DC. Contact information: [address]  [place], [place]  [address]  [phone]        Juliette Alcide, MD. Schedule an appointment as soon as possible for a visit in 1 week(s).   Specialty: Family Medicine Contact information: [address] [place] [address] [phone]         Irish Lack, MD. Schedule an appointment as soon as possible for a visit in 1 week(s).   Specialties: Interventional Radiology, Radiology Why: LUQ JP drain Contact information: [address] [place] [address] [phone]                 Major procedures and Radiology Reports - PLEASE review detailed and final reports thoroughly  -       ECHOCARDIOGRAM COMPLETE  Result Date: 10/10/2022    ECHOCARDIOGRAM REPORT   Patient Name:   Gabrielle Donovan Date of Exam: 10/10/2022 Medical Rec #:  [id number]     Height:       66.0 in Accession #:    [id number]    Weight:       127.4 lb Date of Birth:  1945/04/10     BSA:          1.651 m Patient Age:    77 years      BP:           145/81 mmHg Patient Gender: F             HR:           88 bpm. Exam Location:  Inpatient Procedure: 2D Echo, Color Doppler and Cardiac Doppler Indications:    acute diastolic chf  History:        Patient has no prior history of Echocardiogram examinations.                 Sepsis; Risk Factors:Hypertension.  Sonographer:    Delcie Roch RDCS Referring Phys: Effie Shy [facility] IMPRESSIONS  1. Left ventricular ejection fraction, by  estimation, is 60 to 65%. The left ventricle has normal function. The left ventricle has no regional wall motion abnormalities. Left ventricular diastolic parameters are consistent with Grade I diastolic dysfunction (impaired relaxation).  2. Right ventricular systolic function is normal. The right ventricular size is normal.  3. The mitral valve is normal in structure. Mild mitral valve regurgitation. No evidence of mitral stenosis.  4. The aortic valve is normal in structure. Aortic valve regurgitation is not visualized. No aortic stenosis is present.  5. Pulmonic valve regurgitation is moderate.  6. The inferior vena cava is normal in size with greater than 50% respiratory variability, suggesting right atrial pressure of 3 mmHg. FINDINGS  Left Ventricle: Left ventricular ejection fraction, by estimation, is 60 to 65%. The left ventricle has normal function. The left ventricle has no regional wall motion abnormalities. The left ventricular internal cavity size was normal in size. There is  no left ventricular hypertrophy. Left ventricular diastolic parameters are consistent with Grade I diastolic dysfunction (impaired relaxation). Right Ventricle: The right ventricular size is normal. No increase in right ventricular wall thickness.  Right ventricular systolic function is normal. Left Atrium: Left atrial size was normal in size. Right Atrium: Right atrial size was normal in size. Pericardium: There is no evidence of pericardial effusion. Mitral Valve: The mitral valve is normal in structure. Mild mitral valve regurgitation. No evidence of mitral valve stenosis. Tricuspid Valve: The tricuspid valve is normal in structure. Tricuspid valve regurgitation is trivial. No evidence of tricuspid stenosis. Aortic Valve: The aortic valve is normal in structure. Aortic valve regurgitation is not visualized. No aortic stenosis is present. Pulmonic Valve: The pulmonic valve was normal in structure. Pulmonic valve regurgitation  is moderate. No evidence of pulmonic stenosis. Aorta: The aortic root is normal in size and structure. Venous: The inferior vena cava is normal in size with greater than 50% respiratory variability, suggesting right atrial pressure of 3 mmHg. IAS/Shunts: No atrial level shunt detected by color flow Doppler.  LEFT VENTRICLE PLAX 2D LVIDd:         4.30 cm   Diastology LVIDs:         2.90 cm   LV e' medial:  8.59 cm/s LV PW:         0.80 cm   LV e' lateral: 8.49 cm/s LV IVS:        0.90 cm LVOT diam:     1.90 cm LV SV:         54 LV SV Index:   33 LVOT Area:     2.84 cm  RIGHT VENTRICLE            IVC RV Basal diam:  2.80 cm    IVC diam: 2.10 cm RV S prime:     9.68 cm/s TAPSE (M-mode): 2.2 cm LEFT ATRIUM             Index        RIGHT ATRIUM           Index LA diam:        3.20 cm 1.94 cm/m   RA Area:     16.60 cm LA Vol (A2C):   52.9 ml 32.04 ml/m  RA Volume:   44.50 ml  26.95 ml/m LA Vol (A4C):   35.6 ml 21.56 ml/m LA Biplane Vol: 43.7 ml 26.47 ml/m  AORTIC VALVE LVOT Vmax:   95.00 cm/s LVOT Vmean:  64.000 cm/s LVOT VTI:    0.191 m  AORTA Ao Root diam: 2.50 cm Ao Asc diam:  2.50 cm TRICUSPID VALVE TV Peak grad:   28.1 mmHg TV Vmax:        2.65 m/s TR Peak grad:   16.5 mmHg TR Vmax:        203.00 cm/s  SHUNTS Systemic VTI:  0.19 m Systemic Diam: 1.90 cm Donato Schultz MD Electronically signed by Donato Schultz MD Signature Date/Time: 10/10/2022/3:11:39 PM    Final    CT GUIDED PERITONEAL/RETROPERITONEAL FLUID DRAIN BY PERC CATH  Result Date: 10/09/2022 INDICATION: 77 year old female with perisplenic fluid collection referred for aspiration/drainage EXAM: ULTRASOUND-GUIDED DRAINAGE OF PERISPLENIC FLUID TECHNIQUE: Multidetector CT imaging of the abdomen was performed following the standard protocol without IV contrast. RADIATION DOSE REDUCTION: This exam was performed according to the departmental dose-optimization program which includes automated exposure control, adjustment of the mA and/or kV according to  patient size and/or use of iterative reconstruction technique. MEDICATIONS: None ANESTHESIA/SEDATION: Moderate (conscious) sedation was not employed during this procedure. A total of Versed 1.0 mg and Fentanyl 0 mcg was administered intravenously by the radiology nurse. Total intra-service  moderate Sedation Time: 0 minutes. The patient's level of consciousness and vital signs were monitored continuously by radiology nursing throughout the procedure under my direct supervision. COMPLICATIONS: None PROCEDURE: Informed written consent was obtained from the patient after a thorough discussion of the procedural risks, benefits and alternatives. All questions were addressed. Maximal Sterile Barrier Technique was utilized including caps, mask, sterile gowns, sterile gloves, sterile drape, hand hygiene and skin antiseptic. A timeout was performed prior to the initiation of the procedure. Patient was positioned supine on the CT gantry table. Scout CT acquired for planning purposes. The patient was prepped and draped in the usual sterile fashion. 1% lidocaine was used for local anesthesia. Using CT guidance, Yueh needle was advanced into the dense fluid collection inferior to the spleen, corresponding to region of the lowest density on the preoperative contrast-enhanced CT. Once the Yueh needle was in position, modified Seldinger technique was used to place a 10 [place] drain. Very scant amount of dark maroon blood products aspirated. Sample was sent for culture. Final CT was acquired confirming adequate location of the drain. Drain was sutured in position and attached to bulb suction. Patient tolerated the procedure well and remained hemodynamically stable throughout. No complications were encountered and no significant blood loss. IMPRESSION: Status post CT-guided drainage of perisplenic fluid, aspirating small volume of dark blood coagulated blood products. Signed, Yvone Neu. Reyne Dumas, ABVM, RPVI [facility] Radiology PLAN: If the culture confirms sterile collection, would advise removal of the drain immediately. Electronically Signed   By: Gilmer Mor D.O.   On: 10/09/2022 13:09    Micro Results     Recent Results (from the past 240 hour(s))  Culture, blood (Routine X 2) w Reflex to ID Panel     Status: None (Preliminary result)   Collection Time: 10/09/22  3:36 AM   Specimen: BLOOD  Result Value Ref Range Status   Specimen Description BLOOD BLOOD RIGHT ARM  Final   Special Requests   Final    BOTTLES DRAWN AEROBIC AND ANAEROBIC Blood Culture adequate volume   Culture   Final    NO GROWTH 4 DAYS Performed at [facility], [address], [place], [place] [address]    Report Status PENDING  Incomplete  Culture, blood (Routine X 2) w Reflex to ID Panel     Status: None (Preliminary result)   Collection Time: 10/09/22  3:36 AM   Specimen: BLOOD  Result Value Ref Range Status   Specimen Description BLOOD BLOOD RIGHT ARM  Final   Special Requests   Final    BOTTLES DRAWN AEROBIC AND ANAEROBIC Blood Culture adequate volume   Culture   Final    NO GROWTH 4 DAYS Performed at [facility], [address] [place], [place] [address]    Report Status PENDING  Incomplete  Aerobic/Anaerobic Culture w Gram Stain (surgical/deep wound)     Status: None (Preliminary result)   Collection Time: 10/09/22 11:11 AM   Specimen: Abscess  Result Value Ref Range Status   Specimen Description ABSCESS  Final   Special Requests ABDOMEN  Final   Gram Stain   Final    FEW WBC PRESENT, PREDOMINANTLY PMN NO ORGANISMS SEEN    Culture   Final    NO GROWTH 3 DAYS NO ANAEROBES ISOLATED; CULTURE IN PROGRESS FOR 5 DAYS Performed at [facility], [address], [place], [place] [address]    Report Status PENDING  Incomplete  Today   Subjective    Gabrielle Donovan today has no headache,no chest abdominal pain,no new weakness tingling or numbness, feels much  better wants to go home today.     Objective   Blood pressure 124/70, pulse 91, temperature 97.6 F (36.4 C), temperature source Oral, resp. rate 18, height 5\' 6"  (1.676 m), weight 55 kg, SpO2 97%.   Intake/Output Summary (Last 24 hours) at 10/13/2022 0932 Last data filed at 10/13/2022 0645 Gross per 24 hour  Intake 220 ml  Output 12.5 ml  Net 207.5 ml    Exam  Awake Alert, No new F.N deficits,    [place].AT,PERRAL Supple Neck,   Symmetrical Chest wall movement, Good air movement bilaterally, CTAB RRR,No Gallops,   +ve B.Sounds, Abd Soft, Non tender, LUQ drain stable No Cyanosis, Clubbing or edema    Data Review   Recent Labs  Lab 10/09/22 0336 10/10/22 0122 10/11/22 0644 10/12/22 0258 10/13/22 0024  WBC 22.4* 18.0* 14.9* 18.1* 16.5*  HGB 9.0* 9.0* 9.4* 9.4* 9.1*  HCT 28.4* 27.7* 28.9* 28.5* 27.4*  PLT 898* 917* 933* 993* 949*  MCV 110.5* 114.9* 113.8* 112.6* 112.3*  MCH 35.0* 37.3* 37.0* 37.2* 37.3*  MCHC 31.7 32.5 32.5 33.0 33.2  RDW 26.5* 25.7* 25.9* 25.4* 25.4*  LYMPHSABS 1.4 0.7 1.1 1.4 1.0  MONOABS 1.8* 2.2* 1.4* 1.7* 1.2*  EOSABS 0.1 0.2 0.1 0.2 0.2  BASOSABS 0.3* 0.5* 0.3* 0.4* 0.7*    Recent Labs  Lab 10/09/22 0336 10/09/22 1437 10/10/22 0122 10/11/22 0644 10/12/22 0258 10/13/22 0024  NA 134* 130* 130* 132* 133* 132*  K 2.8* 4.8 3.5 3.8 3.8 3.7  CL 95* 99 98 98 101 98  CO2 25 23 21* 21* 22 22  ANIONGAP 14 8 11 13 10 12   GLUCOSE 85 153* 102* 89 89 77  BUN 8 6* 9 7* 8 9  CREATININE 0.76 0.83 0.75 0.75 0.65 0.72  AST 28  --  33 32 29 27  ALT 21  --  21 19 18 16   ALKPHOS 166*  --  226* 203* 186* 171*  BILITOT 0.7  --  0.5 0.5 0.7 0.7  ALBUMIN 3.0*  --  2.7* 2.9* 2.8* 2.7*  CRP  --   --  15.8* 14.8* 12.2*  --   PROCALCITON  --   --  0.50 0.16 0.12  --   LATICACIDVEN 0.8  --   --   --   --   --   TSH 6.055*  --   --   --   --   --   BNP  --   --  107.0* 119.3* 92.3 71.2  MG 2.1  --  1.9 1.8 1.7 1.8  CALCIUM 8.8* 8.1* 8.3* 8.7* 8.7* 8.6*     Total Time in preparing paper work, data evaluation and todays exam - 35 minutes  Signature  -    Susa Raring M.D on 10/13/2022 at 9:32 AM   -  To page go to [email or web address]

## 2022-10-13 NOTE — Plan of Care (Signed)

## 2022-10-13 NOTE — Progress Notes (Signed)
Pt instructed on pigtail drain. Pt was able to verbalize and demonstrate how to empty drain and also how to flush drain and the importance of record output.

## 2022-10-14 LAB — AEROBIC/ANAEROBIC CULTURE W GRAM STAIN (SURGICAL/DEEP WOUND): Culture: NO GROWTH

## 2022-10-14 LAB — CULTURE, BLOOD (ROUTINE X 2)
Culture: NO GROWTH
Culture: NO GROWTH
Special Requests: ADEQUATE
Special Requests: ADEQUATE

## 2022-10-15 ENCOUNTER — Other Ambulatory Visit: Payer: Self-pay | Admitting: Internal Medicine

## 2022-10-15 DIAGNOSIS — D733 Abscess of spleen: Secondary | ICD-10-CM

## 2022-10-29 ENCOUNTER — Telehealth: Payer: Self-pay

## 2022-10-29 ENCOUNTER — Ambulatory Visit
Admission: RE | Admit: 2022-10-29 | Discharge: 2022-10-29 | Disposition: A | Discharge status: Home / Self Care | Payer: Medicare HMO | Source: Ambulatory Visit | Attending: Internal Medicine | Admitting: Internal Medicine

## 2022-10-29 DIAGNOSIS — D733 Abscess of spleen: Secondary | ICD-10-CM

## 2022-10-29 MED ORDER — IOPAMIDOL (ISOVUE-300) INJECTION 61%
100.0000 mL | Freq: Once | INTRAVENOUS | Status: AC | PRN
  Administered 2022-10-29: 100 mL via INTRAVENOUS

## 2022-10-29 NOTE — Telephone Encounter (Signed)
Received call from [facility] with [facility] Admitting. Patient presented for imaging that was scheduled for [facility] at [facility]. Provided Lupita Leash with [facility] Theot[facility] Burrow address and phone number. She will provide this to the patient for her appointment at 1:40 PM today.   Sandie Ano, RN
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
## 2023-11-28 ENCOUNTER — Emergency Department (HOSPITAL_COMMUNITY)

## 2023-11-28 ENCOUNTER — Encounter (HOSPITAL_COMMUNITY): Payer: Self-pay | Admitting: Emergency Medicine

## 2023-11-28 ENCOUNTER — Other Ambulatory Visit: Payer: Self-pay

## 2023-11-28 ENCOUNTER — Inpatient Hospital Stay (HOSPITAL_COMMUNITY)
Admission: EM | Admit: 2023-11-28 | Discharge: 2023-12-07 | DRG: 519 | Disposition: A | Discharge status: Skilled Nursing Facility | Attending: Family Medicine | Admitting: Family Medicine

## 2023-11-28 DIAGNOSIS — D75839 Thrombocytosis, unspecified: Secondary | ICD-10-CM | POA: Diagnosis present

## 2023-11-28 DIAGNOSIS — Z79899 Other long term (current) drug therapy: Secondary | ICD-10-CM | POA: Diagnosis not present

## 2023-11-28 DIAGNOSIS — Z79890 Hormone replacement therapy (postmenopausal): Secondary | ICD-10-CM | POA: Diagnosis not present

## 2023-11-28 DIAGNOSIS — Z96641 Presence of right artificial hip joint: Secondary | ICD-10-CM | POA: Diagnosis present

## 2023-11-28 DIAGNOSIS — D751 Secondary polycythemia: Secondary | ICD-10-CM | POA: Diagnosis present

## 2023-11-28 DIAGNOSIS — R29898 Other symptoms and signs involving the musculoskeletal system: Secondary | ICD-10-CM | POA: Diagnosis not present

## 2023-11-28 DIAGNOSIS — M4801 Spinal stenosis, occipito-atlanto-axial region: Secondary | ICD-10-CM | POA: Diagnosis present

## 2023-11-28 DIAGNOSIS — I340 Nonrheumatic mitral (valve) insufficiency: Secondary | ICD-10-CM | POA: Diagnosis not present

## 2023-11-28 DIAGNOSIS — I10 Essential (primary) hypertension: Secondary | ICD-10-CM | POA: Diagnosis present

## 2023-11-28 DIAGNOSIS — M7138 Other bursal cyst, other site: Principal | ICD-10-CM | POA: Diagnosis present

## 2023-11-28 DIAGNOSIS — M4802 Spinal stenosis, cervical region: Secondary | ICD-10-CM | POA: Diagnosis present

## 2023-11-28 DIAGNOSIS — M5000 Cervical disc disorder with myelopathy, unspecified cervical region: Secondary | ICD-10-CM | POA: Diagnosis not present

## 2023-11-28 DIAGNOSIS — Z9181 History of falling: Secondary | ICD-10-CM

## 2023-11-28 DIAGNOSIS — G9389 Other specified disorders of brain: Secondary | ICD-10-CM | POA: Diagnosis present

## 2023-11-28 DIAGNOSIS — Z8249 Family history of ischemic heart disease and other diseases of the circulatory system: Secondary | ICD-10-CM | POA: Diagnosis not present

## 2023-11-28 DIAGNOSIS — Z66 Do not resuscitate: Secondary | ICD-10-CM | POA: Diagnosis present

## 2023-11-28 DIAGNOSIS — G992 Myelopathy in diseases classified elsewhere: Secondary | ICD-10-CM | POA: Diagnosis present

## 2023-11-28 DIAGNOSIS — R531 Weakness: Secondary | ICD-10-CM | POA: Diagnosis not present

## 2023-11-28 DIAGNOSIS — Z9841 Cataract extraction status, right eye: Secondary | ICD-10-CM | POA: Diagnosis not present

## 2023-11-28 DIAGNOSIS — D45 Polycythemia vera: Secondary | ICD-10-CM | POA: Diagnosis present

## 2023-11-28 DIAGNOSIS — Z885 Allergy status to narcotic agent status: Secondary | ICD-10-CM | POA: Diagnosis not present

## 2023-11-28 DIAGNOSIS — H409 Unspecified glaucoma: Secondary | ICD-10-CM | POA: Diagnosis present

## 2023-11-28 DIAGNOSIS — D72829 Elevated white blood cell count, unspecified: Secondary | ICD-10-CM | POA: Diagnosis present

## 2023-11-28 DIAGNOSIS — Z9842 Cataract extraction status, left eye: Secondary | ICD-10-CM | POA: Diagnosis not present

## 2023-11-28 DIAGNOSIS — R471 Dysarthria and anarthria: Secondary | ICD-10-CM | POA: Diagnosis not present

## 2023-11-28 DIAGNOSIS — M4804 Spinal stenosis, thoracic region: Secondary | ICD-10-CM | POA: Diagnosis present

## 2023-11-28 DIAGNOSIS — T4145XA Adverse effect of unspecified anesthetic, initial encounter: Secondary | ICD-10-CM | POA: Diagnosis not present

## 2023-11-28 DIAGNOSIS — Z981 Arthrodesis status: Secondary | ICD-10-CM

## 2023-11-28 DIAGNOSIS — G8194 Hemiplegia, unspecified affecting left nondominant side: Secondary | ICD-10-CM | POA: Diagnosis not present

## 2023-11-28 DIAGNOSIS — E876 Hypokalemia: Secondary | ICD-10-CM | POA: Diagnosis present

## 2023-11-28 DIAGNOSIS — G959 Disease of spinal cord, unspecified: Secondary | ICD-10-CM | POA: Diagnosis present

## 2023-11-28 DIAGNOSIS — E785 Hyperlipidemia, unspecified: Secondary | ICD-10-CM | POA: Diagnosis present

## 2023-11-28 DIAGNOSIS — E039 Hypothyroidism, unspecified: Secondary | ICD-10-CM | POA: Diagnosis present

## 2023-11-28 DIAGNOSIS — R29810 Facial weakness: Secondary | ICD-10-CM | POA: Diagnosis not present

## 2023-11-28 DIAGNOSIS — Z90710 Acquired absence of both cervix and uterus: Secondary | ICD-10-CM

## 2023-11-28 DIAGNOSIS — I639 Cerebral infarction, unspecified: Secondary | ICD-10-CM | POA: Diagnosis not present

## 2023-11-28 DIAGNOSIS — R5381 Other malaise: Secondary | ICD-10-CM | POA: Diagnosis present

## 2023-11-28 DIAGNOSIS — Z7982 Long term (current) use of aspirin: Secondary | ICD-10-CM | POA: Diagnosis not present

## 2023-11-28 LAB — COMPREHENSIVE METABOLIC PANEL WITH GFR
ALT: 25 U/L (ref 0–44)
AST: 42 U/L — ABNORMAL HIGH (ref 15–41)
Albumin: 4.3 g/dL (ref 3.5–5.0)
Alkaline Phosphatase: 119 U/L (ref 38–126)
Anion gap: 12 (ref 5–15)
BUN: 12 mg/dL (ref 8–23)
CO2: 22 mmol/L (ref 22–32)
Calcium: 9.3 mg/dL (ref 8.9–10.3)
Chloride: 103 mmol/L (ref 98–111)
Creatinine, Ser: 0.62 mg/dL (ref 0.44–1.00)
GFR, Estimated: >60 mL/min (ref >60)
Glucose, Bld: 108 mg/dL — ABNORMAL HIGH (ref 70–99)
Potassium: 3.6 mmol/L (ref 3.5–5.1)
Sodium: 137 mmol/L (ref 135–145)
Total Bilirubin: 0.8 mg/dL (ref 0.0–1.2)
Total Protein: 7.9 g/dL (ref 6.5–8.1)

## 2023-11-28 LAB — URINALYSIS, ROUTINE W REFLEX MICROSCOPIC
Bacteria, UA: NONE SEEN
Bilirubin Urine: NEGATIVE
Glucose, UA: NEGATIVE mg/dL
Hgb urine dipstick: NEGATIVE
Ketones, ur: NEGATIVE mg/dL
Leukocytes,Ua: NEGATIVE
Nitrite: NEGATIVE
Protein, ur: 30 mg/dL — AB
Specific Gravity, Urine: 1.016 (ref 1.005–1.030)
pH: 5 (ref 5.0–8.0)

## 2023-11-28 LAB — CBC
HCT: 37 % (ref 36.0–46.0)
Hemoglobin: 12.5 g/dL (ref 12.0–15.0)
MCH: 41.1 pg — ABNORMAL HIGH (ref 26.0–34.0)
MCHC: 33.8 g/dL (ref 30.0–36.0)
MCV: 121.7 fL — ABNORMAL HIGH (ref 80.0–100.0)
Platelets: 482 K/uL — ABNORMAL HIGH (ref 150–400)
RBC: 3.04 MIL/uL — ABNORMAL LOW (ref 3.87–5.11)
RDW: 26.5 % — ABNORMAL HIGH (ref 11.5–15.5)
WBC: 13.6 K/uL — ABNORMAL HIGH (ref 4.0–10.5)
nRBC: 0.5 % — ABNORMAL HIGH (ref 0.0–0.2)

## 2023-11-28 LAB — RESP PANEL BY RT-PCR (RSV, FLU A&B, COVID)  RVPGX2
Influenza A by PCR: NEGATIVE
Influenza B by PCR: NEGATIVE
Resp Syncytial Virus by PCR: NEGATIVE
SARS Coronavirus 2 by RT PCR: NEGATIVE

## 2023-11-28 LAB — TROPONIN I (HIGH SENSITIVITY)
Troponin I (High Sensitivity): 9 ng/L (ref <18)
Troponin I (High Sensitivity): 9 ng/L (ref <18)

## 2023-11-28 LAB — CBG MONITORING, ED: Glucose-Capillary: 99 mg/dL (ref 70–99)

## 2023-11-28 LAB — TSH: TSH: 0.425 u[IU]/mL (ref 0.350–4.500)

## 2023-11-28 MED ORDER — LEVOTHYROXINE SODIUM 75 MCG PO TABS
75.0000 ug | ORAL_TABLET | ORAL | Status: DC
  Administered 2023-11-30 – 2023-12-06 (×4): 75 ug via ORAL
  Filled 2023-11-28 (×4): qty 1

## 2023-11-28 MED ORDER — CHLORTHALIDONE 25 MG PO TABS
25.0000 mg | ORAL_TABLET | Freq: Every day | ORAL | Status: DC
Start: 2023-11-29 — End: 2023-11-29

## 2023-11-28 MED ORDER — ACETAMINOPHEN 650 MG RE SUPP: 650.0000 mg | Freq: Four times a day (QID) | RECTAL | Status: DC | PRN

## 2023-11-28 MED ORDER — OXYCODONE HCL 5 MG PO TABS
5.0000 mg | ORAL_TABLET | Freq: Four times a day (QID) | ORAL | Status: DC | PRN
  Administered 2023-11-28 – 2023-12-06 (×10): 5 mg via ORAL
  Filled 2023-11-28 (×11): qty 1

## 2023-11-28 MED ORDER — ACETAMINOPHEN 325 MG PO TABS
650.0000 mg | ORAL_TABLET | Freq: Four times a day (QID) | ORAL | Status: DC | PRN
  Administered 2023-11-28 – 2023-12-01 (×2): 650 mg via ORAL
  Filled 2023-11-28 (×2): qty 2

## 2023-11-28 MED ORDER — ONDANSETRON HCL 4 MG PO TABS: 4.0000 mg | ORAL_TABLET | Freq: Four times a day (QID) | ORAL | Status: DC | PRN

## 2023-11-28 MED ORDER — LORATADINE 10 MG PO TABS
10.0000 mg | ORAL_TABLET | Freq: Every day | ORAL | Status: DC
  Administered 2023-11-30 – 2023-12-07 (×7): 10 mg via ORAL
  Filled 2023-11-28 (×7): qty 1

## 2023-11-28 MED ORDER — CARVEDILOL 3.125 MG PO TABS: 3.1250 mg | ORAL_TABLET | Freq: Two times a day (BID) | ORAL | Status: DC

## 2023-11-28 MED ORDER — LATANOPROST 0.005 % OP SOLN: 1.0000 [drp] | Freq: Every day | OPHTHALMIC | Status: DC

## 2023-11-28 MED ORDER — SODIUM CHLORIDE 0.9 % IV SOLN: INTRAVENOUS | Status: AC

## 2023-11-28 MED ORDER — TRAZODONE HCL 50 MG PO TABS
25.0000 mg | ORAL_TABLET | Freq: Every evening | ORAL | Status: DC | PRN
  Administered 2023-11-28 – 2023-12-04 (×5): 25 mg via ORAL
  Filled 2023-11-28 (×5): qty 1

## 2023-11-28 MED ORDER — LEVOTHYROXINE SODIUM 50 MCG PO TABS
50.0000 ug | ORAL_TABLET | ORAL | Status: DC
  Administered 2023-11-29 – 2023-12-07 (×4): 50 ug via ORAL
  Filled 2023-11-28 (×5): qty 1

## 2023-11-28 MED ORDER — ONDANSETRON HCL 4 MG/2ML IJ SOLN
4.0000 mg | Freq: Four times a day (QID) | INTRAMUSCULAR | Status: DC | PRN
  Administered 2023-12-05 – 2023-12-06 (×2): 4 mg via INTRAVENOUS
  Filled 2023-11-28 (×2): qty 2

## 2023-11-28 MED ORDER — MAGNESIUM HYDROXIDE 400 MG/5ML PO SUSP: 30.0000 mL | Freq: Every day | ORAL | Status: DC | PRN

## 2023-11-28 MED ORDER — FOLIC ACID 1 MG PO TABS
1.0000 mg | ORAL_TABLET | Freq: Every day | ORAL | Status: DC
  Administered 2023-11-30 – 2023-12-07 (×7): 1 mg via ORAL
  Filled 2023-11-28 (×7): qty 1

## 2023-11-28 MED ORDER — HYDROXYUREA 500 MG PO CAPS: 500.0000 mg | ORAL_CAPSULE | Freq: Every day | ORAL | Status: DC

## 2023-11-28 MED ORDER — BRIMONIDINE TARTRATE 0.2 % OP SOLN
1.0000 [drp] | Freq: Every day | OPHTHALMIC | Status: DC
  Administered 2023-11-29 – 2023-12-07 (×7): 1 [drp] via OPHTHALMIC
  Filled 2023-11-28 (×2): qty 5

## 2023-11-28 NOTE — ED Notes (Signed)
 Pt is not currently in her room. She is radiology for imaging.

## 2023-11-28 NOTE — ED Notes (Signed)
 Pt assisted to the bathroom with family via wheelchair.

## 2023-11-28 NOTE — H&P (Incomplete)
 [place]   PATIENT NAME: Gabrielle Donovan    MR#:  [id number]  DATE OF BIRTH:  07/03/1945  DATE OF ADMISSION:  11/28/2023  PRIMARY CARE PHYSICIAN: Gabrielle Elspeth BRAVO, MD   Patient is coming from: Home  REQUESTING/REFERRING PHYSICIAN: Elnor Savant, DO  CHIEF COMPLAINT:   Chief Complaint  Patient presents with   Extremity Weakness    HISTORY OF PRESENT ILLNESS:  Gabrielle Donovan is a 78 y.o. African-American female with medical history significant for osteoarthritis, essential hypertension right sided low back pain and polycythemia, presented to the emergency room with acute onset of progressive left upper extremity weakness over the last 3 months and left lower extremity weakness over the last couple weeks.  The patient denied any paresthesias or other focal muscle weakness.  No dysphagia or dysarthria.  No headache or dizziness or blurred vision.  No tinnitus or vertigo.  No urinary or stool incontinence.  No fever or chills.  She denied chest pain or palpitations.  No cough or wheezing or dyspnea.  No falls or injuries.  ED Course: When she came to the ER, vital signs were within normal.  Labs revealed potassium 3.6 and AST was 42, with otherwise unremarkable CMP.  High sensitive troponin I was 9 and later at the same.  CBC showed leukocytosis 13.6 with thrombocytosis that is better than before.  TSH was 0.425.  Respiratory panel came back negative. EKG as reviewed by me :  EKG showed normal sinus rhythm at a rate of 88 with minimal voltage criteria for LVH and Q waves anteroseptally. Imaging: Portable chest x-ray showed no acute cardiopulmonary disease.  Lumbar spine CT without contrast revealed the following: 1. Similar L1 chronic compression fracture. 2. No evidence of acute fracture. Similar grade 1 anterolisthesis of L3 on L4 and L4 on L5. 3. Probably at least moderate and potentially severe canal and subarticular recess stenosis at L3-L4 and L4-L5. An MRI could better assess  the canal and foramina if clinically warranted.  Brain MRI without contrast revealed the following: 1. No acute findings. 2. Moderate for age chronic microvascular ischemic changes. 3. Mild parenchymal volume loss.  Cervical spine MRI without contrast revealed the following: 1. At C1-C2, bulky pannus along the posterior left paramidline dens results in severe canal stenosis and cord compression. Suspected mild T2 hyperintensity in the cord at this level, compatible with edema and/or myelomalacia. 2. At C2-C3, severe left foraminal stenosis. 3. At C3-C4, moderate to severe bilateral foraminal stenosis. 4. At C5-C6, moderate left and mild right foraminal stenosis. 5. At C6-C7, moderate right and mild left foraminal stenosis. 6. At C7-T1, mild to moderate bilateral foraminal stenosis and mild canal stenosis.  Contact was made with neurosurgery [facility] who recommended transfer to [facility] and the patient will be seen in AM. The patient will be admitted to a medical telemetry bed for further evaluation and management.   PAST MEDICAL HISTORY:   Past Medical History:  Diagnosis Date   Anemia    Arthritis    Cervical myelopathy (HCC)    Diverticulitis    Full dentures    GI bleed    Glaucoma    right eye   Headache    History of blood transfusion    Hypertension    Lumbago with sciatica, right side    treated with ESI   Polycythemia    Thrombocytosis    Wears glasses     PAST SURGICAL HISTORY:   Past Surgical History:  Procedure Laterality Date   ABDOMINAL HYSTERECTOMY     CATARACT EXTRACTION, BILATERAL     FRACTURE SURGERY     right wrist   POSTERIOR CERVICAL FUSION/FORAMINOTOMY N/A 01/18/2019   Procedure: Posterior decompression ring of Cervical one;  Surgeon: Colon Shove, MD;  [facility];  Service: Neurosurgery;  Laterality: N/A;   POSTERIOR CERVICAL LAMINECTOMY N/A 01/18/2019   Procedure: POSTERIOR CERVICAL WOUND EXPLORATION;  Surgeon: Colon Shove, MD;   Location: [facility] OR;  Service: Neurosurgery;  Laterality: N/A;   TOTAL HIP ARTHROPLASTY Right 07/31/2013   Procedure: RIGHT TOTAL HIP ARTHROPLASTY ANTERIOR APPROACH;  Surgeon: Donnice JONETTA Car, MD;  Location: [facility] ORS;  Service: Orthopedics;  Laterality: Right;   TUBAL LIGATION      SOCIAL HISTORY:   Social History   Tobacco Use   Smoking status: Never   Smokeless tobacco: Never  Substance Use Topics   Alcohol  use: No    FAMILY HISTORY:   Family History  Problem Relation Age of Onset   Hypertension Mother     DRUG ALLERGIES:   Allergies  Allergen Reactions   Codeine Nausea And Vomiting   Hydrocodone  Nausea And Vomiting   Other     REVIEW OF SYSTEMS:   ROS As per history of present illness. All pertinent systems were reviewed above. Constitutional, HEENT, cardiovascular, respiratory, GI, GU, musculoskeletal, neuro, psychiatric, endocrine, integumentary and hematologic systems were reviewed and are otherwise negative/unremarkable except for positive findings mentioned above in the HPI.   MEDICATIONS AT HOME:   Prior to Admission medications   Medication Sig Start Date End Date Taking? Authorizing Provider  brimonidine  (ALPHAGAN ) 0.2 % ophthalmic solution Place 1 drop into the right eye at bedtime.    [provider]  carvedilol  (COREG ) 3.125 MG tablet Take 1 tablet (3.125 mg total) by mouth 2 (two) times daily with a meal. 10/13/22   Singh, Prashant K, MD  cetirizine (ZYRTEC) 10 MG tablet Take 10 mg by mouth daily. 09/20/22   [provider]  chlorthalidone  (HYGROTON ) 25 MG tablet Take 25 mg by mouth daily.    [provider]  folic acid  (FOLVITE ) 1 MG tablet Take 1 mg by mouth daily.    [provider]  hydroxyurea  (HYDREA ) 500 MG capsule Take 500 mg by mouth in the morning, at noon, and at bedtime.    [provider]  latanoprost  (XALATAN ) 0.005 % ophthalmic solution Place 1 drop into the right eye at bedtime. 10/25/18   [provider]  oxyCODONE  (OXY IR/ROXICODONE ) 5 MG immediate release tablet Take 1 tablet (5 mg total) by mouth every 6 (six) hours as needed for severe pain. 10/13/22   Singh, Prashant K, MD  SYNTHROID  50 MCG tablet Take 50 mcg by mouth every other day. Alternating with 75mcg 08/31/21   [provider]  SYNTHROID  75 MCG tablet Take 75 mcg by mouth every other day. Alternating with 5mcg    [provider]      VITAL SIGNS:  Blood pressure (!) 126/58, pulse 77, temperature 98.1 F (36.7 C), resp. rate 17, height 5' 6 (1.676 m), weight 57.2 kg, SpO2 93%.  PHYSICAL EXAMINATION:  Physical Exam  GENERAL:  78 y.o.-year-old African American female patient lying in the bed with no acute distress.  EYES: Pupils equal, round, reactive to light and accommodation. No scleral icterus. Extraocular muscles intact.  HEENT: Head atraumatic, normocephalic. Oropharynx and nasopharynx clear.  NECK:  Supple, no jugular venous distention. No thyroid  enlargement, no tenderness.  LUNGS: Normal breath sounds bilaterally, no wheezing, rales,rhonchi or crepitation. No use of accessory muscles of respiration.  CARDIOVASCULAR: Regular rate and rhythm, S1, S2 normal. No murmurs, rubs, or gallops.  ABDOMEN: Soft, nondistended, nontender. Bowel sounds present. No organomegaly or mass.  EXTREMITIES: No pedal edema, cyanosis, or clubbing.  NEUROLOGIC: Cranial nerves II through XII are intact. Muscle strength 3/5 in the left upper and left lower extremity compared to 5/5 in the right upper and lower extremities. Sensation intact. Gait not checked.  PSYCHIATRIC: The patient is alert and oriented x 3.  Normal affect and good eye contact. SKIN: No obvious rash, lesion, or ulcer.   LABORATORY PANEL:   CBC Recent Labs  Lab 11/28/23 1650  WBC 13.6*  HGB 12.5  HCT 37.0  PLT 482*    ------------------------------------------------------------------------------------------------------------------  Chemistries  Recent Labs  Lab 11/28/23 1650  NA 137  K 3.6  CL 103  CO2 22  GLUCOSE 108*  BUN 12  CREATININE 0.62  CALCIUM 9.3  AST 42*  ALT 25  ALKPHOS 119  BILITOT 0.8   ------------------------------------------------------------------------------------------------------------------  Cardiac Enzymes No results for input(s): TROPONINI in the last 168 hours. ------------------------------------------------------------------------------------------------------------------  RADIOLOGY:  MR BRAIN WO CONTRAST Result Date: 11/28/2023 EXAM: MRI BRAIN WITHOUT CONTRAST 11/28/2023 07:40:36 PM TECHNIQUE: Multiplanar multisequence MRI of the head/brain was performed without the administration of intravenous contrast. COMPARISON: CT head 01/18/2019 and same day MRI cervical spine. CLINICAL HISTORY: LUE LLE weakness 1 month. FINDINGS: BRAIN AND VENTRICLES: No acute infarct. No intracranial hemorrhage. No mass. No midline shift. No hydrocephalus. The sella is unremarkable. Normal flow voids. T2/FLAIR hyperintensity in the periventricular and subcortical white matter with additional signal abnormality in the pons likely reflecting chronic microvascular ischemic changes. There is mild parenchymal volume loss. ORBITS: Bilateral lens replacement. SINUSES AND MASTOIDS: No acute abnormality. BONES AND SOFT TISSUES: Normal marrow signal. No acute soft tissue abnormality. Left forehead subcutaneous lipoma. Partially visualized abnormal soft tissue along the dorsal aspect of the dens, left with midline likely reflecting degenerative pannus with significant spinal canal narrowing, which is better evaluated on the same day MRI of the cervical spine. IMPRESSION: 1. No acute findings. 2. Moderate for age chronic microvascular ischemic changes. 3. Mild parenchymal volume loss. Electronically  signed by: Donnice Mania MD 11/28/2023 08:49 PM EDT RP Workstation: [id number]   MR Cervical Spine Wo Contrast Result Date: 11/28/2023 CLINICAL DATA:  Myelopathy, acute, cervical spine EXAM: MRI CERVICAL SPINE WITHOUT CONTRAST TECHNIQUE: Multiplanar, multisequence MR imaging of the cervical spine was performed. No intravenous contrast was administered. COMPARISON:  None Available. FINDINGS: Alignment: No substantial sagittal subluxation. Vertebrae: No fracture, evidence of discitis, or bone lesion. Cord: Suspected mild T2 hyperintensity in the cord at the craniocervical junction. Posterior Fossa, vertebral arteries, paraspinal tissues: Negative. Disc levels: C1-C2: Bulky pannus along the posterior left paramidline dens which results in severe canal stenosis and cord compression. C2-C3: Left greater than right facet and vertebral hypertrophy with resulting severe left foraminal stenosis. Patent canal and right foramen. C3-C4: Bilateral facet uncovertebral hypertrophy results in moderate to severe bilateral foraminal stenosis. Patent canal. C4-C5: Bilateral facet and uncovertebral hypertrophy with mild right greater than left foraminal stenosis. Patent canal. C5-C6: Posterior disc osteophyte complex with left greater than right facet and vertebral or she. Resulting moderate left and mild right foraminal stenosis. Patent canal. C6-C7: Posterior disc osteophyte complex with right greater left facet hypertrophy L horseshoe. Resulting moderate right and mild left foraminal stenosis. Patent canal. C7-T1: Bilateral facet uncovertebral hypertrophy. Resulting  mild to moderate bilateral foraminal stenosis. Mild canal stenosis. IMPRESSION: 1. At C1-C2, bulky pannus along the posterior left paramidline dens results in severe canal stenosis and cord compression. Suspected mild T2 hyperintensity in the cord at this level, compatible with edema and/or myelomalacia. 2. At C2-C3, severe left foraminal stenosis. 3. At C3-C4,  moderate to severe bilateral foraminal stenosis. 4. At C5-C6, moderate left and mild right foraminal stenosis. 5. At C6-C7, moderate right and mild left foraminal stenosis. 6. At C7-T1, mild to moderate bilateral foraminal stenosis and mild canal stenosis. Electronically Signed   By: Gilmore GORMAN Molt M.D.   On: 11/28/2023 20:32   CT Lumbar Spine Wo Contrast Result Date: 11/28/2023 CLINICAL DATA:  Myelopathy, acute, lumbar spine. EXAM: CT LUMBAR SPINE WITHOUT CONTRAST TECHNIQUE: Multidetector CT imaging of the lumbar spine was performed without intravenous contrast administration. Multiplanar CT image reconstructions were also generated. RADIATION DOSE REDUCTION: This exam was performed according to the departmental dose-optimization program which includes automated exposure control, adjustment of the mA and/or kV according to patient size and/or use of iterative reconstruction technique. COMPARISON:  CT abdomen/pelvis 10/29/2022. FINDINGS: Segmentation: 5 non rib-bearing lumbar vertebral bodies Alignment: Similar grade 1 anterolisthesis of L3 on L4 and L4 on L5. Vertebrae: Similar L1 chronic compression fracture. No evidence of acute fracture. Paraspinal and other soft tissues: Aorto bi-iliac calcific atherosclerosis. Disc levels: Probably at least moderate and potentially severe canal stenosis at L3-L4 and L4-L5. IMPRESSION: 1. Similar L1 chronic compression fracture. 2. No evidence of acute fracture. Similar grade 1 anterolisthesis of L3 on L4 and L4 on L5. 3. Probably at least moderate and potentially severe canal and subarticular recess stenosis at L3-L4 and L4-L5. An MRI could better assess the canal and foramina if clinically warranted. Electronically Signed   By: Gilmore GORMAN Molt M.D.   On: 11/28/2023 20:21   DG Chest Portable 1 View Result Date: 11/28/2023 CLINICAL DATA:  weak EXAM: PORTABLE CHEST - 1 VIEW COMPARISON:  10/28/2022 FINDINGS: No focal airspace consolidation, pleural effusion, or  pneumothorax. Borderline cardiomegaly. Aortic atherosclerosis. Osteopenia. No acute fracture or destructive lesions. Multilevel thoracic osteophytosis. IMPRESSION: No acute cardiopulmonary abnormality. Electronically Signed   By: Rogelia Myers M.D.   On: 11/28/2023 17:22      IMPRESSION AND PLAN:  Assessment and Plan: * Cervical myelopathy (HCC) - At C1-C2, bulky pannus along the posterior left paramidline dens results in severe canal stenosis and cord compression. Suspected mild T2 hyperintensity in the cord at this level, compatible with edema and/or myelomalacia. At C2-C3, severe left foraminal stenosis. - This is manifested by left upper and lower extremity weakness. - The patient be admitted to a medical monitored bed at [facility]. - Physical therapy consult will be obtained. - Pain management will be provided. - IV Decadron  will be given. - Neurosurgery consult will be obtained. - Dr. Alm Molt and Orlean Iha, PA-C were notified about the patient.  Hypothyroidism - We will continue Synthroid .  Essential hypertension - Will continue antihypertensive therapy.  Polycythemia vera (HCC) - Will continue momelotinib and hydroxyurea .   DVT prophylaxis: Lovenox.  Advanced Care Planning:  Code Status: The patient is DNR and DNI.  This was discussed with her. Family Communication:  The plan of care was discussed in details with the patient (and family). I answered all questions. The patient agreed to proceed with the above mentioned plan. Further management will depend upon hospital course. Disposition Plan: Back to previous home environment Consults called: Neurosurgery. All the records are reviewed and  case discussed with ED provider.  Status is: Inpatient  At the time of the admission, it appears that the appropriate admission status for this patient is inpatient.  This is judged to be reasonable and necessary in order to provide the required intensity of service to ensure  the patient's safety given the presenting symptoms, physical exam findings and initial radiographic and laboratory data in the context of comorbid conditions.  The patient requires inpatient status due to high intensity of service, high risk of further deterioration and high frequency of surveillance required.  I certify that at the time of admission, it is my clinical judgment that the patient will require inpatient hospital care extending more than 2 midnights.                            Dispo: The patient is from: Home              Anticipated d/c is to: Home              Patient currently is not medically stable to d/c.              Difficult to place patient: No  Madison DELENA Peaches M.D on 11/29/2023 at 3:57 AM  [facility]   From 7 PM-7 AM, contact night-coverage [email or web address]  CC: Primary care physician; Gabrielle Elspeth BRAVO, MD

## 2023-11-28 NOTE — ED Triage Notes (Signed)
 Pt to ed pov c/o bilateral leg weakness that started last week. Family member states pt fell 3 times yesterday. Pt states left knee hurts. Family concerned about strength in legs decreasing.

## 2023-11-28 NOTE — ED Provider Notes (Signed)
 [place] EMERGENCY DEPARTMENT AT [facility] Provider Note  CSN: [id number] Arrival date & time: 11/28/23 1431  Chief Complaint(s) Extremity Weakness  HPI Gabrielle Donovan is a 78 y.o. female with past medical history as below, significant for cervical myelopathy, diverticulitis, hypertension, polycythemia, who presents to the ED with complaint of weakness, falls  Patient arrives by POV with family complaint of leg weakness over 1-2 weeks.  Patient with multiple falls in the past 24 hours.  Patient reports cervical myelopathy, she had surgery few years ago and symptoms improved, she has been having worsening left upper extremity weakness over the past 3 months, progressively worsening.  Also having progressively worsening left lower extremity weakness over the past 2 weeks.  She has been using a walker, seems to be dragging her leg on the ground per family.  She fell 3 times in the past 24 hours, fell onto her buttock.  Denies any head injury.  No vision or hearing changes.  No numbness or tingling globally.  She denies any numbness or tingling, no paresthesias, no significant back pain or neck pain.  No fevers or chills, no abdominal pain, chest pain or dyspnea.  No recent diet or medication changes.  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  Past Medical History Past Medical History:  Diagnosis Date   Anemia    Arthritis    Cervical myelopathy (HCC)    Diverticulitis    Full dentures    GI bleed    Glaucoma    right eye   Headache    History of blood transfusion    Hypertension    Lumbago with sciatica, right side    treated with ESI   Polycythemia    Thrombocytosis    Wears glasses    Patient Active Problem List   Diagnosis Date Noted   Splenic abscess 10/09/2022   Sepsis (HCC) 10/09/2022   Hypokalemia 10/09/2022   Acute hyponatremia 10/09/2022   Polycythemia vera (HCC) 10/09/2022   Essential hypertension 10/09/2022   Acquired hypothyroidism 10/09/2022    Spondylosis, cervical, with myelopathy 01/18/2019   Expected blood loss anemia 08/01/2013   Overweight (BMI 25.0-29.9) 08/01/2013   S/P right THA, 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   Home Medication(s) Prior to Admission medications   Medication Sig Start Date End Date Taking? Authorizing Provider  brimonidine  (ALPHAGAN ) 0.2 % ophthalmic solution Place 1 drop into the right eye at bedtime.    [provider]  carvedilol  (COREG ) 3.125 MG tablet Take 1 tablet (3.125 mg total) by mouth 2 (two) times daily with a meal. 10/13/22   Singh, Prashant K, MD  cetirizine (ZYRTEC) 10 MG tablet Take 10 mg by mouth daily. 09/20/22   [provider]  chlorthalidone  (HYGROTON ) 25 MG tablet Take 25 mg by mouth daily.    [provider]  folic acid  (FOLVITE ) 1 MG tablet Take 1 mg by mouth daily.    [provider]  hydroxyurea  (HYDREA ) 500 MG capsule Take 500 mg by mouth in the morning, at noon, and at bedtime.    [provider]  latanoprost  (XALATAN ) 0.005 % ophthalmic solution Place 1 drop into the right eye at bedtime. 10/25/18   [provider]  oxyCODONE  (OXY IR/ROXICODONE ) 5 MG immediate release tablet Take 1 tablet (5 mg total) by mouth every 6 (six) hours as needed for severe pain. 10/13/22   Singh, Prashant K, MD  SYNTHROID  50 MCG tablet Take 50 mcg by mouth every other day. Alternating with 75mcg 08/31/21  [provider]  SYNTHROID  75 MCG tablet Take 75 mcg by mouth every other day. Alternating with 5mcg    [provider]                                                                                                                                    Past Surgical History Past Surgical History:  Procedure Laterality Date   ABDOMINAL HYSTERECTOMY     CATARACT EXTRACTION, BILATERAL     FRACTURE SURGERY     right wrist   POSTERIOR CERVICAL FUSION/FORAMINOTOMY N/A 01/18/2019   Procedure: Posterior decompression ring of Cervical one;  Surgeon: Colon Shove, MD;  Location[facility];  Service: Neurosurgery;  Laterality: N/A;   POSTERIOR CERVICAL LAMINECTOMY N/A 01/18/2019   Procedure: POSTERIOR CERVICAL WOUND EXPLORATION;  Surgeon: Colon Shove, MD;  Location: [facility] OR;  Service: Neurosurgery;  Laterality: N/A;   TOTAL HIP ARTHROPLASTY Right 07/31/2013   Procedure: RIGHT TOTAL HIP ARTHROPLASTY ANTERIOR APPROACH;  Surgeon: Donnice JONETTA Car, MD;  Location: [facility] ORS;  Service: Orthopedics;  Laterality: Right;   TUBAL LIGATION     Family History Family History  Problem Relation Age of Onset   Hypertension Mother     Social History Social History   Tobacco Use   Smoking status: Never   Smokeless tobacco: Never  Vaping Use   Vaping status: Never Used  Substance Use Topics   Alcohol  use: No   Drug use: No   Allergies Codeine, Hydrocodone , and Other  Review of Systems A thorough review of systems was obtained and all systems are negative except as noted in the HPI and PMH.   Physical Exam Vital Signs  I have reviewed the triage vital signs BP 136/66   Pulse 89   Temp 99.4 F (37.4 C)   Resp (!) 23   Ht 5' 6 (1.676 m)   Wt 57.2 kg   SpO2 96%   BMI 20.34 kg/m  Physical Exam Vitals and nursing note reviewed.  Constitutional:      General: She is not in acute distress.    Appearance: Normal appearance. She is well-developed. She is not ill-appearing.  HENT:     Head: Normocephalic and atraumatic.     Right Ear: External ear normal.     Left Ear: External ear normal.     Nose: Nose normal.     Mouth/Throat:     Mouth: Mucous membranes are moist.  Eyes:     General: No scleral icterus.       Right eye: No discharge.        Left eye: No discharge.     Extraocular Movements: Extraocular movements intact.     Pupils: Pupils are equal, round, and reactive to light.  Cardiovascular:     Rate and Rhythm: Normal rate.  Pulmonary:     Effort: Pulmonary effort is normal. No respiratory distress.     Breath  sounds: No stridor.   Abdominal:     General: Abdomen is flat. There is no distension.     Palpations: Abdomen is soft.     Tenderness: There is no abdominal tenderness. There is no guarding.  Musculoskeletal:        General: No deformity.     Cervical back: Full passive range of motion without pain. No rigidity. No spinous process tenderness.     Comments: No midline spinous process tenderness to palpation or percussion, no crepitus or step-off.    Skin:    General: Skin is warm and dry.     Coloration: Skin is not cyanotic, jaundiced or pale.  Neurological:     Mental Status: She is alert and oriented to person, place, and time.     GCS: GCS eye subscore is 4. GCS verbal subscore is 5. GCS motor subscore is 6.     Cranial Nerves: Cranial nerves 2-12 are intact. No dysarthria or facial asymmetry.     Sensory: Sensation is intact. No sensory deficit.     Motor: Weakness present. No tremor.     Coordination: Coordination abnormal.     Comments: 5/5 strength RLE RUE 4/5 strength LLE LUE Slight pronator drift LUE Difficulty w/ coordination testing LUE   Psychiatric:        Speech: Speech normal.        Behavior: Behavior normal. Behavior is cooperative.     ED Results and Treatments Labs (all labs ordered are listed, but only abnormal results are displayed) Labs Reviewed  COMPREHENSIVE METABOLIC PANEL WITH GFR - Abnormal; Notable for the following components:      Result Value   Glucose, Bld 108 (*)    AST 42 (*)    All other components within normal limits  CBC - Abnormal; Notable for the following components:   WBC 13.6 (*)    RBC 3.04 (*)    MCV 121.7 (*)    MCH 41.1 (*)    RDW 26.5 (*)    Platelets 482 (*)    nRBC 0.5 (*)    All other components within normal limits  URINALYSIS, ROUTINE W REFLEX MICROSCOPIC - Abnormal; Notable for the following components:   Protein, ur 30 (*)    All other components within normal limits  RESP PANEL BY RT-PCR (RSV, FLU A&B, COVID)  RVPGX2  TSH  T4,  FREE  CBG MONITORING, ED  TROPONIN I (HIGH SENSITIVITY)  TROPONIN I (HIGH SENSITIVITY)                                                                                                                          Radiology MR BRAIN WO CONTRAST Result Date: 11/28/2023 EXAM: MRI BRAIN WITHOUT CONTRAST 11/28/2023 07:40:36 PM TECHNIQUE: Multiplanar multisequence MRI of the head/brain was performed without the administration of intravenous contrast. COMPARISON: CT head 01/18/2019 and same day MRI cervical spine. CLINICAL HISTORY: LUE LLE weakness 1 month. FINDINGS: BRAIN AND VENTRICLES: No acute infarct.  No intracranial hemorrhage. No mass. No midline shift. No hydrocephalus. The sella is unremarkable. Normal flow voids. T2/FLAIR hyperintensity in the periventricular and subcortical white matter with additional signal abnormality in the pons likely reflecting chronic microvascular ischemic changes. There is mild parenchymal volume loss. ORBITS: Bilateral lens replacement. SINUSES AND MASTOIDS: No acute abnormality. BONES AND SOFT TISSUES: Normal marrow signal. No acute soft tissue abnormality. Left forehead subcutaneous lipoma. Partially visualized abnormal soft tissue along the dorsal aspect of the dens, left with midline likely reflecting degenerative pannus with significant spinal canal narrowing, which is better evaluated on the same day MRI of the cervical spine. IMPRESSION: 1. No acute findings. 2. Moderate for age chronic microvascular ischemic changes. 3. Mild parenchymal volume loss. Electronically signed by: Donnice Mania MD 11/28/2023 08:49 PM EDT RP Workstation: [id number]   MR Cervical Spine Wo Contrast Result Date: 11/28/2023 CLINICAL DATA:  Myelopathy, acute, cervical spine EXAM: MRI CERVICAL SPINE WITHOUT CONTRAST TECHNIQUE: Multiplanar, multisequence MR imaging of the cervical spine was performed. No intravenous contrast was administered. COMPARISON:  None Available. FINDINGS: Alignment: No  substantial sagittal subluxation. Vertebrae: No fracture, evidence of discitis, or bone lesion. Cord: Suspected mild T2 hyperintensity in the cord at the craniocervical junction. Posterior Fossa, vertebral arteries, paraspinal tissues: Negative. Disc levels: C1-C2: Bulky pannus along the posterior left paramidline dens which results in severe canal stenosis and cord compression. C2-C3: Left greater than right facet and vertebral hypertrophy with resulting severe left foraminal stenosis. Patent canal and right foramen. C3-C4: Bilateral facet uncovertebral hypertrophy results in moderate to severe bilateral foraminal stenosis. Patent canal. C4-C5: Bilateral facet and uncovertebral hypertrophy with mild right greater than left foraminal stenosis. Patent canal. C5-C6: Posterior disc osteophyte complex with left greater than right facet and vertebral or she. Resulting moderate left and mild right foraminal stenosis. Patent canal. C6-C7: Posterior disc osteophyte complex with right greater left facet hypertrophy L horseshoe. Resulting moderate right and mild left foraminal stenosis. Patent canal. C7-T1: Bilateral facet uncovertebral hypertrophy. Resulting mild to moderate bilateral foraminal stenosis. Mild canal stenosis. IMPRESSION: 1. At C1-C2, bulky pannus along the posterior left paramidline dens results in severe canal stenosis and cord compression. Suspected mild T2 hyperintensity in the cord at this level, compatible with edema and/or myelomalacia. 2. At C2-C3, severe left foraminal stenosis. 3. At C3-C4, moderate to severe bilateral foraminal stenosis. 4. At C5-C6, moderate left and mild right foraminal stenosis. 5. At C6-C7, moderate right and mild left foraminal stenosis. 6. At C7-T1, mild to moderate bilateral foraminal stenosis and mild canal stenosis. Electronically Signed   By: Gilmore GORMAN Molt M.D.   On: 11/28/2023 20:32   CT Lumbar Spine Wo Contrast Result Date: 11/28/2023 CLINICAL DATA:  Myelopathy,  acute, lumbar spine. EXAM: CT LUMBAR SPINE WITHOUT CONTRAST TECHNIQUE: Multidetector CT imaging of the lumbar spine was performed without intravenous contrast administration. Multiplanar CT image reconstructions were also generated. RADIATION DOSE REDUCTION: This exam was performed according to the departmental dose-optimization program which includes automated exposure control, adjustment of the mA and/or kV according to patient size and/or use of iterative reconstruction technique. COMPARISON:  CT abdomen/pelvis 10/29/2022. FINDINGS: Segmentation: 5 non rib-bearing lumbar vertebral bodies Alignment: Similar grade 1 anterolisthesis of L3 on L4 and L4 on L5. Vertebrae: Similar L1 chronic compression fracture. No evidence of acute fracture. Paraspinal and other soft tissues: Aorto bi-iliac calcific atherosclerosis. Disc levels: Probably at least moderate and potentially severe canal stenosis at L3-L4 and L4-L5. IMPRESSION: 1. Similar L1 chronic compression fracture. 2. No evidence of  acute fracture. Similar grade 1 anterolisthesis of L3 on L4 and L4 on L5. 3. Probably at least moderate and potentially severe canal and subarticular recess stenosis at L3-L4 and L4-L5. An MRI could better assess the canal and foramina if clinically warranted. Electronically Signed   By: Gilmore GORMAN Molt M.D.   On: 11/28/2023 20:21   DG Chest Portable 1 View Result Date: 11/28/2023 CLINICAL DATA:  weak EXAM: PORTABLE CHEST - 1 VIEW COMPARISON:  10/28/2022 FINDINGS: No focal airspace consolidation, pleural effusion, or pneumothorax. Borderline cardiomegaly. Aortic atherosclerosis. Osteopenia. No acute fracture or destructive lesions. Multilevel thoracic osteophytosis. IMPRESSION: No acute cardiopulmonary abnormality. Electronically Signed   By: Rogelia Myers M.D.   On: 11/28/2023 17:22    Pertinent labs & imaging results that were available during my care of the patient were reviewed by me and considered in my medical decision  making (see MDM for details).  Medications Ordered in ED Medications - No data to display                                                                                                                                   Procedures Procedures  (including critical care time)  Medical Decision Making / ED Course    Medical Decision Making:    Leighton LISE PINCUS is a 78 y.o. female Everlyn MIKIA DELALUZ is a 78 y.o. female with past medical history as below, significant for cervical myelopathy, diverticulitis, hypertension, polycythemia, who presents to the ED with complaint of weakness, falls. The complaint involves an extensive differential diagnosis and also carries with it a high risk of complications and morbidity.  Serious etiology was considered. Ddx includes but is not limited to: CVA, myelopathy, infection, electrolyte derangement, MSK injury, etc.  Complete initial physical exam performed, notably the patient was in NAD.    Reviewed and confirmed nursing documentation for past medical history, family history, social history.  Vital signs reviewed.     Clinical Course as of 11/28/23 2239  Mon Nov 28, 2023  1648 EKG reviewed, ?LVH ?LBBB, spoke w/ Dr Wonda, no STEMI [SG]    Clinical Course User Index [SG] Elnor Savant A, DO     Left upper and lower extremity weakness Spinal stenosis > - Dr Colon posterior decompression of ring of C1 01/2019 - Patient here with progressive left upper and left lower extremity weakness.  No numbness or tingling.  No other neurologic abnormalities - objective weakness noted to LUE LLE on exam - Labs here stable, MRI reviewed; spoke with neurosurgery who recommends admission at [facility] and neurosurgery evaluation in the morning. - Recommend hospitalist service to facilitate - Patient and family are agreeable to care plan - admit [facility]               Additional history obtained: -Additional history obtained from family -External records  from outside source obtained and reviewed including: Chart review including previous notes, labs,  imaging, consultation notes including  Prior neurosurgery documentation, home medications, prior labs and imaging   Lab Tests: -I ordered, reviewed, and interpreted labs.   The pertinent results include:   Labs Reviewed  COMPREHENSIVE METABOLIC PANEL WITH GFR - Abnormal; Notable for the following components:      Result Value   Glucose, Bld 108 (*)    AST 42 (*)    All other components within normal limits  CBC - Abnormal; Notable for the following components:   WBC 13.6 (*)    RBC 3.04 (*)    MCV 121.7 (*)    MCH 41.1 (*)    RDW 26.5 (*)    Platelets 482 (*)    nRBC 0.5 (*)    All other components within normal limits  URINALYSIS, ROUTINE W REFLEX MICROSCOPIC - Abnormal; Notable for the following components:   Protein, ur 30 (*)    All other components within normal limits  RESP PANEL BY RT-PCR (RSV, FLU A&B, COVID)  RVPGX2  TSH  T4, FREE  CBG MONITORING, ED  TROPONIN I (HIGH SENSITIVITY)  TROPONIN I (HIGH SENSITIVITY)    Notable for labs stable  EKG   EKG Interpretation Date/Time:  Monday November 28 2023 16:14:06 EDT Ventricular Rate:  88 PR Interval:  202 QRS Duration:  118 QT Interval:  386 QTC Calculation: 467 R Axis:   5  Text Interpretation: Normal sinus rhythm Minimal voltage criteria for LVH, may be normal variant ( Cornell product ) Anteroseptal infarct , age undetermined Abnormal ECG When compared with ECG of 09-Oct-2022 07:13, Premature supraventricular complexes are no longer Present QRS duration has increased no stemi Confirmed by Elnor Savant ([id number]) on 11/28/2023 4:48:43 PM         Imaging Studies ordered: I ordered imaging studies including MRI brain/cspine, ct L, CXR I independently visualized the following imaging with scope of interpretation limited to determining acute life threatening conditions related to emergency care; findings noted above I  agree with the radiologist interpretation If any imaging was obtained with contrast I closely monitored patient for any possible adverse reaction a/w contrast administration in the emergency department   Medicines ordered and prescription drug management: No orders of the defined types were placed in this encounter.   -I have reviewed the patients home medicines and have made adjustments as needed   Consultations Obtained: I requested consultation with the NSGY,  and discussed lab and imaging findings as well as pertinent plan - they recommend: admit > send to [facility]   Cardiac Monitoring: Continuous pulse oximetry interpreted by myself, 98% on RA.    Social Determinants of Health:  Diagnosis or treatment significantly limited by social determinants of health: na   Reevaluation: After the interventions noted above, I reevaluated the patient and found that they have stayed the same  Co morbidities that complicate the patient evaluation  Past Medical History:  Diagnosis Date   Anemia    Arthritis    Cervical myelopathy (HCC)    Diverticulitis    Full dentures    GI bleed    Glaucoma    right eye   Headache    History of blood transfusion    Hypertension    Lumbago with sciatica, right side    treated with ESI   Polycythemia    Thrombocytosis    Wears glasses       Dispostion: Disposition decision including need for hospitalization was considered, and patient admitted to the hospital.    Final Clinical  Impression(s) / ED Diagnoses Final diagnoses:  Left arm weakness  Left leg weakness  Cervical myelopathy (HCC)        Elnor Jayson LABOR, DO 11/28/23 2239

## 2023-11-29 ENCOUNTER — Inpatient Hospital Stay (HOSPITAL_COMMUNITY)

## 2023-11-29 DIAGNOSIS — R29898 Other symptoms and signs involving the musculoskeletal system: Secondary | ICD-10-CM

## 2023-11-29 DIAGNOSIS — D45 Polycythemia vera: Secondary | ICD-10-CM | POA: Diagnosis not present

## 2023-11-29 DIAGNOSIS — E039 Hypothyroidism, unspecified: Secondary | ICD-10-CM | POA: Insufficient documentation

## 2023-11-29 DIAGNOSIS — I10 Essential (primary) hypertension: Secondary | ICD-10-CM | POA: Diagnosis not present

## 2023-11-29 DIAGNOSIS — G959 Disease of spinal cord, unspecified: Secondary | ICD-10-CM | POA: Diagnosis not present

## 2023-11-29 LAB — CBC
HCT: 32.6 % — ABNORMAL LOW (ref 36.0–46.0)
Hemoglobin: 11 g/dL — ABNORMAL LOW (ref 12.0–15.0)
MCH: 39.9 pg — ABNORMAL HIGH (ref 26.0–34.0)
MCHC: 33.7 g/dL (ref 30.0–36.0)
MCV: 118.1 fL — ABNORMAL HIGH (ref 80.0–100.0)
Platelets: 429 K/uL — AB (ref 150–400)
RBC: 2.76 MIL/uL — ABNORMAL LOW (ref 3.87–5.11)
RDW: 26 % — ABNORMAL HIGH (ref 11.5–15.5)
WBC: 9.2 K/uL (ref 4.0–10.5)
nRBC: 0.7 % — AB (ref 0.0–0.2)

## 2023-11-29 LAB — BASIC METABOLIC PANEL WITH GFR
Anion gap: 8 (ref 5–15)
BUN: 10 mg/dL (ref 8–23)
CO2: 22 mmol/L (ref 22–32)
Calcium: 8.7 mg/dL — ABNORMAL LOW (ref 8.9–10.3)
Chloride: 108 mmol/L (ref 98–111)
Creatinine, Ser: 0.62 mg/dL (ref 0.44–1.00)
GFR, Estimated: >60 mL/min (ref >60)
Glucose, Bld: 82 mg/dL (ref 70–99)
Potassium: 3.4 mmol/L — ABNORMAL LOW (ref 3.5–5.1)
Sodium: 138 mmol/L (ref 135–145)

## 2023-11-29 LAB — T4, FREE: Free T4: 1.14 ng/dL — ABNORMAL HIGH (ref 0.61–1.12)

## 2023-11-29 MED ORDER — CARVEDILOL 3.125 MG PO TABS
3.1250 mg | ORAL_TABLET | Freq: Two times a day (BID) | ORAL | Status: DC
Start: 2023-11-29 — End: 2023-12-05
  Administered 2023-11-29 – 2023-12-05 (×13): 3.125 mg via ORAL
  Filled 2023-11-29 (×12): qty 1

## 2023-11-29 MED ORDER — HYDROXYUREA 500 MG PO CAPS
500.0000 mg | ORAL_CAPSULE | Freq: Four times a day (QID) | ORAL | Status: DC
  Administered 2023-11-29 – 2023-12-07 (×29): 500 mg via ORAL
  Filled 2023-11-29 (×37): qty 1

## 2023-11-29 NOTE — ED Notes (Signed)
 Report given to [place], [place]

## 2023-11-29 NOTE — Plan of Care (Signed)
  Problem: Education: Goal: Knowledge of General Education information will improve Description: Including pain rating scale, medication(s)/side effects and non-pharmacologic comfort measures Outcome: Progressing   Problem: Clinical Measurements: Goal: Will remain free from infection Outcome: Progressing Goal: Respiratory complications will improve Outcome: Progressing   Problem: Skin Integrity: Goal: Risk for impaired skin integrity will decrease Outcome: Progressing   Problem: Clinical Measurements: Goal: Diagnostic test results will improve Outcome: Not Progressing   Problem: Activity: Goal: Risk for activity intolerance will decrease Outcome: Not Progressing

## 2023-11-29 NOTE — Assessment & Plan Note (Signed)
-   Will continue antihypertensive therapy.

## 2023-11-29 NOTE — Consult Note (Signed)
 Reason for Consult: Left-sided weakness Referring Physician: Dr. Drusilla Rivet IZZAH PASQUA is an 78 y.o. female.  HPI: Patient is an 78 year old ambidextrous female who I have treated in the past.  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  Patient did well until about 3 or 4 months ago when she started developing some slight weakness in the left lower extremity she started walking with use of a cane and more recently has required a walker to maintain her balance she notes that her left leg feels weak and then a few months ago also she noted that her left arm became weak she had a hard time lifting it up overhead yesterday when things seem to worsen she was seen in the emergency department where an MRI of the brain was performed and the craniocervical junction was evaluated.  The patient now has an even bigger pannus with a focal extension compressing the left side of the cord at the craniocervical junction.  This extends into the region of the brainstem.  She has not noted any difficulties with swallowing.  Past Medical History:  Diagnosis Date   Anemia    Arthritis    Cervical myelopathy (HCC)    Diverticulitis    Full dentures    GI bleed    Glaucoma    right eye   Headache    History of blood transfusion    Hypertension    Lumbago with sciatica, right side    treated with ESI   Polycythemia    Thrombocytosis    Wears glasses     Past Surgical History:  Procedure Laterality Date   ABDOMINAL HYSTERECTOMY     CATARACT EXTRACTION, BILATERAL     FRACTURE SURGERY     right wrist   POSTERIOR CERVICAL FUSION/FORAMINOTOMY N/A 01/18/2019   Procedure: Posterior decompression ring of Cervical one;  Surgeon: Colon Shove, MD;  Location: [facility] OR;  Service: Neurosurgery;  Laterality: N/A;   POSTERIOR CERVICAL LAMINECTOMY N/A 01/18/2019   Procedure:  POSTERIOR CERVICAL WOUND EXPLORATION;  Surgeon: Colon Shove, MD;  Location: [facility] OR;  Service: Neurosurgery;  Laterality: N/A;   TOTAL HIP ARTHROPLASTY Right 07/31/2013   Procedure: RIGHT TOTAL HIP ARTHROPLASTY ANTERIOR APPROACH;  Surgeon: Donnice JONETTA Car, MD;  Location: [facility];  Service: Orthopedics;  Laterality: Right;   TUBAL LIGATION      Family History  Problem Relation Age of Onset   Hypertension Mother     Social History:  reports that she has never smoked. She has never used smokeless tobacco. She reports that she does not drink alcohol  and does not use drugs.  Allergies:  Allergies  Allergen Reactions   Codeine Nausea And Vomiting   Hydrocodone  Nausea And Vomiting    Medications: I have not reviewed the patient's medication  Results for orders placed or performed during the hospital encounter of 11/28/23 (from the past 48 hours)  Urinalysis, Routine w reflex microscopic -Urine, Clean Catch     Status: Abnormal   Collection Time: 11/28/23  4:11 PM  Result Value Ref Range   Color, Urine YELLOW YELLOW   APPearance CLEAR CLEAR   Specific Gravity, Urine 1.016 1.005 - 1.030   pH 5.0 5.0 - 8.0   Glucose, UA NEGATIVE NEGATIVE mg/dL   Hgb urine dipstick NEGATIVE NEGATIVE   Bilirubin Urine NEGATIVE NEGATIVE   Ketones, ur NEGATIVE  NEGATIVE mg/dL   Protein, ur 30 (A) NEGATIVE mg/dL   Nitrite NEGATIVE NEGATIVE   Leukocytes,Ua NEGATIVE NEGATIVE   RBC / HPF 0-5 0 - 5 RBC/hpf   WBC, UA 0-5 0 - 5 WBC/hpf   Bacteria, UA NONE SEEN NONE SEEN   Squamous Epithelial / HPF 0-5 0 - 5 /HPF    Comment: Performed at [facility], [address], [place], [place] [address]  CBG monitoring, ED     Status: None   Collection Time: 11/28/23  4:16 PM  Result Value Ref Range   Glucose-Capillary 99 70 - 99 mg/dL    Comment: Glucose reference range applies only to samples taken after fasting for at least 8 hours.  Resp panel by RT-PCR (RSV, Flu A&B, Covid) Anterior Nasal Swab     Status: None    Collection Time: 11/28/23  4:48 PM   Specimen: Anterior Nasal Swab  Result Value Ref Range   SARS Coronavirus 2 by RT PCR NEGATIVE NEGATIVE    Comment: (NOTE) SARS-CoV-2 target nucleic acids are NOT DETECTED.  The SARS-CoV-2 RNA is generally detectable in upper respiratory specimens during the acute phase of infection. The lowest concentration of SARS-CoV-2 viral copies this assay can detect is 138 copies/mL. A negative result does not preclude SARS-Cov-2 infection and should not be used as the sole basis for treatment or other patient management decisions. A negative result may occur with  improper specimen collection/handling, submission of specimen other than nasopharyngeal swab, presence of viral mutation(s) within the areas targeted by this assay, and inadequate number of viral copies(<138 copies/mL). A negative result must be combined with clinical observations, patient history, and epidemiological information. The expected result is Negative.  Fact Sheet for Patients:  BloggerCourse.com  Fact Sheet for Healthcare Providers:  SeriousBroker.it  This test is no t yet approved or cleared by the [place]  FDA and  has been authorized for detection and/or diagnosis of SARS-CoV-2 by FDA under an Emergency Use Authorization (EUA). This EUA will remain  in effect (meaning this test can be used) for the duration of the COVID-19 declaration under Section 564(b)(1) of the Act, 21 U.S.C.section 360bbb-3(b)(1), unless the authorization is terminated  or revoked sooner.       Influenza A by PCR NEGATIVE NEGATIVE   Influenza B by PCR NEGATIVE NEGATIVE    Comment: (NOTE) The Xpert Xpress SARS-CoV-2/FLU/RSV plus assay is intended as an aid in the diagnosis of influenza from Nasopharyngeal swab specimens and should not be used as a sole basis for treatment. Nasal washings and aspirates are unacceptable for Xpert Xpress  SARS-CoV-2/FLU/RSV testing.  Fact Sheet for Patients: BloggerCourse.com  Fact Sheet for Healthcare Providers: SeriousBroker.it  This test is not yet approved or cleared by the [place]  FDA and has been authorized for detection and/or diagnosis of SARS-CoV-2 by FDA under an Emergency Use Authorization (EUA). This EUA will remain in effect (meaning this test can be used) for the duration of the COVID-19 declaration under Section 564(b)(1) of the Act, 21 U.S.C. section 360bbb-3(b)(1), unless the authorization is terminated or revoked.     Resp Syncytial Virus by PCR NEGATIVE NEGATIVE    Comment: (NOTE) Fact Sheet for Patients: BloggerCourse.com  Fact Sheet for Healthcare Providers: SeriousBroker.it  This test is not yet approved or cleared by the [place]  FDA and has been authorized for detection and/or diagnosis of SARS-CoV-2 by FDA under an Emergency Use Authorization (EUA). This EUA will remain in effect (meaning this test can be used)  for the duration of the COVID-19 declaration under Section 564(b)(1) of the Act, 21 U.S.C. section 360bbb-3(b)(1), unless the authorization is terminated or revoked.  Performed at [facility], [address], [place], [place] [address]   Comprehensive metabolic panel     Status: Abnormal   Collection Time: 11/28/23  4:50 PM  Result Value Ref Range   Sodium 137 135 - 145 mmol/L   Potassium 3.6 3.5 - 5.1 mmol/L   Chloride 103 98 - 111 mmol/L   CO2 22 22 - 32 mmol/L   Glucose, Bld 108 (H) 70 - 99 mg/dL    Comment: Glucose reference range applies only to samples taken after fasting for at least 8 hours.   BUN 12 8 - 23 mg/dL   Creatinine, Ser 9.37 0.44 - 1.00 mg/dL   Calcium 9.3 8.9 - 89.6 mg/dL   Total Protein 7.9 6.5 - 8.1 g/dL   Albumin 4.3 3.5 - 5.0 g/dL   AST 42 (H) 15 - 41 U/L   ALT 25 0 - 44 U/L   Alkaline Phosphatase 119 38  - 126 U/L   Total Bilirubin 0.8 0.0 - 1.2 mg/dL   GFR, Estimated >39 >39 mL/min    Comment: (NOTE) Calculated using the CKD-EPI Creatinine Equation (2021)    Anion gap 12 5 - 15    Comment: Performed at [facility], [address] [place], [place] [address]  CBC     Status: Abnormal   Collection Time: 11/28/23  4:50 PM  Result Value Ref Range   WBC 13.6 (H) 4.0 - 10.5 K/uL    Comment: WHITE COUNT CONFIRMED ON SMEAR REPEATED TO VERIFY    RBC 3.04 (L) 3.87 - 5.11 MIL/uL   Hemoglobin 12.5 12.0 - 15.0 g/dL   HCT 62.9 63.9 - 53.9 %   MCV 121.7 (H) 80.0 - 100.0 fL   MCH 41.1 (H) 26.0 - 34.0 pg   MCHC 33.8 30.0 - 36.0 g/dL   RDW 73.4 (H) 88.4 - 84.4 %   Platelets 482 (H) 150 - 400 K/uL    Comment: PLATELET COUNT CONFIRMED BY SMEAR REPEATED TO VERIFY SPECIMEN CHECKED FOR CLOTS    nRBC 0.5 (H) 0.0 - 0.2 %    Comment: Performed at [facility], [address], [place], [place] [address]  Troponin I (High Sensitivity)     Status: None   Collection Time: 11/28/23  4:50 PM  Result Value Ref Range   Troponin I (High Sensitivity) 9 <18 ng/L    Comment: (NOTE) Elevated high sensitivity troponin I (hsTnI) values and significant  changes across serial measurements may suggest ACS but many other  chronic and acute conditions are known to elevate hsTnI results.  Refer to the Links section for chest pain algorithms and additional  guidance. Performed at [facility], [address] [place], [place] [address]   TSH     Status: None   Collection Time: 11/28/23  4:50 PM  Result Value Ref Range   TSH 0.425 0.350 - 4.500 uIU/mL    Comment: Performed by a 3rd Generation assay with a functional sensitivity of <=0.01 uIU/mL. Performed at [facility], [address] [place], [place] [address]   T4, free     Status: Abnormal   Collection Time: 11/28/23  6:01 PM  Result Value Ref Range   Free T4 1.14 (H) 0.61 - 1.12 ng/dL    Comment: (NOTE) Biotin ingestion may interfere with free T4 tests.  If the results are inconsistent with  the TSH level, previous test results, or the clinical presentation, then consider biotin interference. If needed, order repeat testing after stopping biotin. Performed at [facility], [address] [place], [place] [address]   Troponin I (High Sensitivity)     Status: None   Collection Time: 11/28/23  8:14 PM  Result Value Ref Range   Troponin I (High Sensitivity) 9 <18 ng/L    Comment: (NOTE) Elevated high sensitivity troponin I (hsTnI) values and significant  changes across serial measurements may suggest ACS but many other  chronic and acute conditions are known to elevate hsTnI results.  Refer to the Links section for chest pain algorithms and additional  guidance. Performed at [facility], [address], [place], [place] [address]   Basic metabolic panel with GFR     Status: Abnormal   Collection Time: 11/29/23  6:50 AM  Result Value Ref Range   Sodium 138 135 - 145 mmol/L   Potassium 3.4 (L) 3.5 - 5.1 mmol/L   Chloride 108 98 - 111 mmol/L   CO2 22 22 - 32 mmol/L   Glucose, Bld 82 70 - 99 mg/dL    Comment: Glucose reference range applies only to samples taken after fasting for at least 8 hours.   BUN 10 8 - 23 mg/dL   Creatinine, Ser 9.37 0.44 - 1.00 mg/dL   Calcium 8.7 (L) 8.9 - 10.3 mg/dL   GFR, Estimated >39 >39 mL/min    Comment: (NOTE) Calculated using the CKD-EPI Creatinine Equation (2021)    Anion gap 8 5 - 15    Comment: Performed at [facility], [address] [place], [place] [address]  CBC     Status: Abnormal (Preliminary result)   Collection Time: 11/29/23  6:50 AM  Result Value Ref Range   WBC PENDING 4.0 - 10.5 K/uL   RBC 2.76 (L) 3.87 - 5.11 MIL/uL   Hemoglobin 11.0 (L) 12.0 - 15.0 g/dL   HCT 67.3 (L) 63.9 - 53.9 %   MCV 118.1 (H) 80.0 - 100.0 fL   MCH 39.9 (H) 26.0 - 34.0 pg   MCHC 33.7 30.0 - 36.0 g/dL   RDW 73.9 (H) 88.4 - 84.4 %    Comment: Performed at [facility], [address], [place], [place] [address]   Platelets PENDING 150 - 400 K/uL   nRBC PENDING 0.0 - 0.2 %    MR BRAIN WO CONTRAST Result Date: 11/28/2023 EXAM: MRI BRAIN WITHOUT CONTRAST 11/28/2023 07:40:36 PM TECHNIQUE: Multiplanar multisequence MRI of the head/brain was performed without the administration of intravenous contrast. COMPARISON: CT head 01/18/2019 and same day MRI cervical spine. CLINICAL HISTORY: LUE LLE weakness 1 month. FINDINGS: BRAIN AND VENTRICLES: No acute infarct. No intracranial hemorrhage. No mass. No midline shift. No hydrocephalus. The sella is unremarkable. Normal flow voids. T2/FLAIR hyperintensity in the periventricular and subcortical white matter with additional signal abnormality in the pons likely reflecting chronic microvascular ischemic changes. There is mild parenchymal volume loss. ORBITS: Bilateral lens replacement. SINUSES AND MASTOIDS: No acute abnormality. BONES AND SOFT TISSUES: Normal marrow signal. No acute soft tissue abnormality. Left forehead subcutaneous lipoma. Partially visualized abnormal soft tissue along the dorsal aspect of the dens, left with midline likely reflecting degenerative pannus with significant spinal canal narrowing, which is better evaluated on the same day MRI of the cervical spine. IMPRESSION: 1. No acute findings. 2. Moderate for age chronic microvascular ischemic changes. 3. Mild parenchymal volume loss. Electronically signed  by: Donnice Mania MD 11/28/2023 08:49 PM EDT RP Workstation: [id number]   MR Cervical Spine Wo Contrast Result Date: 11/28/2023 CLINICAL DATA:  Myelopathy, acute, cervical spine EXAM: MRI CERVICAL SPINE WITHOUT CONTRAST TECHNIQUE: Multiplanar, multisequence MR imaging of the cervical spine was performed. No intravenous contrast was administered. COMPARISON:  None Available. FINDINGS: Alignment: No substantial sagittal subluxation. Vertebrae: No fracture, evidence of discitis, or bone lesion. Cord: Suspected mild T2  hyperintensity in the cord at the craniocervical junction. Posterior Fossa, vertebral arteries, paraspinal tissues: Negative. Disc levels: C1-C2: Bulky pannus along the posterior left paramidline dens which results in severe canal stenosis and cord compression. C2-C3: Left greater than right facet and vertebral hypertrophy with resulting severe left foraminal stenosis. Patent canal and right foramen. C3-C4: Bilateral facet uncovertebral hypertrophy results in moderate to severe bilateral foraminal stenosis. Patent canal. C4-C5: Bilateral facet and uncovertebral hypertrophy with mild right greater than left foraminal stenosis. Patent canal. C5-C6: Posterior disc osteophyte complex with left greater than right facet and vertebral or she. Resulting moderate left and mild right foraminal stenosis. Patent canal. C6-C7: Posterior disc osteophyte complex with right greater left facet hypertrophy L horseshoe. Resulting moderate right and mild left foraminal stenosis. Patent canal. C7-T1: Bilateral facet uncovertebral hypertrophy. Resulting mild to moderate bilateral foraminal stenosis. Mild canal stenosis. IMPRESSION: 1. At C1-C2, bulky pannus along the posterior left paramidline dens results in severe canal stenosis and cord compression. Suspected mild T2 hyperintensity in the cord at this level, compatible with edema and/or myelomalacia. 2. At C2-C3, severe left foraminal stenosis. 3. At C3-C4, moderate to severe bilateral foraminal stenosis. 4. At C5-C6, moderate left and mild right foraminal stenosis. 5. At C6-C7, moderate right and mild left foraminal stenosis. 6. At C7-T1, mild to moderate bilateral foraminal stenosis and mild canal stenosis. Electronically Signed   By: Gilmore GORMAN Molt M.D.   On: 11/28/2023 20:32   CT Lumbar Spine Wo Contrast Result Date: 11/28/2023 CLINICAL DATA:  Myelopathy, acute, lumbar spine. EXAM: CT LUMBAR SPINE WITHOUT CONTRAST TECHNIQUE: Multidetector CT imaging of the lumbar spine was  performed without intravenous contrast administration. Multiplanar CT image reconstructions were also generated. RADIATION DOSE REDUCTION: This exam was performed according to the departmental dose-optimization program which includes automated exposure control, adjustment of the mA and/or kV according to patient size and/or use of iterative reconstruction technique. COMPARISON:  CT abdomen/pelvis 10/29/2022. FINDINGS: Segmentation: 5 non rib-bearing lumbar vertebral bodies Alignment: Similar grade 1 anterolisthesis of L3 on L4 and L4 on L5. Vertebrae: Similar L1 chronic compression fracture. No evidence of acute fracture. Paraspinal and other soft tissues: Aorto bi-iliac calcific atherosclerosis. Disc levels: Probably at least moderate and potentially severe canal stenosis at L3-L4 and L4-L5. IMPRESSION: 1. Similar L1 chronic compression fracture. 2. No evidence of acute fracture. Similar grade 1 anterolisthesis of L3 on L4 and L4 on L5. 3. Probably at least moderate and potentially severe canal and subarticular recess stenosis at L3-L4 and L4-L5. An MRI could better assess the canal and foramina if clinically warranted. Electronically Signed   By: Gilmore GORMAN Molt M.D.   On: 11/28/2023 20:21   DG Chest Portable 1 View Result Date: 11/28/2023 CLINICAL DATA:  weak EXAM: PORTABLE CHEST - 1 VIEW COMPARISON:  10/28/2022 FINDINGS: No focal airspace consolidation, pleural effusion, or pneumothorax. Borderline cardiomegaly. Aortic atherosclerosis. Osteopenia. No acute fracture or destructive lesions. Multilevel thoracic osteophytosis. IMPRESSION: No acute cardiopulmonary abnormality. Electronically Signed   By: Rogelia Myers M.D.   On: 11/28/2023 17:22    Review of Systems  Constitutional:  Positive for activity change.  Musculoskeletal:  Positive for gait problem and neck stiffness.  Neurological:  Positive for weakness and numbness.  Psychiatric/Behavioral: Negative.    All other systems reviewed and are  negative.  Blood pressure 134/67, pulse 92, temperature 98.2 F (36.8 C), resp. rate 16, height 5' 6 (1.676 m), weight 57.2 kg, SpO2 100%. Physical Exam Constitutional:      Appearance: Normal appearance. She is normal weight.  HENT:     Head: Normocephalic and atraumatic.     Right Ear: Tympanic membrane, ear canal and external ear normal.     Left Ear: Tympanic membrane, ear canal and external ear normal.     Nose: Nose normal.     Mouth/Throat:     Mouth: Mucous membranes are moist.     Pharynx: Oropharynx is clear.  Eyes:     Extraocular Movements: Extraocular movements intact.     Conjunctiva/sclera: Conjunctivae normal.     Pupils: Pupils are equal, round, and reactive to light.  Cardiovascular:     Rate and Rhythm: Normal rate and regular rhythm.  Pulmonary:     Effort: Pulmonary effort is normal.     Breath sounds: Normal breath sounds.  Abdominal:     General: Abdomen is flat.     Palpations: Abdomen is soft.  Musculoskeletal:        General: Normal range of motion.     Cervical back: Normal range of motion and neck supple.  Neurological:     Mental Status: She is alert.     Comments: Weakness in the tibialis anterior and the gastroc on the left at 2 out of 5.  Proximal strength is 4 out of 5 quadricep strength is 3 out of 5 right sided strength is normal.  Upper extremity strength reveals a deltoid is 4 out of 5 bicep is 4 out of 5 grip strength is 4- out of 5 on the left right-sided strength is normal reflexes are absent in the bicep tricep and the patella and the Achilles on the left side  Psychiatric:        Mood and Affect: Mood normal.        Behavior: Behavior normal.        Thought Content: Thought content normal.        Judgment: Judgment normal.     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  This is causing a high-grade compression against the posterior ring of the foramen  magnum.  Plan: Anterior surgery would be prohibitive to address the pannus directly.  Posterior decompression may be likely her best choice to help mitigate the situation.  This would require enlarging the foramen magnum centrally and off to the left side.  Whether any stabilization procedure would need to be considered is unlikely but needs to be better assessed with a CT scan of the craniocervical junction.  I will order this tonight.  Victory PARAS Alvetta Hidrogo 11/29/2023, 5:43 PM

## 2023-11-29 NOTE — Assessment & Plan Note (Signed)
-   Will continue momelotinib and hydroxyurea .

## 2023-11-29 NOTE — Progress Notes (Signed)
 The patient is admitted from [facility] to 5 N 15.. She's A & O x 4. Patient denies any acute pain at this moment. She's oriented to staff, call bell/ascom and her room. Full assessment to epic completed, Will continue to monitor.

## 2023-11-29 NOTE — ED Notes (Signed)
 Pt has food that she is eating now. Wants some medication for left knee pain.

## 2023-11-29 NOTE — Assessment & Plan Note (Signed)
 -  We will continue Synthroid.

## 2023-11-29 NOTE — Assessment & Plan Note (Addendum)
-   At C1-C2, bulky pannus along the posterior left paramidline dens results in severe canal stenosis and cord compression. Suspected mild T2 hyperintensity in the cord at this level, compatible with edema and/or myelomalacia. At C2-C3, severe left foraminal stenosis. - This is manifested by left upper and lower extremity weakness. - The patient be admitted to a medical monitored bed at [facility]. - Physical therapy consult will be obtained. - Pain management will be provided. - IV Decadron  will be given. - Neurosurgery consult will be obtained. - Dr. Alm Molt and Orlean Iha, PA-C were notified about the patient.

## 2023-11-29 NOTE — Progress Notes (Signed)
 [facility]  PROGRESS NOTE  Gabrielle Donovan FMW:[id number] DOB: [birth year] DOA: 11/28/2023 PCP: Lari Elspeth BRAVO, MD   Brief HPI:   78 y.o. African-American female with medical history significant for osteoarthritis, essential hypertension right sided low back pain and polycythemia, presented to the emergency room with acute onset of progressive left upper extremity weakness over the last 3 months and left lower extremity weakness over the last couple weeks.  The patient denied any paresthesias or other focal muscle weakness.  No dysphagia or dysarthria.  Cervical spine MRI without contrast revealed the following: 1. At C1-C2, bulky pannus along the posterior left paramidline dens results in severe canal stenosis and cord compression. Suspected mild T2 hyperintensity in the cord at this level, compatible with edema and/or myelomalacia. 2. At C2-C3, severe left foraminal stenosis. 3. At C3-C4, moderate to severe bilateral foraminal stenosis. 4. At C5-C6, moderate left and mild right foraminal stenosis. 5. At C6-C7, moderate right and mild left foraminal stenosis. 6. At C7-T1, mild to moderate bilateral foraminal stenosis and mild canal stenosis.     Assessment/Plan:   Cervical myelopathy (HCC) - At C1-C2, bulky pannus along the posterior left paramidline dens results in severe canal stenosis and cord compression. Suspected mild T2 hyperintensity in the cord at this level, compatible with edema and/or myelomalacia. At C2-C3, severe left foraminal stenosis. - This is manifested by left upper and lower extremity weakness. -Neurosurgery consulted - Dr. Alm Molt and Orlean Iha, PA-C were notified about the patient.   Hypothyroidism - We will continue Synthroid .   Essential hypertension - Will continue antihypertensive therapy. -Will hold chlorthalidone  -Continue Coreg  3.125 mg p.o. twice daily   Polycythemia vera (HCC) - Will continue momelotinib and hydroxyurea .         Medications     brimonidine   1 drop Right Eye QHS   carvedilol   3.125 mg Oral BID WC   chlorthalidone   25 mg Oral Daily   folic acid   1 mg Oral Daily   hydroxyurea   500 mg Oral Daily   latanoprost   1 drop Right Eye QHS   levothyroxine   50 mcg Oral QODAY   [START ON 11/30/2023] levothyroxine   75 mcg Oral QODAY   loratadine   10 mg Oral Daily     Data Reviewed:   CBG:  Recent Labs  Lab 11/28/23 1616  GLUCAP 99    SpO2: 96 %    Vitals:   11/29/23 0200 11/29/23 0300 11/29/23 0454 11/29/23 0837  BP: 128/61 (!) 126/58 (!) 151/75 (!) 141/68  Pulse: 78 77 75 77  Resp: (!) 21 17  16   Temp:  98.1 F (36.7 C) (!) 97.4 F (36.3 C) (!) 97.5 F (36.4 C)  TempSrc:   Oral   SpO2: 95% 93% 99% 96%  Weight:      Height:          Data Reviewed:  Basic Metabolic Panel: Recent Labs  Lab 11/28/23 1650 11/29/23 0650  NA 137 138  K 3.6 3.4*  CL 103 108  CO2 22 22  GLUCOSE 108* 82  BUN 12 10  CREATININE 0.62 0.62  CALCIUM 9.3 8.7*    CBC: Recent Labs  Lab 11/28/23 1650 11/29/23 0650  WBC 13.6* PENDING  HGB 12.5 11.0*  HCT 37.0 32.6*  MCV 121.7* 118.1*  PLT 482* PENDING    LFT Recent Labs  Lab 11/28/23 1650  AST 42*  ALT 25  ALKPHOS 119  BILITOT 0.8  PROT 7.9  ALBUMIN 4.3  Antibiotics: Anti-infectives (From admission, onward)    None        DVT prophylaxis: SCDs  Code Status: DNR  Family Communication: Discussed with patient's son at bedside   CONSULTS neurosurgery   Subjective   Denies any complaints.   Objective    Physical Examination:   General-appears in no acute distress Heart-S1-S2, regular, no murmur auscultated Lungs-clear to auscultation bilaterally, no wheezing or crackles auscultated Abdomen-soft, nontender, no organomegaly Extremities-no edema in the lower extremities Neuro-alert, oriented x3, mild weakness in left upper extremity and left lower extremity, DTRs 2+ in left lower extremity, sensations  intact bilaterally   Status is: Inpatient:             Gabrielle Donovan   [facility] If 7PM-7AM, please contact night-coverage at [email or web address], Office  [phone]   11/29/2023, 9:31 AM  LOS: 1 day

## 2023-11-30 DIAGNOSIS — G959 Disease of spinal cord, unspecified: Secondary | ICD-10-CM | POA: Diagnosis not present

## 2023-11-30 MED ORDER — CHLORTHALIDONE 25 MG PO TABS
25.0000 mg | ORAL_TABLET | Freq: Every day | ORAL | Status: DC
  Administered 2023-12-01 – 2023-12-04 (×4): 25 mg via ORAL
  Filled 2023-11-30 (×4): qty 1

## 2023-11-30 MED ORDER — POTASSIUM CHLORIDE 20 MEQ PO PACK
40.0000 meq | PACK | Freq: Once | ORAL | Status: AC
  Administered 2023-11-30: 40 meq via ORAL
  Filled 2023-11-30: qty 2

## 2023-11-30 NOTE — Progress Notes (Signed)
  Progress Note   Patient: Gabrielle Donovan FMW:[id number] DOB: 1945/05/28 DOA: 11/28/2023     2 DOS: the patient was seen and examined on 11/30/2023 at 8:30AM      Brief hospital course: 78 y.o. F with polycythemia, HTN, degenerative spine disease presented with progressive left sided weakness.  Imaging revealed C1 cervical spinal stenosis.  NSGY consulted.     Assessment and Plan: * Cervical myelopathy (HCC) CT obtained by neurosurgery overnight -Consult neurosurgery  Hypothyroidism -Continue levothyroxine   Essential hypertension Blood pressure elevated - Continue carvedilol  - Resume chlorthalidone   Polycythemia vera (HCC) Counts stable relative to baseline - Continue hydroxyurea , folate  Hypokalemia - Supplement potassium        Subjective: No change to her previous symptoms.  No fever.     Physical Exam: BP (!) 155/67   Pulse 84   Temp 98.5 F (36.9 C) (Oral)   Resp 19   Ht 5' 6 (1.676 m)   Wt 57.2 kg   SpO2 97%   BMI 20.34 kg/m   Elderly adult female, sitting up in bed, interactive and appropriate RRR, no murmurs, no peripheral edema Respiratory rate normal, lungs clear without rales or wheezes Abdomen soft, no tenderness palpation or guarding Speech fluent, oriented to person, place, and time, face symmetric except for chronic right eyelid droop, there is subtle left-sided weakness in the arm and leg  Data Reviewed: Basic metabolic panel shows mild hypokalemia CBC shows mild anemia CT of the cervical spine shows prominent soft tissue pannus at C1-C2 resulting in severe left-sided spinal canal stenosis     Family Communication:     Disposition: Status is: Inpatient         Author: Lonni SHAUNNA Dalton, MD 11/30/2023 4:55 PM  For on call review [email or web address].

## 2023-11-30 NOTE — Plan of Care (Signed)

## 2023-11-30 NOTE — H&P (View-Only) (Signed)
 Patient ID: Gabrielle Donovan, female   DOB: 10/02/1945, 78 y.o.   MRN: [id number] The opportunity to review the CT scan and have radiology perform some additional reformatted images.  Bijal will require a substantial decompression of the suboccipital region in addition to directly addressing the pannus formation behind C2.  This is a rather extensive operation and does pose some risk of stroke and debilitation.  She has however become progressively debilitated in the last few months by the presence of this mass.  This will need to be scheduled on an elective basis in the near future however and in the meantime that he can be discharged home to be with her family.  My office will reach out to the daughter to plan the procedure.  Today's discussion lasted 20 minutes during which time I demonstrated the images and the concerns from a surgical perspective and also explained the major risks to the patient's family including the daughter and the son who are present her granddaughter was also present.  Questions were answered.  Risks were explained.

## 2023-11-30 NOTE — Hospital Course (Signed)
 78 y.o. F with polycythemia, HTN, degenerative spine disease presented with progressive left sided weakness.  Imaging revealed C1 cervical spinal stenosis.  NSGY consulted.

## 2023-11-30 NOTE — Progress Notes (Addendum)
 Patient ID: Rush CHRISTELLA Molt, female   DOB: 10/02/1945, 78 y.o.   MRN: [id number] The opportunity to review the CT scan and have radiology perform some additional reformatted images.  Bijal will require a substantial decompression of the suboccipital region in addition to directly addressing the pannus formation behind C2.  This is a rather extensive operation and does pose some risk of stroke and debilitation.  She has however become progressively debilitated in the last few months by the presence of this mass.  This will need to be scheduled on an elective basis in the near future however and in the meantime that he can be discharged home to be with her family.  My office will reach out to the daughter to plan the procedure.  Today's discussion lasted 20 minutes during which time I demonstrated the images and the concerns from a surgical perspective and also explained the major risks to the patient's family including the daughter and the son who are present her granddaughter was also present.  Questions were answered.  Risks were explained.

## 2023-12-01 DIAGNOSIS — G959 Disease of spinal cord, unspecified: Secondary | ICD-10-CM | POA: Diagnosis not present

## 2023-12-01 NOTE — Evaluation (Signed)
 Physical Therapy Evaluation Patient Details Name: Gabrielle Donovan MRN: [id number] DOB: Sep 08, 1945 Today's Date: 12/01/2023  History of Present Illness  Gabrielle Donovan is an 78 yo female who presented 11/28/23 with progressive L sided weakness. Imaging revealed C1 cervical spinal stenosis, NSGY  consulted and suggested near-future surgery. PMHx:  OA, essential HTN, right sided low back pain, and polycythemia.   Clinical Impression  Pt admitted with above diagnosis. PTA, pt was modI for functional mobility using RW vs. Rollator and modI for ADLs/IADLs. She lives with her niece in a mobile home with 2 STE. Pt reports 24/7 supervision and assist available from family. Pt currently with functional limitations due to the deficits listed below (see PT Problem List). She required min-modA for transfers using RW and CGA-minA for short-distance ambulation using RW with a close chair follow. Pt ambulated a maximum of ~37ft with a step-to laborious gait pattern. She was unsteady during mobility with noted postural sway requiring minA at all times for safety. Pt is considered a high fall risk. She quickly fatigued requiring seated rests break in recliner chair. Discussed using manual w/c for mobility currently. Educated pt on BLE HEP and provided handout (Med[employer] Access Code [id number]). Pt will benefit from acute skilled PT to increase her independence and safety with mobility to allow discharge. Recommend HHPT to increase strength, improve balance, decrease fall risk, advance activity tolerance, and optimize safety within the home environment.      If plan is discharge home, recommend the following: A lot of help with walking and/or transfers;A little help with bathing/dressing/bathroom;Assistance with cooking/housework;Assist for transportation;Help with stairs or ramp for entrance   Can travel by private vehicle        Equipment Recommendations None recommended by PT (Pt already has DME)  Recommendations for  Other Services       Functional Status Assessment Patient has had a recent decline in their functional status and demonstrates the ability to make significant improvements in function in a reasonable and predictable amount of time.     Precautions / Restrictions Precautions Precautions: Fall Recall of Precautions/Restrictions: Impaired Restrictions Weight Bearing Restrictions Per Provider Order: No      Mobility  Bed Mobility Overal bed mobility: Needs Assistance             General bed mobility comments: Not assessed. Pt greeted in recliner chair and returned there at end of session.    Transfers Overall transfer level: Needs assistance Equipment used: Rolling walker (2 wheels) Transfers: Sit to/from Stand, Bed to chair/wheelchair/BSC Sit to Stand: Min assist, Mod assist   Step pivot transfers: Mod assist       General transfer comment: Pt stood from recliner chair. Cued pt to scoot fwd to edge, increase knee flex, increase fwd lean, use momentum to power up, and proper hand placement using RW. Pt opted to place LUE on RW cross bar and RUE on arm rest. She required min-modA to stand. Pt took increased time to bring BUE support onto RW grips. She kept RUE on arm rest and began marching RLE to wake it up. Pt demonstrated significant fwd lean. Cues for upright posture, hip tuck, hip ext. Pt unable to achieve netural posture. She fatigued and required a seated rest break before second stand attempt. Good eccentric control. Pt transferred to [facility]. Cues for use of RW, pt letting go of AD to reach for armrests of commode. Assist to pivot hips safely and cue pt to take steps.    Ambulation/Gait  Ambulation/Gait assistance: Contact guard assist, Min assist, +2 safety/equipment (chair follow) Gait Distance (Feet): 10 Feet (1x10, prolonged seated rest break, 1x6) Assistive device: Rolling walker (2 wheels) Gait Pattern/deviations: Step-to pattern, Decreased step length - right,  Decreased step length - left, Knee flexed in stance - right, Knee flexed in stance - left, Trunk flexed, Narrow base of support Gait velocity: decreased     General Gait Details: Pt ambulated with short slow laborious steps. She demonstrated a significant fwd lean, kyphotic posture, genu valgum, and maintained B knee flex throughout steps. Cues for increased BOS, step length, and posture. Pt unable to adjust. She was unsteady with noted postural sway, and quickly fatigued. Close chair follow provided.  Stairs Stairs:  (Pt declined stair training.)          Wheelchair Mobility     Tilt Bed    Modified Rankin (Stroke Patients Only)       Balance Overall balance assessment: Needs assistance Sitting-balance support: Feet supported Sitting balance-Leahy Scale: Fair     Standing balance support: Bilateral upper extremity supported, During functional activity, Reliant on assistive device for balance Standing balance-Leahy Scale: Poor Standing balance comment: Pt dependent on RW                             Pertinent Vitals/Pain Pain Assessment Pain Assessment: No/denies pain    Home Living Family/patient expects to be discharged to:: Private residence Living Arrangements: Other relatives (niece) Available Help at Discharge: Family;Available 24 hours/day (Pt says someone is always there. Her niece and girlfriend do not work, so can provide assistance as needed.) Type of Home: Mobile home Home Access: Stairs to enter Entrance Stairs-Rails: None Entrance Stairs-Number of Steps: 2 (small)   Home Layout: One level Home Equipment: Agricultural consultant (2 wheels);Rollator (4 wheels);Cane - single point;BSC/3in1;Shower seat;Wheelchair - manual      Prior Function Prior Level of Function : Independent/Modified Independent;Driving             Mobility Comments: Ambulates using RW vs rollator. Pt reports everything taking increased time d/t L hemi weakness. 1 recent  fall on Sunday. ADLs Comments: ModI with ADLs/IADLs. Drives, cooks/cleans     Extremity/Trunk Assessment   Upper Extremity Assessment Upper Extremity Assessment: Defer to OT evaluation    Lower Extremity Assessment Lower Extremity Assessment: Generalized weakness    Cervical / Trunk Assessment Cervical / Trunk Assessment: Kyphotic  Communication   Communication Communication: No apparent difficulties    Cognition Arousal: Alert Behavior During Therapy: WFL for tasks assessed/performed   PT - Cognitive impairments: No apparent impairments                       PT - Cognition Comments: Pt A,Ox4 Following commands: Intact       Cueing Cueing Techniques: Verbal cues     General Comments General comments (skin integrity, edema, etc.): VSS on RA    Exercises General Exercises - Lower Extremity Ankle Circles/Pumps: Both, AROM, 5 reps Quad Sets: Both, AROM, 5 reps Long Arc Quad: Both, AROM, 5 reps Other Exercises Other Exercises: Educated pt on HEP and provided handout (Medbridge Access Code [email or web address] ).   Assessment/Plan    PT Assessment Patient needs continued PT services  PT Problem List Decreased activity tolerance;Decreased balance;Decreased mobility;Decreased strength;Decreased knowledge of use of DME;Decreased safety awareness       PT Treatment Interventions DME instruction;Gait training;Stair training;Functional mobility training;Therapeutic activities;Therapeutic exercise;Balance  training;Neuromuscular re-education;Patient/family education    PT Goals (Current goals can be found in the Care Plan section)  Acute Rehab PT Goals Patient Stated Goal: Return Home PT Goal Formulation: With patient Time For Goal Achievement: 12/15/23 Potential to Achieve Goals: Fair    Frequency Min 2X/week     Co-evaluation               AM-PAC PT 6 Clicks Mobility  Outcome Measure Help needed turning from your back to your side while in a flat bed  without using bedrails?: A Lot Help needed moving from lying on your back to sitting on the side of a flat bed without using bedrails?: A Lot Help needed moving to and from a bed to a chair (including a wheelchair)?: A Lot Help needed standing up from a chair using your arms (e.g., wheelchair or bedside chair)?: A Lot Help needed to walk in hospital room?: Total Help needed climbing 3-5 steps with a railing? : Total 6 Click Score: 10    End of Session Equipment Utilized During Treatment: Gait belt Activity Tolerance: Patient limited by fatigue Patient left: Other (comment) (on [facility]) Nurse Communication: Mobility status PT Visit Diagnosis: Muscle weakness (generalized) (M62.81);Difficulty in walking, not elsewhere classified (R26.2);Unsteadiness on feet (R26.81)    Time: 9040-8969 PT Time Calculation (min) (ACUTE ONLY): 31 min   Charges:   PT Evaluation $PT Eval Moderate Complexity: 1 Mod PT Treatments $Therapeutic Exercise: 8-22 mins PT General Charges $$ ACUTE PT VISIT: 1 Visit         Randall SAUNDERS, PT, DPT Acute Rehabilitation Services Office: [phone] Secure Chat Preferred  Delon CHRISTELLA Callander 12/01/2023, 11:24 AM

## 2023-12-01 NOTE — Evaluation (Addendum)
 Occupational Therapy Evaluation Patient Details Name: Gabrielle Donovan MRN: [id number] DOB: 12/08/1945 Today's Date: 12/01/2023   History of Present Illness   Gabrielle Donovan is an 78 yo female who presented with progressive L sided weakness. Imaging revealed C1 cervical spinal stenosis, nsgy consulted and suggested near-future surgery. PMHx:  osteoarthritis, essential hypertension right sided low back pain and polycythemia     Clinical Impressions [name] was evaluated s/p the above admission list. She is mod I at baseline but reports 1 recent fall and progressive L weakness that is starting to become a barrier to mobility and ADLs. Upon evaluation the pt was limited by L weakness, kyphotic posture, unsteady balance and limited activity tolerance. Overall she needed min A to stand and close CGA fro mobility with a RW. Pt needed increased time and cues to safety move her LLE. Due to the deficits listed below the pt also needs up to min A for LB ADLs and set up A for UB ADLs. Pt will benefit from continued acute OT services and HHOT.      If plan is discharge home, recommend the following:   A little help with walking and/or transfers;A little help with bathing/dressing/bathroom;Two people to help with bathing/dressing/bathroom;Assist for transportation;Help with stairs or ramp for entrance;Assistance with cooking/housework     Functional Status Assessment   Patient has had a recent decline in their functional status and demonstrates the ability to make significant improvements in function in a reasonable and predictable amount of time.     Equipment Recommendations   None recommended by OT      Precautions/Restrictions   Precautions Precautions: Fall Restrictions Weight Bearing Restrictions Per Provider Order: No     Mobility Bed Mobility Overal bed mobility: Needs Assistance             General bed mobility comments: sitting EOB on arrival    Transfers Overall  transfer level: Needs assistance Equipment used: Rolling walker (2 wheels) Transfers: Sit to/from Stand Sit to Stand: Min assist           General transfer comment: close CGA for mobility with RW      Balance Overall balance assessment: Needs assistance Sitting-balance support: Feet supported Sitting balance-Leahy Scale: Fair     Standing balance support: Bilateral upper extremity supported, During functional activity Standing balance-Leahy Scale: Poor             ADL either performed or assessed with clinical judgement   ADL Overall ADL's : Needs assistance/impaired Eating/Feeding: Independent   Grooming: Set up;Sitting   Upper Body Bathing: Set up;Sitting   Lower Body Bathing: Minimal assistance;Sit to/from stand   Upper Body Dressing : Set up;Sitting   Lower Body Dressing: Minimal assistance;Sit to/from stand   Toilet Transfer: Minimal assistance;Ambulation;Rolling walker (2 wheels)   Toileting- Clothing Manipulation and Hygiene: Contact guard assist;Sitting/lateral lean       Functional mobility during ADLs: Minimal assistance;Rolling walker (2 wheels) General ADL Comments: min A for STS transfers, pt unsteady in steady and has difficulty managing LLE     Vision Baseline Vision/History: 0 No visual deficits Ability to See in Adequate Light: 0 Adequate Vision Assessment?: No apparent visual deficits     Perception Perception: Not tested       Praxis Praxis: Not tested       Pertinent Vitals/Pain Pain Assessment Pain Assessment: No/denies pain     Extremity/Trunk Assessment Upper Extremity Assessment Upper Extremity Assessment: Generalized weakness   Lower Extremity Assessment Lower Extremity  Assessment: Defer to PT evaluation   Cervical / Trunk Assessment Cervical / Trunk Assessment: Kyphotic   Communication Communication Communication: No apparent difficulties   Cognition Arousal: Alert Behavior During Therapy: WFL for tasks  assessed/performed Cognition: No apparent impairments, No family/caregiver present to determine baseline             OT - Cognition Comments: overall WFL, limited insight to deficits and fall risk                 Following commands: Intact       Cueing  General Comments   Cueing Techniques: Verbal cues  VSS on RA           Home Living Family/patient expects to be discharged to:: Private residence Living Arrangements: Other relatives (niece) Available Help at Discharge: Family;Available 24 hours/day (pt says someone is always there) Type of Home: House Home Access: Stairs to enter [employer] of Steps: 2   Home Layout: One level     Bathroom Shower/Tub: Tub/shower unit;Walk-in shower   Bathroom Toilet: Standard     Home Equipment: Agricultural consultant (2 wheels);Rollator (4 wheels);Cane - single point;BSC/3in1;Shower seat;Wheelchair - manual          Prior Functioning/Environment Prior Level of Function : Independent/Modified Independent             Mobility Comments: RW vs rollator for all mobility due to increased L hemi weakness. 1 recent fall ADLs Comments: mod I, drives, cooks/cleans    OT Problem List: Decreased strength;Decreased range of motion;Decreased activity tolerance;Impaired balance (sitting and/or standing);Decreased safety awareness;Decreased knowledge of use of DME or AE;Decreased knowledge of precautions   OT Treatment/Interventions: Self-care/ADL training;Therapeutic exercise;DME and/or AE instruction;Therapeutic activities;Patient/family education;Balance training      OT Goals(Current goals can be found in the care plan section)   Acute Rehab OT Goals Patient Stated Goal: home OT Goal Formulation: With patient Time For Goal Achievement: 12/15/23 Potential to Achieve Goals: Good ADL Goals Pt Will Perform Lower Body Bathing: with modified independence;sit to/from stand Pt Will Perform Lower Body Dressing: with  modified independence;sit to/from stand Pt Will Transfer to Toilet: with modified independence;ambulating;regular height toilet Additional ADL Goal #1: Pt will navigate hospital environmnet with mod I and LRAD independently to demonstrate decreased fall risk   OT Frequency:  Min 2X/week       AM-PAC OT 6 Clicks Daily Activity     Outcome Measure Help from another person eating meals?: None Help from another person taking care of personal grooming?: A Little Help from another person toileting, which includes using toliet, bedpan, or urinal?: A Little Help from another person bathing (including washing, rinsing, drying)?: A Little Help from another person to put on and taking off regular upper body clothing?: A Little Help from another person to put on and taking off regular lower body clothing?: A Little 6 Click Score: 19   End of Session Equipment Utilized During Treatment: Gait belt;Rolling walker (2 wheels) Nurse Communication: Mobility status  Activity Tolerance: Patient tolerated treatment well Patient left: in chair;with call bell/phone within reach  OT Visit Diagnosis: Unsteadiness on feet (R26.81);Other abnormalities of gait and mobility (R26.89);Muscle weakness (generalized) (M62.81);History of falling (Z91.81)                Time: 9179-9159 OT Time Calculation (min): 20 min Charges:  OT General Charges $OT Visit: 1 Visit OT Evaluation $OT Eval Moderate Complexity: 1 Mod  Lucie Kendall, OTR/L Acute Rehabilitation Services Office [phone] Secure Chat  Communication Preferred   Lucie JONETTA Kendall 12/01/2023, 8:53 AM

## 2023-12-01 NOTE — Plan of Care (Signed)
  Problem: Education: Goal: Knowledge of General Education information will improve Description: Including pain rating scale, medication(s)/side effects and non-pharmacologic comfort measures Outcome: Progressing   Problem: Clinical Measurements: Goal: Will remain free from infection Outcome: Progressing Goal: Respiratory complications will improve Outcome: Progressing   Problem: Skin Integrity: Goal: Risk for impaired skin integrity will decrease Outcome: Progressing   Problem: Activity: Goal: Risk for activity intolerance will decrease Outcome: Not Progressing

## 2023-12-01 NOTE — Progress Notes (Signed)
  Progress Note   Patient: Gabrielle Donovan FMW:[id number] DOB: 1945/07/18 DOA: 11/28/2023     3 DOS: the patient was seen and examined on 12/01/2023       Brief hospital course: 78 y.o. F with polycythemia, HTN, degenerative spine disease presented with progressive left sided weakness.  Imaging revealed C1 cervical spinal stenosis.  NSGY consulted.     Assessment and Plan: * Cervical myelopathy (HCC) CT shows worse pannus.  NSGY recommend outpatient follow for surgery in next few weeks.    Discussed with PT, she is unsafe for discharge today due to weakness and difficulty with gait.  They recommend additional PT/OT treatments and likely home tomorrow - PT/OT   Hypothyroidism -Continue levothyroxine   Essential hypertension BP normal - Continue carvedilol , chlorthalidone    Polycythemia vera (HCC) Counts stable relative to baseline - Continue hydroxyurea , folate  Hypokalemia Supplemented        Subjective: No change, no nursing concerns. Toruble mobilizing with PT.     Physical Exam: BP 123/66 (BP Location: Right Arm)   Pulse 100   Temp 99.7 F (37.6 C) (Oral)   Resp 16   Ht 5' 6 (1.676 m)   Wt 57.2 kg   SpO2 100%   BMI 20.34 kg/m   Elderly adult female, sitting up in bed, interactive and appropriate RRR, no murmurs, no peripheral edema Respiratory rate normal, lungs clear without rales or wheezes Abdomen soft, no tenderness palpation or guarding Speech fluent, oriented to person, place, and time, face symmetric except for chronic right eyelid droop, there is subtle left-sided weakness in the arm and leg      Family Communication:     Disposition: Status is: Inpatient Given her weakness and age, I agree with PT that she is not safe for discharge to home today, would benefit from additional therapy        Author: Lonni SHAUNNA Dalton, MD 12/01/2023 6:14 PM  For on call review [email or web address].

## 2023-12-02 ENCOUNTER — Other Ambulatory Visit: Payer: Self-pay | Admitting: Neurological Surgery

## 2023-12-02 DIAGNOSIS — G959 Disease of spinal cord, unspecified: Secondary | ICD-10-CM | POA: Diagnosis not present

## 2023-12-02 MED ORDER — CARVEDILOL 3.125 MG PO TABS: 3.1250 mg | ORAL_TABLET | Freq: Two times a day (BID) | ORAL | 0 refills | Status: AC

## 2023-12-02 MED ORDER — CHLORTHALIDONE 25 MG PO TABS: 25.0000 mg | ORAL_TABLET | Freq: Every day | ORAL | 0 refills | Status: AC | PRN

## 2023-12-02 MED ORDER — OXYCODONE HCL 5 MG PO TABS: 5.0000 mg | ORAL_TABLET | Freq: Four times a day (QID) | ORAL | 0 refills | Status: DC | PRN

## 2023-12-02 NOTE — Discharge Summary (Signed)
 Physician Discharge Summary   Patient: Gabrielle Donovan MRN: [id number] DOB: 1945/11/16  Admit date:     11/28/2023  Discharge date: 12/02/23  Discharge Physician: Lonni SHAUNNA Dalton   PCP: Lari Elspeth BRAVO, MD     Recommendations at discharge:  Follow up with Dr. Colon for cervical decompression surgery as directed     Discharge Diagnoses: Principal Problem:   Cervical myelopathy (HCC) Active Problems:   Polycythemia vera (HCC)   Essential hypertension   Hypothyroidism      Hospital Course: 78 y.o. F with polycythemia, HTN, degenerative spine disease presented with progressive left sided weakness.  Imaging revealed C1 cervical spinal stenosis.  NSGY consulted.      Cervical myelopathy [facility]) Admitted and neurosurgery consulted.  Axial imaging showed worsening pannus formation at C1 with spinal cord compression.  NSGY recommend outpatient follow for surgery in next few weeks.     Worked with physical therapy and discussed with family, she has instability but overall strength is reasonably safe provided she has 24/7 care at home, which she does.     Hypothyroidism Stable on LT4   Essential hypertension BP normal on carvedilol , chlorthalidone     Polycythemia vera (HCC) Counts stable relative to baseline   Hypokalemia Supplemented              The [place]  Controlled Substances Registry was reviewed for this patient prior to discharge.  Consultants: Neurosurgery   Disposition: Home health Diet recommendation:  Discharge Diet Orders (From admission, onward)     Start     Ordered   12/02/23 0000  Diet - low sodium heart healthy        12/02/23 1103             DISCHARGE MEDICATION: Allergies as of 12/02/2023       Reactions   Codeine Nausea And Vomiting   Hydrocodone  Nausea And Vomiting        Medication List     PAUSE taking these medications    aspirin  EC 81 MG tablet Wait to take this until your doctor or other care  provider tells you to start again. Take 81 mg by mouth daily. Swallow whole.       TAKE these medications    brimonidine  0.2 % ophthalmic solution Commonly known as: ALPHAGAN  Place 1 drop into the right eye at bedtime.   carvedilol  3.125 MG tablet Commonly known as: COREG  Take 1 tablet (3.125 mg total) by mouth 2 (two) times daily with a meal.   cetirizine 10 MG tablet Commonly known as: ZYRTEC Take 10 mg by mouth daily.   chlorthalidone  25 MG tablet Commonly known as: HYGROTON  Take 1 tablet (25 mg total) by mouth daily as needed (for high BP).   folic acid  1 MG tablet Commonly known as: FOLVITE  Take 1 mg by mouth daily.   hydroxyurea  500 MG capsule Commonly known as: HYDREA  Take 500 mg by mouth in the morning, at noon, in the evening, and at bedtime.   latanoprost  0.005 % ophthalmic solution Commonly known as: XALATAN  Place 1 drop into the right eye at bedtime.   oxyCODONE  5 MG immediate release tablet Commonly known as: Oxy IR/ROXICODONE  Take 1 tablet (5 mg total) by mouth every 6 (six) hours as needed for severe pain (pain score 7-10).   Synthroid  75 MCG tablet Generic drug: levothyroxine  Take 75 mcg by mouth every other day. Alternating with 5mcg   Synthroid  50 MCG tablet Generic drug: levothyroxine  Take 50 mcg by  mouth every other day. Alternating with 75mcg        Follow-up Information     Burdine, Elspeth BRAVO, MD Follow up.   Specialty: Family Medicine Contact information: [address] [place] [address] [phone]         Colon Shove, MD Follow up.   Specialty: Neurosurgery Contact information: [address] [place] [address] [phone]                 Discharge Instructions     Diet - low sodium heart healthy   Complete by: As directed    Discharge instructions   Complete by: As directed    You were admitted for weakness  Here, we found that you have cervical stenosis at the C1 level  You should  have surgery for this  Call Dr. [place] office to confirm the timing of surgery  For pain, you may take oxycodone  or acetaminophen  (or both)  Continue all your home medicines, except aspirin , hold aspirin  until after surgery  You SHOULD be taking both Carvedilol  and chlorthalidone  daily however   Increase activity slowly   Complete by: As directed        Discharge Exam: Filed Weights   11/28/23 1609  Weight: 57.2 kg    General: Pt is alert, awake, not in acute distress Cardiovascular: RRR, nl S1-S2, no murmurs appreciated.   No LE edema.   Respiratory: Normal respiratory rate and rhythm.  CTAB without rales or wheezes. Abdominal: Abdomen soft and non-tender.  No distension or HSM.   Neuro/Psych: Strength slightly weak on left arm, and more so leg relative to right.  Overall strength 4/5 on left.     Judgment and insight appear normal.   Condition at discharge: good  The results of significant diagnostics from this hospitalization (including imaging, microbiology, ancillary and laboratory) are listed below for reference.   Imaging Studies: CT CERVICAL SPINE WO CONTRAST Result Date: 11/30/2023 EXAM: CT CERVICAL SPINE WITHOUT CONTRAST 11/29/2023 07:25:00 PM TECHNIQUE: CT of the cervical spine was performed without the administration of intravenous contrast. Multiplanar reformatted images are provided for review. Automated exposure control, iterative reconstruction, and/or weight based adjustment of the mA/kV was utilized to reduce the radiation dose to as low as reasonably achievable. COMPARISON: CT of the cervical spine dated 01/18/2019 and MRI of the cervical spine dated 11/28/2023. CLINICAL HISTORY: Bone lesion, cervical spine, incidental; Attention craniocervical junction. FINDINGS: CERVICAL SPINE: BONES AND ALIGNMENT: There is reversal of the normal cervical lordosis. DEGENERATIVE CHANGES: C1-2: There is a prominent soft tissue pannus which is eccentric to the left resulting in  severe left-sided spinal canal stenosis, compression, and right posterolateral displacement of the spinal cord at the craniocervical junction. C2: Benign-appearing cysts are present within the body of C2 on the left along the base of the dens, similar to the prior exams. C2-3: There is moderate left facet hypertrophy causing mild-to-moderate left neural foraminal stenosis. C3-4: There is disc space narrowing and mild uncovertebral joint hypertrophy with mild central spinal canal stenosis and mild bilateral neural foraminal stenosis. C4-5: The disc spaces are satisfactorily preserved and the spinal canal and neural foramina are patent. C5-6: There is disc space narrowing and bilateral endplate ridging with mild central spinal canal stenosis and bilateral neural foraminal stenosis. C6-7: There is disc space narrowing with mild right neural foraminal stenosis. SOFT TISSUES: No prevertebral soft tissue swelling. IMPRESSION: 1. Prominent soft tissue pannus at C1-2 resulting in severe left-sided spinal canal stenosis,  compression, and right posterolateral displacement of the spinal cord at the craniocervical junction. 2. Disc space narrowing and mild uncovertebral joint hypertrophy at C3-4 with mild central spinal canal stenosis and mild bilateral neural foraminal stenosis. 3. Disc space narrowing and bilateral endplate ridging at C5-6 with mild central spinal canal stenosis and bilateral neural foraminal stenosis. 4. Disc space narrowing at C6-7 with mild right neural foraminal stenosis. Electronically signed by: Evalene Coho MD 11/30/2023 04:34 AM EDT RP Workstation: [id number]   MR BRAIN WO CONTRAST Result Date: 11/28/2023 EXAM: MRI BRAIN WITHOUT CONTRAST 11/28/2023 07:40:36 PM TECHNIQUE: Multiplanar multisequence MRI of the head/brain was performed without the administration of intravenous contrast. COMPARISON: CT head 01/18/2019 and same day MRI cervical spine. CLINICAL HISTORY: LUE LLE weakness 1 month.  FINDINGS: BRAIN AND VENTRICLES: No acute infarct. No intracranial hemorrhage. No mass. No midline shift. No hydrocephalus. The sella is unremarkable. Normal flow voids. T2/FLAIR hyperintensity in the periventricular and subcortical white matter with additional signal abnormality in the pons likely reflecting chronic microvascular ischemic changes. There is mild parenchymal volume loss. ORBITS: Bilateral lens replacement. SINUSES AND MASTOIDS: No acute abnormality. BONES AND SOFT TISSUES: Normal marrow signal. No acute soft tissue abnormality. Left forehead subcutaneous lipoma. Partially visualized abnormal soft tissue along the dorsal aspect of the dens, left with midline likely reflecting degenerative pannus with significant spinal canal narrowing, which is better evaluated on the same day MRI of the cervical spine. IMPRESSION: 1. No acute findings. 2. Moderate for age chronic microvascular ischemic changes. 3. Mild parenchymal volume loss. Electronically signed by: Donnice Mania MD 11/28/2023 08:49 PM EDT RP Workstation: [id number]   MR Cervical Spine Wo Contrast Result Date: 11/28/2023 CLINICAL DATA:  Myelopathy, acute, cervical spine EXAM: MRI CERVICAL SPINE WITHOUT CONTRAST TECHNIQUE: Multiplanar, multisequence MR imaging of the cervical spine was performed. No intravenous contrast was administered. COMPARISON:  None Available. FINDINGS: Alignment: No substantial sagittal subluxation. Vertebrae: No fracture, evidence of discitis, or bone lesion. Cord: Suspected mild T2 hyperintensity in the cord at the craniocervical junction. Posterior Fossa, vertebral arteries, paraspinal tissues: Negative. Disc levels: C1-C2: Bulky pannus along the posterior left paramidline dens which results in severe canal stenosis and cord compression. C2-C3: Left greater than right facet and vertebral hypertrophy with resulting severe left foraminal stenosis. Patent canal and right foramen. C3-C4: Bilateral facet uncovertebral  hypertrophy results in moderate to severe bilateral foraminal stenosis. Patent canal. C4-C5: Bilateral facet and uncovertebral hypertrophy with mild right greater than left foraminal stenosis. Patent canal. C5-C6: Posterior disc osteophyte complex with left greater than right facet and vertebral or she. Resulting moderate left and mild right foraminal stenosis. Patent canal. C6-C7: Posterior disc osteophyte complex with right greater left facet hypertrophy L horseshoe. Resulting moderate right and mild left foraminal stenosis. Patent canal. C7-T1: Bilateral facet uncovertebral hypertrophy. Resulting mild to moderate bilateral foraminal stenosis. Mild canal stenosis. IMPRESSION: 1. At C1-C2, bulky pannus along the posterior left paramidline dens results in severe canal stenosis and cord compression. Suspected mild T2 hyperintensity in the cord at this level, compatible with edema and/or myelomalacia. 2. At C2-C3, severe left foraminal stenosis. 3. At C3-C4, moderate to severe bilateral foraminal stenosis. 4. At C5-C6, moderate left and mild right foraminal stenosis. 5. At C6-C7, moderate right and mild left foraminal stenosis. 6. At C7-T1, mild to moderate bilateral foraminal stenosis and mild canal stenosis. Electronically Signed   By: Gilmore GORMAN Molt M.D.   On: 11/28/2023 20:32   CT Lumbar Spine Wo Contrast Result Date: 11/28/2023 CLINICAL DATA:  Myelopathy, acute, lumbar spine. EXAM: CT LUMBAR SPINE WITHOUT CONTRAST TECHNIQUE: Multidetector CT imaging of the lumbar spine was performed without intravenous contrast administration. Multiplanar CT image reconstructions were also generated. RADIATION DOSE REDUCTION: This exam was performed according to the departmental dose-optimization program which includes automated exposure control, adjustment of the mA and/or kV according to patient size and/or use of iterative reconstruction technique. COMPARISON:  CT abdomen/pelvis 10/29/2022. FINDINGS: Segmentation: 5 non  rib-bearing lumbar vertebral bodies Alignment: Similar grade 1 anterolisthesis of L3 on L4 and L4 on L5. Vertebrae: Similar L1 chronic compression fracture. No evidence of acute fracture. Paraspinal and other soft tissues: Aorto bi-iliac calcific atherosclerosis. Disc levels: Probably at least moderate and potentially severe canal stenosis at L3-L4 and L4-L5. IMPRESSION: 1. Similar L1 chronic compression fracture. 2. No evidence of acute fracture. Similar grade 1 anterolisthesis of L3 on L4 and L4 on L5. 3. Probably at least moderate and potentially severe canal and subarticular recess stenosis at L3-L4 and L4-L5. An MRI could better assess the canal and foramina if clinically warranted. Electronically Signed   By: Gilmore GORMAN Molt M.D.   On: 11/28/2023 20:21   DG Chest Portable 1 View Result Date: 11/28/2023 CLINICAL DATA:  weak EXAM: PORTABLE CHEST - 1 VIEW COMPARISON:  10/28/2022 FINDINGS: No focal airspace consolidation, pleural effusion, or pneumothorax. Borderline cardiomegaly. Aortic atherosclerosis. Osteopenia. No acute fracture or destructive lesions. Multilevel thoracic osteophytosis. IMPRESSION: No acute cardiopulmonary abnormality. Electronically Signed   By: Rogelia Myers M.D.   On: 11/28/2023 17:22    Microbiology: Results for orders placed or performed during the hospital encounter of 11/28/23  Resp panel by RT-PCR (RSV, Flu A&B, Covid) Anterior Nasal Swab     Status: None   Collection Time: 11/28/23  4:48 PM   Specimen: Anterior Nasal Swab  Result Value Ref Range Status   SARS Coronavirus 2 by RT PCR NEGATIVE NEGATIVE Final    Comment: (NOTE) SARS-CoV-2 target nucleic acids are NOT DETECTED.  The SARS-CoV-2 RNA is generally detectable in upper respiratory specimens during the acute phase of infection. The lowest concentration of SARS-CoV-2 viral copies this assay can detect is 138 copies/mL. A negative result does not preclude SARS-Cov-2 infection and should not be used as the  sole basis for treatment or other patient management decisions. A negative result may occur with  improper specimen collection/handling, submission of specimen other than nasopharyngeal swab, presence of viral mutation(s) within the areas targeted by this assay, and inadequate number of viral copies(<138 copies/mL). A negative result must be combined with clinical observations, patient history, and epidemiological information. The expected result is Negative.  Fact Sheet for Patients:  BloggerCourse.com  Fact Sheet for Healthcare Providers:  SeriousBroker.it  This test is no t yet approved or cleared by the [place]  FDA and  has been authorized for detection and/or diagnosis of SARS-CoV-2 by FDA under an Emergency Use Authorization (EUA). This EUA will remain  in effect (meaning this test can be used) for the duration of the COVID-19 declaration under Section 564(b)(1) of the Act, 21 U.S.C.section 360bbb-3(b)(1), unless the authorization is terminated  or revoked sooner.       Influenza A by PCR NEGATIVE NEGATIVE Final   Influenza B by PCR NEGATIVE NEGATIVE Final    Comment: (NOTE) The Xpert Xpress SARS-CoV-2/FLU/RSV plus assay is intended as an aid in the diagnosis of influenza from Nasopharyngeal swab specimens and should not be used as a sole basis for treatment. Nasal washings and aspirates are unacceptable for Xpert  Xpress SARS-CoV-2/FLU/RSV testing.  Fact Sheet for Patients: BloggerCourse.com  Fact Sheet for Healthcare Providers: SeriousBroker.it  This test is not yet approved or cleared by the [place]  FDA and has been authorized for detection and/or diagnosis of SARS-CoV-2 by FDA under an Emergency Use Authorization (EUA). This EUA will remain in effect (meaning this test can be used) for the duration of the COVID-19 declaration under Section 564(b)(1) of the  Act, 21 U.S.C. section 360bbb-3(b)(1), unless the authorization is terminated or revoked.     Resp Syncytial Virus by PCR NEGATIVE NEGATIVE Final    Comment: (NOTE) Fact Sheet for Patients: BloggerCourse.com  Fact Sheet for Healthcare Providers: SeriousBroker.it  This test is not yet approved or cleared by the [place]  FDA and has been authorized for detection and/or diagnosis of SARS-CoV-2 by FDA under an Emergency Use Authorization (EUA). This EUA will remain in effect (meaning this test can be used) for the duration of the COVID-19 declaration under Section 564(b)(1) of the Act, 21 U.S.C. section 360bbb-3(b)(1), unless the authorization is terminated or revoked.  Performed at [facility], [address], [place], [place] [address]     Labs: CBC: Recent Labs  Lab 11/28/23 1650 11/29/23 0650  WBC 13.6* 9.2  HGB 12.5 11.0*  HCT 37.0 32.6*  MCV 121.7* 118.1*  PLT 482* 429*   Basic Metabolic Panel: Recent Labs  Lab 11/28/23 1650 11/29/23 0650  NA 137 138  K 3.6 3.4*  CL 103 108  CO2 22 22  GLUCOSE 108* 82  BUN 12 10  CREATININE 0.62 0.62  CALCIUM 9.3 8.7*   Liver Function Tests: Recent Labs  Lab 11/28/23 1650  AST 42*  ALT 25  ALKPHOS 119  BILITOT 0.8  PROT 7.9  ALBUMIN 4.3   CBG: Recent Labs  Lab 11/28/23 1616  GLUCAP 99    Discharge time spent: approximately 45 minutes spent on discharge counseling, evaluation of patient on day of discharge, and coordination of discharge planning with nursing, social work, pharmacy and case management  Signed: Lonni SHAUNNA Dalton, MD [facility] 12/02/2023

## 2023-12-02 NOTE — Progress Notes (Signed)
 Patient in lots of pain to get to wheel chair, not standing up straight and legs bent.  Unable stand upright hard to move feet to pivot to wheel chaire  I was doing over 50% of the work keeping her up.  12:53  Patient returned to room and unable to hold weight then returnin to chair  patient plopped into chair with assistance  not holding weight well saying that pain keeps her from standing for long or standing straight

## 2023-12-02 NOTE — Progress Notes (Signed)
 Physical Therapy Treatment Patient Details Name: Gabrielle Donovan MRN: [id number] DOB: 1945/03/29 Today's Date: 12/02/2023   History of Present Illness Gabrielle Donovan is an 78 yo female who presented 11/28/23 with progressive L sided weakness. Imaging revealed C1 cervical spinal stenosis, NSGY  consulted and suggested near-future surgery. PMHx:  OA, essential HTN, right sided low back pain, and polycythemia.    PT Comments  Pt greeted seated in recliner chair, pleasant and agreeable to PT session. She takes increased time to complete all functional mobility. Pt ambulated ~30ft twice using RW with minA and a close chair follow. She demonstrated multiple gait abnormalities with inability to adapt despite multi-modal cueing. Pt has limited understanding regarding the level of assistance she need with mobility and poor safety awareness. PT facilitated seated rest breaks as pt fatigued and a developed posterior lean. Educated pt on stair training in accordance with her home set-up. She reports having bilateral handrails, which she denied yesterday. Pt ascended with minA and descended with modA. She took laborious steps with heavy reliance on BUE support and posterior lean. Pt without 24/7 assist available, her niece works during the day and has back issues. Given pt's CLOF, limited family support, and high fall risk recommend continued inpatient follow up therapy, <3 hours/day.       If plan is discharge home, recommend the following: Two people to help with walking and/or transfers;A lot of help with bathing/dressing/bathroom;Assistance with cooking/housework;Assist for transportation;Help with stairs or ramp for entrance   Can travel by private vehicle     Yes  Equipment Recommendations  None recommended by PT (Pt already has DME)    Recommendations for Other Services       Precautions / Restrictions Precautions Precautions: Fall Recall of Precautions/Restrictions: Impaired Restrictions Weight  Bearing Restrictions Per Provider Order: No     Mobility  Bed Mobility               General bed mobility comments: Not assessed. Pt greeted seated in recliner chair and returned there at end of session.    Transfers Overall transfer level: Needs assistance Equipment used: Rolling walker (2 wheels) Transfers: Sit to/from Stand Sit to Stand: Mod assist           General transfer comment: Pt stood from recliner chair. Instructed her to scoot fwd to edge of surface. Cued proper hand placement using RW. Pt opted to place LUE on RW cross bar and RUE on arm rest. She slowly rose up with increased time to acheive standing. Pt demonstrated excessive fwd lean, genu valgum, and maintained B knee flex. Cues for improved posture with emphasis on chest up, hip tuck, hip ext. Pt unable to correct. Fair to Poor eccentric control as pt fatigued. She would plop down on surface. Cued pt to reach back for armrest.    Ambulation/Gait Ambulation/Gait assistance: Min assist, +2 safety/equipment (close chair follow) Gait Distance (Feet): 12 Feet (x2) Assistive device: Rolling walker (2 wheels) Gait Pattern/deviations: Step-to pattern, Decreased step length - right, Decreased step length - left, Knee flexed in stance - right, Knee flexed in stance - left, Trunk flexed, Narrow base of support Gait velocity: decreased     General Gait Details: Pt maintained excessive fwd lean and B knee flex at all times. She has a kyphotic posture and genu valgum. Pt took small, slow steps with heavy reliance on BUE support. Cued improve posture, increase BOS to shoulder width, and increase step length. Pt unable to correct despite multi-modal  cues. She was unsteady with noted postural sway, but no LOB. PT facilitated seated rest breaks as pt was evidently fatiguing. Witnessed increase knee flex and posterior lean with pt note reporting need to rest. Close chair follow needed and provided. Discussed with pt need to  primarily using w/c for mobility currently. She verbalized understanding. Educated pt that if she were to go home and walk she would need +2 assist one person at her side and the other providing a close chair follow using w/c.   Stairs Stairs: Yes Stairs assistance: Min assist, Mod assist Stair Management: Two rails, Forwards, Step to pattern Number of Stairs: 3 (small) General stair comments: Educated pt to ascend/descend one step at a time. She was able to go up with heavy reliance on BUE support on railings and slight posterior lean. Pt went down leading with LLE. When advancing LLE she demonstrated excessive hip ADD and would be at an angle. Able to straighten herself out with cues once both feet on the same step. ModA for safety/stability during descent. Educated pt that if she were to go home and enter/exit her home she would need +2 assist one person in front and behind her.   Wheelchair Mobility     Tilt Bed    Modified Rankin (Stroke Patients Only)       Balance Overall balance assessment: Needs assistance Sitting-balance support: Feet supported, Bilateral upper extremity supported Sitting balance-Leahy Scale: Fair   Postural control: Posterior lean Standing balance support: Bilateral upper extremity supported, During functional activity, Reliant on assistive device for balance Standing balance-Leahy Scale: Poor Standing balance comment: Pt dependent on RW, and post support on back from therapist                            Communication Communication Communication: No apparent difficulties  Cognition Arousal: Alert Behavior During Therapy: WFL for tasks assessed/performed   PT - Cognitive impairments: No family/caregiver present to determine baseline, Problem solving, Safety/Judgement                       PT - Cognition Comments: Pt with decreased insight into current health situation and level of assistance needed. She reports needing to wake  up her legs prior to mobility. Educated pt that her weakness is associated with cervical myelopathy. She demonstrated poor safety awareness. Requires PT to facilitate seated rest breaks based on pt'e evident signs of fatigue. Following commands: Intact      Cueing Cueing Techniques: Verbal cues  Exercises      General Comments        Pertinent Vitals/Pain Pain Assessment Pain Assessment: No/denies pain    Home Living                          Prior Function            PT Goals (current goals can now be found in the care plan section) Acute Rehab PT Goals Patient Stated Goal: Return Home Progress towards PT goals: Progressing toward goals    Frequency    Min 2X/week      PT Plan      Co-evaluation              AM-PAC PT 6 Clicks Mobility   Outcome Measure  Help needed turning from your back to your side while in a flat bed without using bedrails?: A Lot Help  needed moving from lying on your back to sitting on the side of a flat bed without using bedrails?: A Lot Help needed moving to and from a bed to a chair (including a wheelchair)?: A Lot Help needed standing up from a chair using your arms (e.g., wheelchair or bedside chair)?: A Lot Help needed to walk in hospital room?: Total Help needed climbing 3-5 steps with a railing? : A Lot 6 Click Score: 11    End of Session Equipment Utilized During Treatment: Gait belt Activity Tolerance: Patient tolerated treatment well Patient left: in chair;with call bell/phone within reach;with chair alarm set Nurse Communication: Mobility status PT Visit Diagnosis: Muscle weakness (generalized) (M62.81);Difficulty in walking, not elsewhere classified (R26.2);Unsteadiness on feet (R26.81)     Time: 8891-8867 PT Time Calculation (min) (ACUTE ONLY): 24 min  Charges:    $Gait Training: 23-37 mins PT General Charges $$ ACUTE PT VISIT: 1 Visit                     Randall SAUNDERS, PT, DPT Acute Rehabilitation  Services Office: [phone] Secure Chat Preferred  Delon CHRISTELLA Callander 12/02/2023, 2:19 PM

## 2023-12-02 NOTE — TOC Progression Note (Addendum)
 Transition of Care [facility]) - Progression Note    Patient Details  Name: Gabrielle Donovan MRN: [id number] Date of Birth: 14-Jan-1946  Transition of Care [facility]) CM/SW Contact  Luann SHAUNNA [place], [place] Phone Number: 12/02/2023, 1:33 PM  Clinical Narrative:     Fl2 completed and bed requests sent in hub. Referrals sent to [place]  and [place] locations. PASRR is pending.   1625: CSW met with pt and provided list of snf bed offers with medicare star ratings. She states she will review with her children before making a decision. Documents uploaded to [facility] for PASRR that is pending.   Inpatient Care Management(ICM) will follow up for SNF choice.   Expected Discharge Plan: Home w Home Health Services (vs SNF) Barriers to Discharge: Other (must enter comment) (SNF/REHAB workup)               Expected Discharge Plan and Services   Discharge Planning Services: CM Consult   Living arrangements for the past 2 months: Single Family Home Expected Discharge Date: 12/02/23                         HH Arranged: PT, OT HH Agency: [facility] Date HH Agency Contacted: 12/02/23 Time HH Agency Contacted: 1151 Representative spoke with at [facility] Agency: [place]   Social Drivers of Health (SDOH) Interventions SDOH Screenings   Food Insecurity: No Food Insecurity (11/29/2023)  Housing: Low Risk  (11/29/2023)  Transportation Needs: No Transportation Needs (11/29/2023)  Utilities: Not At Risk (11/29/2023)  Financial Resource Strain: Low Risk  (11/04/2022)   Received from [facility]  Physical Activity: Inactive (12/28/2021)   Received from [facility]  Social Connections: Moderately Integrated (11/29/2023)  Stress: No Stress Concern Present (12/28/2021)   Received from [facility]  Tobacco Use: Low Risk  (11/28/2023)  Health Literacy: Low Risk  (12/28/2021)   Received from [facility]    Readmission Risk Interventions     No data to display

## 2023-12-02 NOTE — Care Management Important Message (Signed)
 Important Message  Patient Details  Name: Gabrielle Donovan MRN: [id number] Date of Birth: [birth year]   Important Message Given:  Yes - Medicare IM     Jon Cruel 12/02/2023, 10:58 AM

## 2023-12-02 NOTE — NC FL2 (Signed)
 [place]  MEDICAID FL2 LEVEL OF CARE FORM     IDENTIFICATION  Patient Name: Gabrielle Donovan Birthdate: 1946/02/28 Sex: female Admission Date (Current Location): 11/28/2023  [facility] and [place] Number:  Producer, television/film/video and Address:  [facility] [place], [address], [place], [place] [address]      Provider Number: [id number]  Attending Physician Name and Address:  Jonel Lonni SQUIBB, *  Relative Name and Phone Number:  Reghan, Thul (Daughter)  [phone] (Home Phone)    Current Level of Care: Hospital Recommended Level of Care: Skilled Nursing Facility Prior Approval Number:    Date Approved/Denied:   PASRR Number: Pending  Discharge Plan: SNF    Current Diagnoses: Patient Active Problem List   Diagnosis Date Noted   Hypothyroidism 11/29/2023   Cervical myelopathy (HCC) 11/28/2023   Splenic abscess 10/09/2022   Sepsis (HCC) 10/09/2022   Hypokalemia 10/09/2022   Acute hyponatremia 10/09/2022   Polycythemia vera (HCC) 10/09/2022   Essential hypertension 10/09/2022   Acquired hypothyroidism 10/09/2022   Spondylosis, cervical, with myelopathy 01/18/2019   Expected blood loss anemia 08/01/2013   Overweight (BMI 25.0-29.9) 08/01/2013   S/P right THA, 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    Orientation RESPIRATION BLADDER Height & Weight     Self, Time, Situation, Place  Normal Continent Weight: 126 lb (57.2 kg) Height:  5' 6 (167.6 cm)  BEHAVIORAL SYMPTOMS/MOOD NEUROLOGICAL BOWEL NUTRITION STATUS      Continent Diet (see d/c summary)  AMBULATORY STATUS COMMUNICATION OF NEEDS Skin   Extensive Assist Verbally Normal                       Personal Care Assistance Level of Assistance  Bathing, Feeding, Dressing Bathing Assistance: Maximum assistance Feeding assistance: Independent Dressing Assistance: Limited assistance     Functional Limitations Info  Sight, Hearing, Speech Sight Info: Impaired Hearing Info: Adequate Speech Info: Adequate     SPECIAL CARE FACTORS FREQUENCY  PT (By licensed PT), OT (By licensed OT)     PT Frequency: 5x/week OT Frequency: 5x/week            Contractures Contractures Info: Not present    Additional Factors Info  Code Status, Allergies Code Status Info: DNR Allergies Info: codeine, Hydrocodone            Current Medications (12/02/2023):  This is the current hospital active medication list Current Facility-Administered Medications  Medication Dose Route Frequency Provider Last Rate Last Admin   acetaminophen  (TYLENOL ) tablet 650 mg  650 mg Oral Q6H PRN Mansy, Jan A, MD   650 mg at 12/01/23 2114   Or   acetaminophen  (TYLENOL ) suppository 650 mg  650 mg Rectal Q6H PRN Mansy, Jan A, MD       brimonidine  (ALPHAGAN ) 0.2 % ophthalmic solution 1 drop  1 drop Right Eye QHS Mansy, Jan A, MD   1 drop at 12/01/23 2141   carvedilol  (COREG ) tablet 3.125 mg  3.125 mg Oral BID Lama, Gagan S, MD   3.125 mg at 12/02/23 0855   chlorthalidone  (HYGROTON ) tablet 25 mg  25 mg Oral Daily Danford, Christopher P, MD   25 mg at 12/02/23 0856   folic acid  (FOLVITE ) tablet 1 mg  1 mg Oral Daily Mansy, Jan A, MD   1 mg at 12/02/23 9145   hydroxyurea  (HYDREA ) capsule 500 mg  500 mg Oral QID Lama, Gagan S, MD   500 mg at 12/02/23 1018   levothyroxine  (SYNTHROID ) tablet 50 mcg  50 mcg Oral QODAY Mansy, Jan A, MD   50 mcg at 12/01/23 0944   levothyroxine  (SYNTHROID ) tablet 75 mcg  75 mcg Oral QODAY Mansy, Jan A, MD   75 mcg at 12/02/23 1024   loratadine  (CLARITIN ) tablet 10 mg  10 mg Oral Daily Mansy, Jan A, MD   10 mg at 12/02/23 9144   magnesium  hydroxide (MILK OF MAGNESIA) suspension 30 mL  30 mL Oral Daily PRN Mansy, Jan A, MD       ondansetron  (ZOFRAN ) tablet 4 mg  4 mg Oral Q6H PRN Mansy, Jan A, MD       Or   ondansetron  (ZOFRAN ) injection 4 mg  4 mg Intravenous Q6H PRN Mansy, Jan A, MD       oxyCODONE  (Oxy IR/ROXICODONE ) immediate release tablet 5 mg  5 mg Oral Q6H PRN Mansy, Jan A, MD   5 mg at 12/01/23  1840   traZODone  (DESYREL ) tablet 25 mg  25 mg Oral QHS PRN Mansy, Jan A, MD   25 mg at 12/01/23 2114     Discharge Medications: Please see discharge summary for a list of discharge medications.  Relevant Imaging Results:  Relevant Lab Results:   Additional Information SSN [id number] [address] [place], [place]

## 2023-12-02 NOTE — Progress Notes (Signed)
       RE:  Gabrielle Donovan   Date of Birth:  27-Dec-1945  Date:   12/02/2023    To Whom It May Concern:  Please be advised that the above-named patient will require a short-term nursing home stay - anticipated 30 days or less for rehabilitation and strengthening.  The plan is for return home.

## 2023-12-02 NOTE — TOC Initial Note (Signed)
 Transition of Care [facility]) - Initial/Assessment Note    Patient Details  Name: MACEL YEARSLEY MRN: [id number] Date of Birth: 1945-11-26  Transition of Care [facility]) CM/SW Contact:    Rosalva Jon Bloch, RN Phone Number: 12/02/2023, 1:17 PM  Clinical Narrative:                 Presented with L sided weakness, cervical myelopathy. Pt from home with niece who works during the day and have back issues. Pt without  24/7 assistance, high risk for falls. Per therapy pt needS a lot of help with walking and transfers. States the support needs to be present during transfers and ambulation.  24/7 assist/support because she will be transferring and moving around during the day/night from one surface to another and is not safe completing this on her own. After long discussion with MD, RNCM  and daughter  Earnie on speaker phone pt agreeable to  SNF/ REHAB ST. Patient without preference for SNF/REHAB.  RNCM discussed insurance authorization process and provided Medicare SNF ratings list. Patient hopeful for rehab and to feel better soon.    Home health services are in place with [facility] if needed as a back if changes occur and family can provide the needed support.   No further questions reported at this time. RNCM to continue to follow and assist with discharge planning needs. Pt with BSC and RW @ home.  Expected Discharge Plan: Home w Home Health Services (vs SNF) Barriers to Discharge: Other (must enter comment) (SNF/REHAB workup)   Patient Goals and CMS Choice   CMS Medicare.gov Compare Post Acute Care list provided to:: Patient Choice offered to / list presented to : Patient      Expected Discharge Plan and Services   Discharge Planning Services: CM Consult   Living arrangements for the past 2 months: Single Family Home Expected Discharge Date: 12/02/23                         HH Arranged: PT, OT HH Agency: [facility] Date HH Agency Contacted: 12/02/23 Time HH  Agency Contacted: 1151 Representative spoke with at [facility] Agency: [place]  Prior Living Arrangements/Services Living arrangements for the past 2 months: Single Family Home Lives with:: Spouse Patient language and need for interpreter reviewed:: Yes Do you feel safe going back to the place where you live?: Yes      Need for Family Participation in Patient Care: Yes (Comment) Care giver support system in place?: No (comment) Current home services: DME (RW,BSC) Criminal Activity/Legal Involvement Pertinent to Current Situation/Hospitalization: No - Comment as needed  Activities of Daily Living   ADL Screening (condition at time of admission) Independently performs ADLs?: Yes (appropriate for developmental age) Is the patient deaf or have difficulty hearing?: No Does the patient have difficulty seeing, even when wearing glasses/contacts?: No Does the patient have difficulty concentrating, remembering, or making decisions?: No  Permission Sought/Granted      Share Information with NAME: Ottilia Pippenger  Daughter  Emergency Contact  [phone] ,  Dee Jerry  Emergency Contact  [phone]           Emotional Assessment       Orientation: : Oriented to Self, Oriented to Place, Oriented to  Time, Oriented to Situation Alcohol  / Substance Use: Not Applicable Psych Involvement: No (comment)  Admission diagnosis:  Cervical myelopathy (HCC) [G95.9] Left leg weakness [R29.898] Left arm weakness [R29.898] Patient Active Problem List  Diagnosis Date Noted   Hypothyroidism 11/29/2023   Cervical myelopathy (HCC) 11/28/2023   Splenic abscess 10/09/2022   Sepsis (HCC) 10/09/2022   Hypokalemia 10/09/2022   Acute hyponatremia 10/09/2022   Polycythemia vera (HCC) 10/09/2022   Essential hypertension 10/09/2022   Acquired hypothyroidism 10/09/2022   Spondylosis, cervical, with myelopathy 01/18/2019   Expected blood loss anemia 08/01/2013   Overweight (BMI 25.0-29.9) 08/01/2013   S/P  right THA, 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   PCP:  Lari Elspeth BRAVO, MD Pharmacy:   [facility] [phone] - [place], [place] - [address] AT [address] [place] [address] Phone: [phone] Fax: [phone]  [id number] - [place], [place] - [address] [place] [address] Phone: [phone] Fax: [phone]  [facility] [address] [place] [address] Phone: [phone] Fax: [phone]     Social Drivers of Health (SDOH) Social History: SDOH Screenings   Food Insecurity: No Food Insecurity (11/29/2023)  Housing: Low Risk  (11/29/2023)  Transportation Needs: No Transportation Needs (11/29/2023)  Utilities: Not At Risk (11/29/2023)  Financial Resource Strain: Low Risk  (11/04/2022)   Received from [facility]  Physical Activity: Inactive (12/28/2021)   Received from [facility]  Social Connections: Moderately Integrated (11/29/2023)  Stress: No Stress Concern Present (12/28/2021)   [facility] from [facility]  Tobacco Use: Low Risk  (11/28/2023)  Health Literacy: Low Risk  (12/28/2021)   Received from [facility]   SDOH Interventions:     Readmission Risk Interventions     No data to display

## 2023-12-02 NOTE — Progress Notes (Signed)
 Patient may not be discharging  concern for safety at home due to not having someone at home for safety

## 2023-12-02 NOTE — Progress Notes (Signed)
 Occupational Therapy Treatment Patient Details Name: Gabrielle Donovan MRN: [id number] DOB: 1945/04/13 Today's Date: 12/02/2023   History of present illness Gabrielle Donovan is an 78 yo female who presented 11/28/23 with progressive L sided weakness. Imaging revealed C1 cervical spinal stenosis, NSGY  consulted and suggested near-future surgery. PMHx:  OA, essential HTN, right sided low back pain, and polycythemia.   OT comments  Pt. Seen for skilled OT treatment session. Pt. Demonstrating great difficulty with sit/stand requiring MOD A with 3 attempts before achieving standing.  Slow gait with little active movement of LLE but cont. So say its due to sitting too long and needing to wake up her legs.  Ambulation to recliner (simulated bsc/toilet transfer) with MAX cues for safety.  Pt. Attempting to sit before reaching surface.  Required physical assistance to prevent fall backwards.  Education on making sure she feels the surface with BLEs before sitting down and reaching back before she sits to feel surface with her hands.  Pt. Reports she has 2 nieces that live with her and will be with her.  If pt. Remains at this required level of assistance for sit/stands, and mobility would need confirmation that 24/7 support is available in order to proceed with current recommendations of home with HH therapies as pt. Is a high fall risk currently.        If plan is discharge home, recommend the following:  A little help with walking and/or transfers;A little help with bathing/dressing/bathroom;Two people to help with bathing/dressing/bathroom;Assist for transportation;Help with stairs or ramp for entrance;Assistance with cooking/housework   Equipment Recommendations  None recommended by OT    Recommendations for Other Services      Precautions / Restrictions Precautions Precautions: Fall Recall of Precautions/Restrictions: Impaired       Mobility Bed Mobility               General bed mobility  comments: seated eob upon arrival and in recliner at end of session    Transfers Overall transfer level: Needs assistance   Transfers: Sit to/from Stand, Bed to chair/wheelchair/BSC Sit to Stand: Mod assist     Step pivot transfers: Mod assist     General transfer comment: multiple attempts for sit/stand, pt. with heavy flexed posture unable to achieve full standing and bring BUEs onto rw. bend forward with LUE on center of RW. max cues for more upright posture to aide in better balance and able to bring BUEs onto RW.  pt. unable with cues.  heavy reliance on therapista asst for back support or pt. would have fallen backwards.  cont. to say she was stiff and needed to wake her legs up, cont. to stomp and shake RLE.  slow gait to recliner, almost dragging LLE.  attempting to sit before full pivot to recliner. max verbal and physical assistance to stop pt. from attempted sit.  reviewed at length backing all the way up to a surface and feeling her legs on it and then reaching for arm rest prior to sitting.  pt. able to recall these steps x2 different times before session ended.     Balance Overall balance assessment: Needs assistance Sitting-balance support: Feet supported Sitting balance-Leahy Scale: Fair       Standing balance-Leahy Scale: Poor Standing balance comment: Pt dependent on RW, and post support on back from therapist asst  ADL either performed or assessed with clinical judgement   ADL Overall ADL's : Needs assistance/impaired                     Lower Body Dressing: Set up;Sitting/lateral leans Lower Body Dressing Details (indicate cue type and reason): cues for figure 4 for safety, reviewed not bending forward as it is a fall risk Toilet Transfer: Minimal assistance;Ambulation;Rolling walker (2 wheels);Cueing for safety;Cueing for sequencing Toilet Transfer Details (indicate cue type and reason): eob with ambulation to  recliner         Functional mobility during ADLs: Minimal assistance;Rolling walker (2 wheels) General ADL Comments: min A for step pivot transfers, pt unsteady in steady and has difficulty managing LLE    Extremity/Trunk Assessment              Vision       Perception     Praxis     Communication Communication Communication: No apparent difficulties   Cognition Arousal: Alert Behavior During Therapy: WFL for tasks assessed/performed Cognition: No family/caregiver present to determine baseline             OT - Cognition Comments: limited insight into deficits and fall risk, asking multiple times if i knew when the dr. was coming after i had already answered her                 Following commands: Intact        Cueing   Cueing Techniques: Verbal cues  Exercises      Shoulder Instructions       General Comments      Pertinent Vitals/ Pain       Pain Assessment Pain Assessment: No/denies pain  Home Living                                          Prior Functioning/Environment              Frequency  Min 2X/week        Progress Toward Goals  OT Goals(current goals can now be found in the care plan section)  Progress towards OT goals: Progressing toward goals     Plan      Co-evaluation                 AM-PAC OT 6 Clicks Daily Activity     Outcome Measure   Help from another person eating meals?: None Help from another person taking care of personal grooming?: A Little Help from another person toileting, which includes using toliet, bedpan, or urinal?: A Little Help from another person bathing (including washing, rinsing, drying)?: A Little Help from another person to put on and taking off regular upper body clothing?: A Little Help from another person to put on and taking off regular lower body clothing?: A Little 6 Click Score: 19    End of Session Equipment Utilized During Treatment: Gait  belt;Rolling walker (2 wheels)  OT Visit Diagnosis: Unsteadiness on feet (R26.81);Other abnormalities of gait and mobility (R26.89);Muscle weakness (generalized) (M62.81);History of falling (Z91.81)   Activity Tolerance Patient tolerated treatment well   Patient Left in chair;with call bell/phone within reach;with chair alarm set   Nurse Communication Other (comment) (rn states ok to work with pt.)        Time: 9095-9078 OT Time Calculation (min): 17 min  Charges: OT General  Charges $OT Visit: 1 Visit OT Treatments $Self Care/Home Management : 8-22 mins  Randall, COTA/L Acute Rehabilitation [phone]   CHRISTELLA Nest Lorraine-COTA/L  12/02/2023, 9:33 AM

## 2023-12-03 DIAGNOSIS — G959 Disease of spinal cord, unspecified: Secondary | ICD-10-CM | POA: Diagnosis not present

## 2023-12-03 LAB — BASIC METABOLIC PANEL WITH GFR
Anion gap: 10 (ref 5–15)
BUN: 16 mg/dL (ref 8–23)
CO2: 22 mmol/L (ref 22–32)
Calcium: 8.9 mg/dL (ref 8.9–10.3)
Chloride: 103 mmol/L (ref 98–111)
Creatinine, Ser: 0.77 mg/dL (ref 0.44–1.00)
GFR, Estimated: >60 mL/min (ref >60)
Glucose, Bld: 94 mg/dL (ref 70–99)
Potassium: 3.8 mmol/L (ref 3.5–5.1)
Sodium: 135 mmol/L (ref 135–145)

## 2023-12-03 LAB — CBC
HCT: 31.1 % — ABNORMAL LOW (ref 36.0–46.0)
Hemoglobin: 10.7 g/dL — ABNORMAL LOW (ref 12.0–15.0)
MCH: 39.9 pg — ABNORMAL HIGH (ref 26.0–34.0)
MCHC: 34.4 g/dL (ref 30.0–36.0)
MCV: 116 fL — ABNORMAL HIGH (ref 80.0–100.0)
Platelets: 545 K/uL — ABNORMAL HIGH (ref 150–400)
RBC: 2.68 MIL/uL — ABNORMAL LOW (ref 3.87–5.11)
RDW: 25 % — ABNORMAL HIGH (ref 11.5–15.5)
WBC: 12.8 K/uL — ABNORMAL HIGH (ref 4.0–10.5)
nRBC: 0.9 % — ABNORMAL HIGH (ref 0.0–0.2)

## 2023-12-03 NOTE — Progress Notes (Signed)
  Progress Note   Patient: Gabrielle Donovan FMW:[id number] DOB: June 18, 1945 DOA: 11/28/2023     5 DOS: the patient was seen and examined on 12/03/2023        Brief hospital course: 78 y.o. F with polycythemia, HTN, degenerative spine disease presented with progressive left sided weakness.  Imaging revealed C1 cervical spinal stenosis.  NSGY consulted.     Assessment and Plan: * Cervical myelopathy (HCC) PT/OT Plan for surgery  Hypothyroidism - Continue levothyroxine   Essential hypertension BP normal - Continue Coreg , chlorthalidone   Polycythemia vera (HCC) Mild leukocytosis, mild anemia, mild thrombocytosis. - Continue hydroxyurea  - No longer on Ojjaara          Subjective: No new complaints, no nursing concerns     Physical Exam: BP (!) 129/37 (BP Location: Left Arm)   Pulse 92   Temp 98.7 F (37.1 C) (Oral)   Resp 18   Ht 5' 6 (1.676 m)   Wt 57.2 kg   SpO2 99%   BMI 20.34 kg/m   Adult female, lying in bed, talking on the phone RRR, no murmurs, no peripheral edema Respiratory normal, lungs clear without rales or wheezes 1+ pitting in right leg, trace on left. Left leg weakness 4/5 5-/5 on right.  Left arm slight weakness/ataxia  Data Reviewed: BMP normal CBC as outlined above    Family Communication:     Disposition: Status is: Inpatient         Author: Lonni SHAUNNA Dalton, MD 12/03/2023 1:20 PM  For on call review [email or web address].

## 2023-12-03 NOTE — Plan of Care (Signed)

## 2023-12-03 NOTE — TOC Progression Note (Addendum)
 Transition of Care [facility]) - Progression Note    Patient Details  Name: Gabrielle Donovan MRN: [id number] Date of Birth: 23-Oct-1945  Transition of [facility] CM/SW Contact  Isaiah Public, LCSWA Phone Number: 12/03/2023, 2:55 PM  Clinical Narrative:     CSW spoke with patient and provided SNF bed offers. Patient accepted SNF bed offer with [facility]. CSW informed Donald with [employer]. CSW following to start insurance authorization closer to patient being medically ready.  Update- Patients passr has been approved [id number].    Expected Discharge Plan: Home w Home Health Services (vs SNF) Barriers to Discharge: Other (must enter comment) (SNF/REHAB workup)               Expected Discharge Plan and Services   Discharge Planning Services: CM Consult   Living arrangements for the past 2 months: Single Family Home Expected Discharge Date: 12/02/23                         HH Arranged: PT, OT HH Agency: [facility] Date HH Agency Contacted: 12/02/23 Time HH Agency Contacted: 1151 Representative spoke with at [facility] Agency: [place]   Social Drivers of Health (SDOH) Interventions SDOH Screenings   Food Insecurity: No Food Insecurity (11/29/2023)  Housing: Low Risk  (11/29/2023)  Transportation Needs: No Transportation Needs (11/29/2023)  Utilities: Not At Risk (11/29/2023)  Financial Resource Strain: Low Risk  (11/04/2022)   Received from [facility]  Physical Activity: Inactive (12/28/2021)   Received from [facility]  Social Connections: Moderately Integrated (11/29/2023)  Stress: No Stress Concern Present (12/28/2021)   Received from [facility]  Tobacco Use: Low Risk  (11/28/2023)  Health Literacy: Low Risk  (12/28/2021)   Received from [facility]    Readmission Risk Interventions     No data to display

## 2023-12-04 DIAGNOSIS — G959 Disease of spinal cord, unspecified: Secondary | ICD-10-CM | POA: Diagnosis not present

## 2023-12-04 NOTE — Progress Notes (Signed)
  Progress Note   Patient: Gabrielle Donovan FMW:[id number] DOB: 03-Jan-1946 DOA: 11/28/2023     6 DOS: the patient was seen and examined on 12/04/2023        Brief hospital course: 78 y.o. F with polycythemia, HTN, degenerative spine disease presented with progressive left sided weakness.  Imaging revealed C1 cervical spinal stenosis.  NSGY consulted.     Assessment and Plan: * Cervical myelopathy (HCC) PT/OT Plan for surgery  Hypothyroidism - Continue levothyroxine   Essential hypertension BP normal - Continue Coreg , chlorthalidone   Polycythemia vera (HCC) Mild leukocytosis, mild anemia, mild thrombocytosis. - Continue hydroxyurea  - No longer on Ojjaara          Subjective: No new complaints, no nursing concerns     Physical Exam: BP (!) 119/57 (BP Location: Left Arm)   Pulse 92   Temp 99.1 F (37.3 C) (Oral)   Resp 18   Ht 5' 6 (1.676 m)   Wt 57.2 kg   SpO2 97%   BMI 20.34 kg/m   Adult female, in bed, on phone RRR no murmurs, no pitting in leg today Respiratory rate normal, lungs clear Abdomen soft, no TTP   Data Reviewed:   Family Communication:     Disposition: Status is: Inpatient         Author: Lonni SHAUNNA Dalton, MD 12/04/2023 11:24 AM  For on call review [email or web address].

## 2023-12-04 NOTE — Plan of Care (Signed)

## 2023-12-04 NOTE — Anesthesia Preprocedure Evaluation (Signed)
 Anesthesia Evaluation  Patient identified by MRN, date of birth, ID band Patient awake    Reviewed: Allergy & Precautions, NPO status , Patient's Chart, lab work & pertinent test results, reviewed documented beta blocker date and time   Airway Mallampati: II  TM Distance: >3 FB Neck ROM: Full    Dental no notable dental hx. (+) Edentulous Upper, Edentulous Lower   Pulmonary neg pulmonary ROS   Pulmonary exam normal breath sounds clear to auscultation       Cardiovascular hypertension, Pt. on medications Normal cardiovascular exam+ Valvular Problems/Murmurs (Mod PI) MR  Rhythm:Regular Rate:Normal  Echo 09/2022  1. Left ventricular ejection fraction, by estimation, is 60 to 65%. The  left ventricle has normal function. The left ventricle has no regional  wall motion abnormalities. Left ventricular diastolic parameters are  consistent with Grade I diastolic dysfunction (impaired relaxation).   2. Right ventricular systolic function is normal. The right ventricular  size is normal.   3. The mitral valve is normal in structure. Mild mitral valve  regurgitation. No evidence of mitral stenosis.   4. The aortic valve is normal in structure. Aortic valve regurgitation is  not visualized. No aortic stenosis is present.   5. Pulmonic valve regurgitation is moderate.   6. The inferior vena cava is normal in size with greater than 50%  respiratory variability, suggesting right atrial pressure of 3 mmHg.     Neuro/Psych  Headaches  negative psych ROS   GI/Hepatic negative GI ROS, Neg liver ROS,,,  Endo/Other  Hypothyroidism    Renal/GU negative Renal ROS     Musculoskeletal  (+) Arthritis ,    Abdominal   Peds  Hematology  (+) Blood dyscrasia, anemia   Anesthesia Other Findings Cervical myelopathy  Reproductive/Obstetrics                              Anesthesia Physical Anesthesia Plan  ASA:  3  Anesthesia Plan: General   Post-op Pain Management: Tylenol  PO (pre-op)*   Induction: Intravenous  PONV Risk Score and Plan: 4 or greater and Dexamethasone , Ondansetron  and Treatment may vary due to age or medical condition  Airway Management Planned: Oral ETT and Video Laryngoscope Planned  Additional Equipment: Arterial line  Intra-op Plan:   Post-operative Plan: Possible Post-op intubation/ventilation  Informed Consent: I have reviewed the patients History and Physical, chart, labs and discussed the procedure including the risks, benefits and alternatives for the proposed anesthesia with the patient or authorized representative who has indicated his/her understanding and acceptance.   Patient has DNR.   Dental advisory given  Plan Discussed with: CRNA  Anesthesia Plan Comments: (2 x PIV  Per PA-C: Hx of anemia and thrombocytosis (on hydroxyurea ). Stable per preop labs. Copy of CMP from PCP office dated 01/10/19 on pt chart. Reviewed, WNL.  EKG 01/15/19. NSR. Rate 69.)         Anesthesia Quick Evaluation

## 2023-12-05 ENCOUNTER — Inpatient Hospital Stay (HOSPITAL_COMMUNITY): Payer: Self-pay | Admitting: Anesthesiology

## 2023-12-05 ENCOUNTER — Inpatient Hospital Stay (HOSPITAL_COMMUNITY)

## 2023-12-05 ENCOUNTER — Encounter (HOSPITAL_COMMUNITY): Payer: Self-pay | Admitting: Family Medicine

## 2023-12-05 ENCOUNTER — Inpatient Hospital Stay (HOSPITAL_COMMUNITY)
Admission: EM | Disposition: A | Discharge status: Skilled Nursing Facility | Payer: Self-pay | Source: Home / Self Care | Attending: Family Medicine

## 2023-12-05 ENCOUNTER — Other Ambulatory Visit: Payer: Self-pay

## 2023-12-05 DIAGNOSIS — G8194 Hemiplegia, unspecified affecting left nondominant side: Secondary | ICD-10-CM

## 2023-12-05 DIAGNOSIS — I340 Nonrheumatic mitral (valve) insufficiency: Secondary | ICD-10-CM

## 2023-12-05 DIAGNOSIS — I10 Essential (primary) hypertension: Secondary | ICD-10-CM

## 2023-12-05 DIAGNOSIS — M5000 Cervical disc disorder with myelopathy, unspecified cervical region: Secondary | ICD-10-CM

## 2023-12-05 DIAGNOSIS — G959 Disease of spinal cord, unspecified: Secondary | ICD-10-CM | POA: Diagnosis not present

## 2023-12-05 DIAGNOSIS — E039 Hypothyroidism, unspecified: Secondary | ICD-10-CM | POA: Diagnosis not present

## 2023-12-05 DIAGNOSIS — I639 Cerebral infarction, unspecified: Secondary | ICD-10-CM

## 2023-12-05 DIAGNOSIS — T4145XA Adverse effect of unspecified anesthetic, initial encounter: Secondary | ICD-10-CM

## 2023-12-05 HISTORY — PX: POSTERIOR CERVICAL FUSION/FORAMINOTOMY: SHX5038

## 2023-12-05 LAB — BASIC METABOLIC PANEL WITH GFR
Anion gap: 15 (ref 5–15)
BUN: 13 mg/dL (ref 8–23)
CO2: 21 mmol/L — ABNORMAL LOW (ref 22–32)
Calcium: 9.2 mg/dL (ref 8.9–10.3)
Chloride: 98 mmol/L (ref 98–111)
Creatinine, Ser: 0.81 mg/dL (ref 0.44–1.00)
GFR, Estimated: >60 mL/min (ref >60)
Glucose, Bld: 146 mg/dL — ABNORMAL HIGH (ref 70–99)
Potassium: 4 mmol/L (ref 3.5–5.1)
Sodium: 134 mmol/L — ABNORMAL LOW (ref 135–145)

## 2023-12-05 LAB — CBC WITH DIFFERENTIAL/PLATELET
Abs Immature Granulocytes: 0.06 K/uL (ref 0.00–0.07)
Basophils Absolute: 0.1 K/uL (ref 0.0–0.1)
Basophils Relative: 1 %
Eosinophils Absolute: 0 K/uL (ref 0.0–0.5)
Eosinophils Relative: 0 %
HCT: 35.5 % — ABNORMAL LOW (ref 36.0–46.0)
Hemoglobin: 11.9 g/dL — ABNORMAL LOW (ref 12.0–15.0)
Immature Granulocytes: 1 %
Lymphocytes Relative: 13 %
Lymphs Abs: 1.4 K/uL (ref 0.7–4.0)
MCH: 40.1 pg — ABNORMAL HIGH (ref 26.0–34.0)
MCHC: 33.5 g/dL (ref 30.0–36.0)
MCV: 119.5 fL — ABNORMAL HIGH (ref 80.0–100.0)
Monocytes Absolute: 0.3 K/uL (ref 0.1–1.0)
Monocytes Relative: 3 %
Neutro Abs: 9.1 K/uL — ABNORMAL HIGH (ref 1.7–7.7)
Neutrophils Relative %: 82 %
Platelets: 691 K/uL — ABNORMAL HIGH (ref 150–400)
RBC: 2.97 MIL/uL — ABNORMAL LOW (ref 3.87–5.11)
RDW: 24.7 % — ABNORMAL HIGH (ref 11.5–15.5)
WBC: 10.9 K/uL — ABNORMAL HIGH (ref 4.0–10.5)
nRBC: 2.7 % — ABNORMAL HIGH (ref 0.0–0.2)

## 2023-12-05 LAB — HEMOGLOBIN A1C
Hgb A1c MFr Bld: 5.1 % (ref 4.8–5.6)
Mean Plasma Glucose: 99.67 mg/dL

## 2023-12-05 LAB — GLUCOSE, CAPILLARY: Glucose-Capillary: 147 mg/dL — ABNORMAL HIGH (ref 70–99)

## 2023-12-05 LAB — TYPE AND SCREEN
ABO/RH(D): A POS
Antibody Screen: NEGATIVE

## 2023-12-05 LAB — PHOSPHORUS: Phosphorus: 3.8 mg/dL (ref 2.5–4.6)

## 2023-12-05 LAB — MAGNESIUM: Magnesium: 2 mg/dL (ref 1.7–2.4)

## 2023-12-05 LAB — SURGICAL PCR SCREEN
MRSA, PCR: NEGATIVE
Staphylococcus aureus: NEGATIVE

## 2023-12-05 SURGERY — POSTERIOR CERVICAL FUSION/FORAMINOTOMY LEVEL 2
Anesthesia: General | Site: Neck

## 2023-12-05 MED ORDER — CEFAZOLIN SODIUM-DEXTROSE 2-4 GM/100ML-% IV SOLN
2.0000 g | Freq: Three times a day (TID) | INTRAVENOUS | Status: AC
  Administered 2023-12-05 – 2023-12-06 (×2): 2 g via INTRAVENOUS
  Filled 2023-12-05 (×2): qty 100

## 2023-12-05 MED ORDER — LACTATED RINGERS IV SOLN
INTRAVENOUS | Status: DC | PRN
Start: 2023-12-05 — End: 2023-12-05

## 2023-12-05 MED ORDER — HYDROMORPHONE HCL 1 MG/ML IJ SOLN
0.5000 mg | INTRAMUSCULAR | Status: DC | PRN
  Administered 2023-12-05 – 2023-12-06 (×3): 0.5 mg via INTRAVENOUS
  Filled 2023-12-05 (×3): qty 1

## 2023-12-05 MED ORDER — BUPIVACAINE HCL (PF) 0.5 % IJ SOLN
INTRAMUSCULAR | Status: AC
  Filled 2023-12-05: qty 30

## 2023-12-05 MED ORDER — BACITRACIN ZINC 500 UNIT/GM EX OINT
TOPICAL_OINTMENT | CUTANEOUS | Status: DC | PRN
  Administered 2023-12-05: 1 via TOPICAL

## 2023-12-05 MED ORDER — CEFAZOLIN SODIUM-DEXTROSE 2-3 GM-%(50ML) IV SOLR
INTRAVENOUS | Status: DC | PRN
  Administered 2023-12-05: 2 g via INTRAVENOUS

## 2023-12-05 MED ORDER — METHOCARBAMOL 500 MG PO TABS
500.0000 mg | ORAL_TABLET | Freq: Four times a day (QID) | ORAL | Status: DC | PRN
  Administered 2023-12-05 – 2023-12-07 (×6): 500 mg via ORAL
  Filled 2023-12-05 (×6): qty 1

## 2023-12-05 MED ORDER — ACETAMINOPHEN 650 MG RE SUPP: 650.0000 mg | RECTAL | Status: DC | PRN

## 2023-12-05 MED ORDER — IOHEXOL 350 MG/ML SOLN
75.0000 mL | Freq: Once | INTRAVENOUS | Status: AC | PRN
  Administered 2023-12-05: 75 mL via INTRAVENOUS

## 2023-12-05 MED ORDER — DOCUSATE SODIUM 100 MG PO CAPS
100.0000 mg | ORAL_CAPSULE | Freq: Two times a day (BID) | ORAL | Status: DC
  Administered 2023-12-05 – 2023-12-07 (×4): 100 mg via ORAL
  Filled 2023-12-05 (×4): qty 1

## 2023-12-05 MED ORDER — PROPOFOL 10 MG/ML IV BOLUS
INTRAVENOUS | Status: AC
  Filled 2023-12-05: qty 20

## 2023-12-05 MED ORDER — CHLORHEXIDINE GLUCONATE 0.12 % MT SOLN
OROMUCOSAL | Status: AC
  Administered 2023-12-05: 15 mL via OROMUCOSAL
  Filled 2023-12-05: qty 15

## 2023-12-05 MED ORDER — ADHERUS DURAL SEALANT
PACK | TOPICAL | Status: DC | PRN
  Administered 2023-12-05: 1 via TOPICAL

## 2023-12-05 MED ORDER — LIDOCAINE-EPINEPHRINE 1 %-1:100000 IJ SOLN
INTRAMUSCULAR | Status: AC
Start: 2023-12-05 — End: 2023-12-05
  Filled 2023-12-05: qty 1

## 2023-12-05 MED ORDER — SENNA 8.6 MG PO TABS
1.0000 | ORAL_TABLET | Freq: Two times a day (BID) | ORAL | Status: DC
  Administered 2023-12-05 – 2023-12-07 (×3): 8.6 mg via ORAL
  Filled 2023-12-05 (×4): qty 1

## 2023-12-05 MED ORDER — PHENYLEPHRINE HCL-NACL 20-0.9 MG/250ML-% IV SOLN
INTRAVENOUS | Status: DC | PRN
  Administered 2023-12-05: 30 ug/min via INTRAVENOUS

## 2023-12-05 MED ORDER — BISACODYL 10 MG RE SUPP: 10.0000 mg | Freq: Every day | RECTAL | Status: DC | PRN

## 2023-12-05 MED ORDER — LIDOCAINE 2% (20 MG/ML) 5 ML SYRINGE
INTRAMUSCULAR | Status: DC | PRN
  Administered 2023-12-05: 80 mg via INTRAVENOUS

## 2023-12-05 MED ORDER — ROCURONIUM BROMIDE 10 MG/ML (PF) SYRINGE
PREFILLED_SYRINGE | INTRAVENOUS | Status: DC | PRN
  Administered 2023-12-05: 30 mg via INTRAVENOUS
  Administered 2023-12-05 (×2): 50 mg via INTRAVENOUS

## 2023-12-05 MED ORDER — PHENYLEPHRINE 80 MCG/ML (10ML) SYRINGE FOR IV PUSH (FOR BLOOD PRESSURE SUPPORT)
PREFILLED_SYRINGE | INTRAVENOUS | Status: DC | PRN
  Administered 2023-12-05: 160 ug via INTRAVENOUS

## 2023-12-05 MED ORDER — DEXAMETHASONE SODIUM PHOSPHATE 10 MG/ML IJ SOLN
INTRAMUSCULAR | Status: AC
  Filled 2023-12-05: qty 1

## 2023-12-05 MED ORDER — SODIUM CHLORIDE 0.9 % IV SOLN
250.0000 mL | INTRAVENOUS | Status: AC
  Administered 2023-12-05: 250 mL via INTRAVENOUS

## 2023-12-05 MED ORDER — THROMBIN 5000 UNITS EX SOLR
OROMUCOSAL | Status: DC | PRN
  Administered 2023-12-05 (×2): 5 mL via TOPICAL

## 2023-12-05 MED ORDER — MENTHOL 3 MG MT LOZG: 1.0000 | LOZENGE | OROMUCOSAL | Status: DC | PRN

## 2023-12-05 MED ORDER — FENTANYL CITRATE (PF) 250 MCG/5ML IJ SOLN
INTRAMUSCULAR | Status: AC
  Filled 2023-12-05: qty 5

## 2023-12-05 MED ORDER — FENTANYL CITRATE (PF) 250 MCG/5ML IJ SOLN
INTRAMUSCULAR | Status: DC | PRN
  Administered 2023-12-05 (×5): 50 ug via INTRAVENOUS

## 2023-12-05 MED ORDER — LIDOCAINE-EPINEPHRINE 1 %-1:100000 IJ SOLN
INTRAMUSCULAR | Status: DC | PRN
  Administered 2023-12-05: 5 mL

## 2023-12-05 MED ORDER — FLEET ENEMA RE ENEM: 1.0000 | ENEMA | Freq: Once | RECTAL | Status: DC | PRN

## 2023-12-05 MED ORDER — SODIUM CHLORIDE 0.9% FLUSH
3.0000 mL | Freq: Two times a day (BID) | INTRAVENOUS | Status: DC
  Administered 2023-12-05 – 2023-12-07 (×3): 3 mL via INTRAVENOUS

## 2023-12-05 MED ORDER — ONDANSETRON HCL 4 MG/2ML IJ SOLN
INTRAMUSCULAR | Status: DC | PRN
  Administered 2023-12-05: 4 mg via INTRAVENOUS

## 2023-12-05 MED ORDER — STROKE: EARLY STAGES OF RECOVERY BOOK
Freq: Once | Status: AC
  Filled 2023-12-05: qty 1

## 2023-12-05 MED ORDER — DEXAMETHASONE SODIUM PHOSPHATE 10 MG/ML IJ SOLN
INTRAMUSCULAR | Status: DC | PRN
  Administered 2023-12-05 (×2): 5 mg via INTRAVENOUS

## 2023-12-05 MED ORDER — CHLORHEXIDINE GLUCONATE 0.12 % MT SOLN: 15.0000 mL | Freq: Once | OROMUCOSAL | Status: AC

## 2023-12-05 MED ORDER — ACETAMINOPHEN 325 MG PO TABS
650.0000 mg | ORAL_TABLET | ORAL | Status: DC | PRN
  Administered 2023-12-06: 650 mg via ORAL
  Filled 2023-12-05 (×3): qty 2

## 2023-12-05 MED ORDER — CEFAZOLIN SODIUM 1 G IJ SOLR
INTRAMUSCULAR | Status: AC
  Filled 2023-12-05: qty 20

## 2023-12-05 MED ORDER — PHENOL 1.4 % MT LIQD: 1.0000 | OROMUCOSAL | Status: DC | PRN

## 2023-12-05 MED ORDER — THROMBIN 5000 UNITS EX KIT
PACK | CUTANEOUS | Status: AC
  Filled 2023-12-05: qty 1

## 2023-12-05 MED ORDER — METHOCARBAMOL 1000 MG/10ML IJ SOLN: 500.0000 mg | Freq: Four times a day (QID) | INTRAMUSCULAR | Status: DC | PRN

## 2023-12-05 MED ORDER — 0.9 % SODIUM CHLORIDE (POUR BTL) OPTIME
TOPICAL | Status: DC | PRN
  Administered 2023-12-05: 1000 mL

## 2023-12-05 MED ORDER — BACITRACIN ZINC 500 UNIT/GM EX OINT
TOPICAL_OINTMENT | CUTANEOUS | Status: AC
  Filled 2023-12-05: qty 28.35

## 2023-12-05 MED ORDER — PROPOFOL 10 MG/ML IV BOLUS
INTRAVENOUS | Status: DC | PRN
  Administered 2023-12-05: 70 mg via INTRAVENOUS
  Administered 2023-12-05: 30 mg via INTRAVENOUS

## 2023-12-05 MED ORDER — LACTATED RINGERS IV SOLN
INTRAVENOUS | Status: DC
Start: 2023-12-05 — End: 2023-12-05

## 2023-12-05 MED ORDER — SODIUM CHLORIDE 0.9% FLUSH: 3.0000 mL | INTRAVENOUS | Status: DC | PRN

## 2023-12-05 MED ORDER — ORAL CARE MOUTH RINSE: 15.0000 mL | Freq: Once | OROMUCOSAL | Status: AC

## 2023-12-05 MED ORDER — CHLORHEXIDINE GLUCONATE CLOTH 2 % EX PADS
6.0000 | MEDICATED_PAD | Freq: Every day | CUTANEOUS | Status: DC
Start: 2023-12-05 — End: 2023-12-07
  Administered 2023-12-05 – 2023-12-07 (×3): 6 via TOPICAL

## 2023-12-05 MED ORDER — SUGAMMADEX SODIUM 200 MG/2ML IV SOLN
INTRAVENOUS | Status: DC | PRN
  Administered 2023-12-05: 200 mg via INTRAVENOUS

## 2023-12-05 MED ORDER — DEXMEDETOMIDINE HCL IN NACL 80 MCG/20ML IV SOLN
INTRAVENOUS | Status: DC | PRN
  Administered 2023-12-05: 8 ug via INTRAVENOUS

## 2023-12-05 MED ORDER — BUPIVACAINE HCL (PF) 0.5 % IJ SOLN
INTRAMUSCULAR | Status: DC | PRN
  Administered 2023-12-05: 5 mL

## 2023-12-05 SURGICAL SUPPLY — 61 items
BAG COUNTER SPONGE SURGICOUNT (BAG) ×1 IMPLANT
BAND RUBBER #18 3X1/16 STRL (MISCELLANEOUS) ×2 IMPLANT
BENZOIN TINCTURE PRP APPL 2/3 (GAUZE/BANDAGES/DRESSINGS) IMPLANT
BIT DRILL NEURO 2X3.1 SFT TUCH (MISCELLANEOUS) ×1 IMPLANT
BLADE CLIPPER SURG (BLADE) IMPLANT
BLADE SURG 11 STRL SS (BLADE) IMPLANT
BLADE ULTRA TIP 2M (BLADE) IMPLANT
CANISTER SUCTION 3000ML PPV (SUCTIONS) ×1 IMPLANT
CLIP TI MEDIUM 6 (CLIP) IMPLANT
DERMABOND ADVANCED .7 DNX12 (GAUZE/BANDAGES/DRESSINGS) IMPLANT
DEVICE DISSECT PLASMABLAD 3.0S (MISCELLANEOUS) ×1 IMPLANT
DRAPE LAPAROTOMY 100X72 PEDS (DRAPES) ×1 IMPLANT
DRAPE MICROSCOPE SLANT 54X150 (MISCELLANEOUS) IMPLANT
DRSG OPSITE POSTOP 4X6 (GAUZE/BANDAGES/DRESSINGS) IMPLANT
DURAMATRIX ONLAY 2X2 (Neuro Prosthesis/Implant) IMPLANT
DURAPREP 26ML APPLICATOR (WOUND CARE) ×1 IMPLANT
ELECTRODE REM PT RTRN 9FT ADLT (ELECTROSURGICAL) ×1 IMPLANT
FORCEPS BIPOLAR SPETZLER 8 1.0 (NEUROSURGERY SUPPLIES) IMPLANT
GAUZE 4X4 16PLY ~~LOC~~+RFID DBL (SPONGE) IMPLANT
GAUZE SPONGE 4X4 12PLY STRL (GAUZE/BANDAGES/DRESSINGS) IMPLANT
GLOVE BIOGEL PI IND STRL 6.5 (GLOVE) IMPLANT
GLOVE BIOGEL PI IND STRL 8.5 (GLOVE) ×1 IMPLANT
GLOVE ECLIPSE 8.5 STRL (GLOVE) ×1 IMPLANT
GLOVE EXAM NITRILE XL STR (GLOVE) IMPLANT
GLOVE SURG SS PI 6.0 STRL IVOR (GLOVE) IMPLANT
GLOVE SURG SS PI 7.0 STRL IVOR (GLOVE) IMPLANT
GOWN STRL REUS W/ TWL LRG LVL3 (GOWN DISPOSABLE) IMPLANT
GOWN STRL REUS W/ TWL XL LVL3 (GOWN DISPOSABLE) ×1 IMPLANT
GOWN STRL REUS W/TWL 2XL LVL3 (GOWN DISPOSABLE) ×1 IMPLANT
HEMOSTAT POWDER KIT SURGIFOAM (HEMOSTASIS) ×1 IMPLANT
HEMOSTAT SURGICEL 2X14 (HEMOSTASIS) IMPLANT
KIT BASIN OR (CUSTOM PROCEDURE TRAY) ×1 IMPLANT
KIT TURNOVER KIT B (KITS) ×1 IMPLANT
NDL HYPO 22X1.5 SAFETY MO (MISCELLANEOUS) ×1 IMPLANT
NDL SPNL 18GX3.5 QUINCKE PK (NEEDLE) IMPLANT
NEEDLE HYPO 22X1.5 SAFETY MO (MISCELLANEOUS) ×1 IMPLANT
NEEDLE SPNL 18GX3.5 QUINCKE PK (NEEDLE) IMPLANT
NS IRRIG 1000ML POUR BTL (IV SOLUTION) ×1 IMPLANT
PACK LAMINECTOMY NEURO (CUSTOM PROCEDURE TRAY) ×1 IMPLANT
PAD ARMBOARD POSITIONER FOAM (MISCELLANEOUS) ×3 IMPLANT
PATTIES SURGICAL .25X.25 (GAUZE/BANDAGES/DRESSINGS) IMPLANT
PATTIES SURGICAL .5 X1 (DISPOSABLE) IMPLANT
PIN MAYFIELD SKULL DISP (PIN) ×1 IMPLANT
SEALANT ADHERUS EXTEND TIP (MISCELLANEOUS) IMPLANT
SPIKE FLUID TRANSFER (MISCELLANEOUS) ×1 IMPLANT
SPONGE T-LAP 4X18 ~~LOC~~+RFID (SPONGE) IMPLANT
STAPLER SKIN PROX 35W (STAPLE) ×1 IMPLANT
STRIP CLOSURE SKIN 1/2X4 (GAUZE/BANDAGES/DRESSINGS) IMPLANT
SUT 3-0 BLK 1X30 PSL (SUTURE) IMPLANT
SUT NURALON 4 0 TR CR/8 (SUTURE) IMPLANT
SUT PROLENE 6 0 BV (SUTURE) IMPLANT
SUT VIC AB 0 CT1 18XCR BRD8 (SUTURE) ×1 IMPLANT
SUT VIC AB 2-0 CP2 18 (SUTURE) ×1 IMPLANT
SUT VIC AB 3-0 SH 8-18 (SUTURE) IMPLANT
SUT VIC AB 4-0 RB1 18 (SUTURE) ×1 IMPLANT
TOWEL GREEN STERILE (TOWEL DISPOSABLE) ×1 IMPLANT
TOWEL GREEN STERILE FF (TOWEL DISPOSABLE) ×1 IMPLANT
TRAY FOLEY MTR SLVR 14FR STAT (SET/KITS/TRAYS/PACK) IMPLANT
TRAY FOLEY MTR SLVR 16FR STAT (SET/KITS/TRAYS/PACK) IMPLANT
TUBING FEATHERFLOW (TUBING) ×1 IMPLANT
WATER STERILE IRR 1000ML POUR (IV SOLUTION) ×1 IMPLANT

## 2023-12-05 NOTE — Interval H&P Note (Signed)
 History and Physical Interval Note:  12/05/2023 12:46 PM  Gabrielle Donovan  has presented today for surgery, with the diagnosis of Cervical myelopathy occiput - C2.  The various methods of treatment have been discussed with the patient and family. After consideration of risks, benefits and other options for treatment, the patient has consented to  Procedure(s) with comments: POSTERIOR CERVICAL FUSION/FORAMINOTOMY LEVEL 2 (N/A) - Posterior Suboccipital and cervical decompression C1-C2 with resection of pannis and posterior fusion C1-2 as a surgical intervention.  The patient's history has been reviewed, patient examined, no change in status, stable for surgery.  I have reviewed the patient's chart and labs.  Questions were answered to the patient's satisfaction.     [name]

## 2023-12-05 NOTE — Progress Notes (Signed)
 Orthopedic Tech Progress Note Patient Details:  Gabrielle Donovan 01-17-1946 [id number]  Ortho Devices Type of Ortho Device: Soft collar Ortho Device/Splint Location: NECK Ortho Device/Splint Interventions: Ordered, Application   Post Interventions Patient Tolerated: Well Instructions Provided: Care of device  [name] Pac 12/05/2023, 6:24 PM

## 2023-12-05 NOTE — Interval H&P Note (Signed)
 History and Physical Interval Note:  12/05/2023 12:47 PM  Gabrielle Donovan  has presented today for surgery, with the diagnosis of Cervical myelopathy occiput - C2.  The various methods of treatment have been discussed with the patient and family. After consideration of risks, benefits and other options for treatment, the patient has consented to  Procedure(s) with comments: POSTERIOR CERVICAL FUSION/FORAMINOTOMY LEVEL 2 (N/A) - Posterior Suboccipital and cervical decompression C1-C2 with resection of pannis and posterior fusion C1-2 as a surgical intervention.  The patient's history has been reviewed, patient examined, no change in status, stable for surgery.  I have reviewed the patient's chart and labs.  Questions were answered to the patient's satisfaction.     [name]

## 2023-12-05 NOTE — Progress Notes (Signed)
 Patient in recovery at 1619. Patient began waking up around 1630--left side flaccid, dysarthria, with unequal pupils L>R. Notified Dr. Darlyn and Dr. Colon of findings. Both physicians to bedside to assess. Patient still unable to move left side with exception of a flicker of LLE to painful stimuli. Plan made to call for a code stroke. CT notified and patient transported for immediate imaging. Met with stroke team in CT. Imaging done and patient transported back to PACU with plans to transfer to neuro ICU. Vital signs remained stable and patient transported to neuro ICU. Family updated via phone.

## 2023-12-05 NOTE — Plan of Care (Signed)

## 2023-12-05 NOTE — Progress Notes (Signed)
  Progress Note   Patient: Gabrielle Donovan FMW:[id number] DOB: 02/14/1946 DOA: 11/28/2023     7 DOS: the patient was seen and examined on 12/05/2023        Brief hospital course: 78 y.o. F with polycythemia, HTN, degenerative spine disease presented with progressive left sided weakness.  Imaging revealed C1 cervical spinal stenosis.  NSGY consulted.     Assessment and Plan: * Cervical myelopathy (HCC) To the OR today.  Post-op left sided facial droop, flaccidity, dysarthria High risk surgery.  Unclear if this is stroke or amplification of pre-existing deficits due to anesthesia in an 78 yo. - MRI brain ordered - Non-invasive angiography showed no LVO but severe stenosis left vert, moderate left M2, nothing on the right - Defer echo to Neurology - Lipids ordered  -- Transferred to ICU, hospitalist service will sign off.  Please reengage as appropriate      Hypothyroidism TSH normal this admission - Continue levothyroxine    Essential hypertension Has been on home Coreg  and chlorthalidone   Polycythemia vera (HCC) Now off momelotinib.  Counts have been relatively stable. - Continue hydroxyurea           Subjective: Felt well this morning prior to surgery.  Went to OR for decompression, post-op with flaccidity on rleft side, now resolving.     Physical Exam: BP (!) 149/76   Pulse 84   Temp 98.6 F (37 C)   Resp 19   Ht 5' 6 (1.676 m)   Wt 57.2 kg   SpO2 96%   BMI 20.35 kg/m   General appearance: elderly female, some loss of subcutaneous muscle mass and fat, alert and responsive prior to surgery, afterwards appears sluggish/sedated from anesthesia and pain  HEENT:  Anicteric, conjunctivae and sclerae normal without injection or icterus, edentulous, OP moist, no oral lesions, lips normal  Cor:  RRR, without murmurs, rubs.  No JVD, mild RLE pitting, similar to previous Resp:  Normal respiratory rate and rhythm.  Lungs diminished but no rales Abd:  No grimace  to palpation.    Neuro: Speech seems fluent.  Face symmetric.  Stable right eyelid droop.  EOMI normal for me.  Oriented to hospital.  Orientation questions limited by pain, overall exam limited by pain.  Generalized weakness of arms, more on the left.  Does not move legs due to pain.    Psych: Eyes closed, attention diminished due to pain.          Data Reviewed: Discussed with CCM     Family Communication: Daughter at bedside    Disposition: Status is: Inpatient         Author: Lonni SHAUNNA Dalton, MD 12/05/2023 6:21 PM  For on call review [email or web address].

## 2023-12-05 NOTE — Consult Note (Signed)
 NEUROLOGY CONSULT NOTE   Date of service: December 05, 2023 Patient Name: Gabrielle Donovan MRN:  [id number] DOB:  December 26, 1945 Chief Complaint: inpatient code stroke Requesting Provider: Jonel Lonni SQUIBB, *  History of Present Illness  Gabrielle Donovan is a 78 y.o. female with hx of HTN, polycythemia, degenerative spine disease who presented 9/15 with progressive left-sided weakness.  Imaging revealed C1 cervical spinal stenosis.  Diagnosed with cervical myelopathy occiput at C2, status post occiput decompression to C2 with resection of synovial cyst with transdural approach.  Inpatient code stroke called from PACU today around 10 minutes after her admission there postop.  Code stroke called for left-sided paralysis.  On exam in CT, patient is lethargic but awakens to voice, oriented, full visual fields, no aphasia, bilateral leg weakness, increased in left with only ability to wiggle toes on left leg.   LKW:  Modified rankin score: 2-Slight disability-UNABLE to perform all activities but does not need assistance IV Thrombolysis: No, post-op EVT: No, no LVO suspected  NIHSS components Score: Comment  1a Level of Conscious 0[]  1[x]  2[]  3[]      1b LOC Questions 0[x]  1[]  2[]       1c LOC Commands 0[x]  1[]  2[]       2 Best Gaze 0[x]  1[]  2[]       3 Visual 0[x]  1[]  2[]  3[]      4 Facial Palsy 0[x]  1[]  2[]  3[]      5a Motor Arm - left 0[]  1[]  2[x]  3[]  4[]  UN[]    5b Motor Arm - Right 0[]  1[]  2[]  3[]  4[]  UN[]    6a Motor Leg - Left 0[]  1[]  2[]  3[x]  4[]  UN[]    6b Motor Leg - Right 0[x]  1[]  2[x]  3[]  4[]  UN[]    7 Limb Ataxia 0[x]  1[]  2[]  UN[]      8 Sensory 0[x]  1[]  2[]  UN[]      9 Best Language 0[x]  1[]  2[]  3[]      10 Dysarthria 0[]  1[x]  2[]  UN[]      11 Extinct. and Inattention 0[x]  1[]  2[]       TOTAL:   9      ROS  Comprehensive ROS performed and pertinent positives documented in HPI   Past History   Past Medical History:  Diagnosis Date   Anemia    Arthritis    Cervical myelopathy  (HCC)    Diverticulitis    Full dentures    GI bleed    Glaucoma    right eye   Headache    History of blood transfusion    Hypertension    Lumbago with sciatica, right side    treated with ESI   Polycythemia    Thrombocytosis    Wears glasses     Past Surgical History:  Procedure Laterality Date   ABDOMINAL HYSTERECTOMY     CATARACT EXTRACTION, BILATERAL     FRACTURE SURGERY     right wrist   POSTERIOR CERVICAL FUSION/FORAMINOTOMY N/A 01/18/2019   Procedure: Posterior decompression ring of Cervical one;  Surgeon: Colon Shove, MD;  Location: [facility] OR;  Service: Neurosurgery;  Laterality: N/A;   POSTERIOR CERVICAL LAMINECTOMY N/A 01/18/2019   Procedure: POSTERIOR CERVICAL WOUND EXPLORATION;  Surgeon: Colon Shove, MD;  Location: [facility] OR;  Service: Neurosurgery;  Laterality: N/A;   TOTAL HIP ARTHROPLASTY Right 07/31/2013   Procedure: RIGHT TOTAL HIP ARTHROPLASTY ANTERIOR APPROACH;  Surgeon: Donnice JONETTA Car, MD;  Location: [facility];  Service: Orthopedics;  Laterality: Right;   TUBAL LIGATION      Family History: Family History  Problem Relation Age of Onset   Hypertension Mother     Social History  reports that she has never smoked. She has never used smokeless tobacco. She reports that she does not drink alcohol  and does not use drugs.  Allergies  Allergen Reactions   Codeine Nausea And Vomiting   Hydrocodone  Nausea And Vomiting    Medications   Current Facility-Administered Medications:    [START ON 12/06/2023]  stroke: early stages of recovery book, , Does not apply, Once, Lehner, Erin C, NP   [MAR Hold] acetaminophen  (TYLENOL ) tablet 650 mg, 650 mg, Oral, Q6H PRN, 650 mg at 12/01/23 2114 **OR** [MAR Hold] acetaminophen  (TYLENOL ) suppository 650 mg, 650 mg, Rectal, Q6H PRN, Mansy, Madison LABOR, MD   [MAR Hold] brimonidine  (ALPHAGAN ) 0.2 % ophthalmic solution 1 drop, 1 drop, Right Eye, QHS, Mansy, Jan A, MD, 1 drop at 12/04/23 2122   [facility] Hold] carvedilol  (COREG ) tablet 3.125 mg,  3.125 mg, Oral, BID, Drusilla, Gagan S, MD, 3.125 mg at 12/05/23 1105   [MAR Hold] chlorthalidone  (HYGROTON ) tablet 25 mg, 25 mg, Oral, Daily, Danford, Lonni SQUIBB, MD, 25 mg at 12/04/23 0950   [MAR Hold] folic acid  (FOLVITE ) tablet 1 mg, 1 mg, Oral, Daily, Mansy, Jan A, MD, 1 mg at 12/04/23 0950   [MAR Hold] hydroxyurea  (HYDREA ) capsule 500 mg, 500 mg, Oral, QID, Drusilla, Gagan S, MD, 500 mg at 12/04/23 2123   lactated ringers  infusion, , Intravenous, Continuous, Darlyn Rush, MD   [facility] Hold] levothyroxine  (SYNTHROID ) tablet 50 mcg, 50 mcg, Oral, QODAY, Mansy, Jan A, MD, 50 mcg at 12/03/23 0933   [facility] Hold] levothyroxine  (SYNTHROID ) tablet 75 mcg, 75 mcg, Oral, QODAY, Mansy, Jan A, MD, 75 mcg at 12/04/23 0528   [facility] Hold] loratadine  (CLARITIN ) tablet 10 mg, 10 mg, Oral, Daily, Mansy, Jan A, MD, 10 mg at 12/04/23 0950   [MAR Hold] magnesium  hydroxide (MILK OF MAGNESIA) suspension 30 mL, 30 mL, Oral, Daily PRN, Mansy, Madison LABOR, MD   [facility] Hold] ondansetron  (ZOFRAN ) tablet 4 mg, 4 mg, Oral, Q6H PRN **OR** [MAR Hold] ondansetron  (ZOFRAN ) injection 4 mg, 4 mg, Intravenous, Q6H PRN, Mansy, Madison LABOR, MD   [MAR Hold] oxyCODONE  (Oxy IR/ROXICODONE ) immediate release tablet 5 mg, 5 mg, Oral, Q6H PRN, Mansy, Jan A, MD, 5 mg at 12/04/23 2122   [facility] Hold] traZODone  (DESYREL ) tablet 25 mg, 25 mg, Oral, QHS PRN, Mansy, Jan A, MD, 25 mg at 12/04/23 2122  Vitals   Vitals:   12/05/23 1033 12/05/23 1619 12/05/23 1630 12/05/23 1715  BP: 125/60 (!) 149/73 (!) 161/93 (!) 135/59  Pulse: 77 78 78 73  Resp: 18 (!) 25 (!) 30 17  Temp: 98.5 F (36.9 C) 98.6 F (37 C)    TempSrc: Oral     SpO2: 97% 95%  94%  Weight: 57.2 kg     Height: 5' 6 (1.676 m)       Body mass index is 20.35 kg/m.   Physical Exam   Constitutional: Appears elderly, well-developed and well-nourished.  Cardiovascular: Normal rate and regular rhythm.  Respiratory: Effort normal, non-labored breathing.   Neurologic Examination   Neuro: Mental  Status: Patient is drowsy, oriented to person, place, month Follows commands and answers questions Cranial Nerves: II: Visual Fields are full. Pupils are equal, round, and reactive to light.   III,IV, VI: She has a disconjugate gaze with her right eye slightly down into the right compared to the left V: Facial sensation is symmetric to light touch VII:  Facial movement is symmetric.  VIII: hearing is intact to voice X: Uvula elevates symmetrically. Mild dysarthria.  XI: Shoulder shrug is symmetric. XII: tongue is midline without atrophy or fasciculations.  Motor: Tone is normal. Bulk is normal. Bilateral leg weakness, worse in left  Able to maintain left arm against gravity.  RUE: 4/5 RLE: 4/5 LLE: 2/5. Only able to wiggle toes.  Sensory: Sensation is symmetric to light touch in the arms and legs. Cerebellar: No overt ataxia   Labs/Imaging/Neurodiagnostic studies   CBC:  Recent Labs  Lab Dec 02, 2023 0650 12/03/23 0312  WBC 9.2 12.8*  HGB 11.0* 10.7*  HCT 32.6* 31.1*  MCV 118.1* 116.0*  PLT 429* 545*   Basic Metabolic Panel:  Lab Results  Component Value Date   NA 135 12/03/2023   K 3.8 12/03/2023   CO2 22 12/03/2023   GLUCOSE 94 12/03/2023   BUN 16 12/03/2023   CREATININE 0.77 12/03/2023   CALCIUM  8.9 12/03/2023   GFRNONAA >60 12/03/2023   GFRAA 69 (L) 08/02/2013    INR  Lab Results  Component Value Date   INR 1.12 07/27/2013   APTT  Lab Results  Component Value Date   APTT 38 (H) 07/27/2013   CT Head without contrast(Personally reviewed): No acute intracranial hemorrhage or evidence of acute infarct Extensive pneumocephalus, greatest along anterior frontal lobes and within posterior fossa likely related to cervical spine surgery performed earlier today. Parenchyma atrophy and chronic small vessel ischemic disease  CT angio Head and Neck with contrast(Personally reviewed): No proximal intracranial LVO identified Estimated 50% stenosis at right ICA  origin Estimated 50 to 60% stenosis left ICA origin  MRI Brain(Personally reviewed): ordered   ASSESSMENT   78 y.o. female with hx of HTN, polycythemia, degenerative spine disease who presented 9/15 with progressive left-sided weakness.  Imaging revealed C1 cervical spinal stenosis.  Diagnosed with cervical myelopathy occiput at C2, status post occiput decompression to C2 with resection of synovial cyst with transdural approach.  Inpatient code stroke called from PACU. On exam, patient is lethargic but awakens to voice, oriented, full visual fields, no aphasia, bilateral leg(L>>R) weakness, increased in left with only ability to wiggle toes on left leg.  Impression: Post-op anesthesia effects versus stroke  RECOMMENDATIONS   - MRI Brain to evaluate for stroke - STAT CTH with any acute neuro change - Frequent Neuro checks per stroke unit protocol - Swallow screen - will be performed prior to PO intake  If positive for stroke,  - TTE - Lipid panel - Statin - will be started if LDL>70 or otherwise medically indicated - A1C - Antithrombotic will be added - Stroke education - will be given - PT/OT/SLP  ______________________________________________________________________  Signed, Rocky JAYSON Likes, NP [facility]   I have seen and evaluated the patient. I have reviewed the above note.  My suspicion is that this represents anesthesia effect with some underlying deficits coupled with left-sided weakness as result of her cervical compression.  Given that there was some concern for garbled speech, I think an MRI is not unreasonable but I would not do further testing unless this were to be positive.  Aisha Seals, MD [facility]   If 7pm- 7am, please page neurology on call as listed in AMION.

## 2023-12-05 NOTE — Progress Notes (Addendum)
 Occupational Therapy Treatment Patient Details Name: Gabrielle Donovan MRN: [id number] DOB: 04-Feb-1946 Today's Date: 12/05/2023   History of present illness Gabrielle Donovan is an 78 yo female who presented 11/28/23 with progressive L sided weakness. Imaging revealed C1 cervical spinal stenosis, NSGY  consulted and suggested near-future surgery. PMHx:  OA, essential HTN, right sided low back pain, and polycythemia.   OT comments  Pt progressing slowly toward established OT goals. Surgery scheduled for today per RN. Focus session on safety with transfers and beginning education for cervical precautions. Pt needing mod cues for recall of compensatory techniques for LB ADL and oral care at end of session to maintain cervical precautions. Practiced serial STS with cues for hand placement on all attempts; after 3x reps, pt able to recall hand placement min cues. Will continue to follow; re-ev likely needed after surgery.       If plan is discharge home, recommend the following:  A little help with walking and/or transfers;A little help with bathing/dressing/bathroom;Two people to help with bathing/dressing/bathroom;Assist for transportation;Help with stairs or ramp for entrance;Assistance with cooking/housework   Equipment Recommendations  None recommended by OT    Recommendations for Other Services      Precautions / Restrictions Precautions Precautions: Fall Recall of Precautions/Restrictions: Impaired Precaution/Restrictions Comments: poor carryover of safety techniques for transfer training. began education for neck precautions; better recall when offered options. Restrictions Weight Bearing Restrictions Per Provider Order: No       Mobility Bed Mobility               General bed mobility comments: EOB on arrival and departure with bed alarm on    Transfers Overall transfer level: Needs assistance Equipment used: Rolling walker (2 wheels) Transfers: Sit to/from Stand Sit to Stand:  Min assist           General transfer comment: Min A for rise; dense cues for hand placement despite repeated practice.     Balance Overall balance assessment: Needs assistance Sitting-balance support: Feet supported, Bilateral upper extremity supported Sitting balance-Leahy Scale: Fair     Standing balance support: Bilateral upper extremity supported, During functional activity, Reliant on assistive device for balance Standing balance-Leahy Scale: Poor Standing balance comment: reliant on RW                           ADL either performed or assessed with clinical judgement   ADL Overall ADL's : Needs assistance/impaired     Grooming: Set up;Sitting                   Toilet Transfer: Minimal assistance;Ambulation;Rolling walker (2 wheels);Cueing for safety;Cueing for sequencing           Functional mobility during ADLs: Minimal assistance;Rolling walker (2 wheels)      Extremity/Trunk Assessment              Vision       Perception     Praxis     Communication Communication Communication: No apparent difficulties   Cognition Arousal: Alert Behavior During Therapy: WFL for tasks assessed/performed Cognition: No family/caregiver present to determine baseline, Cognition impaired     Awareness: Online awareness impaired Memory impairment (select all impairments): Short-term memory, Working [employer] fast streamer, Armed forces training and education officer functioning impairment (select all impairments): Sequencing, Organization, Problem solving OT - Cognition Comments: limited insight into deficits. Pt needing cues for safety during transfers. Poor recall of new education  Following commands: Impaired Following commands impaired: Follows one step commands with increased time, Follows multi-step commands inconsistently      Cueing   Cueing Techniques: Verbal cues  Exercises Exercises: Other exercises Other Exercises Other  Exercises: repetitive practice STS    Shoulder Instructions       General Comments VSS on RA    Pertinent Vitals/ Pain       Pain Assessment Pain Assessment: No/denies pain  Home Living                                          Prior Functioning/Environment              Frequency  Min 2X/week        Progress Toward Goals  OT Goals(current goals can now be found in the care plan section)  Progress towards OT goals: Progressing toward goals  Acute Rehab OT Goals Patient Stated Goal: home OT Goal Formulation: With patient Time For Goal Achievement: 12/15/23 Potential to Achieve Goals: Good ADL Goals Pt Will Perform Lower Body Bathing: with modified independence;sit to/from stand Pt Will Perform Lower Body Dressing: with modified independence;sit to/from stand Pt Will Transfer to Toilet: with modified independence;ambulating;regular height toilet Additional ADL Goal #1: Pt will navigate hospital environmnet with mod I and LRAD independently to demonstrate decreased fall risk  Plan      Co-evaluation                 AM-PAC OT 6 Clicks Daily Activity     Outcome Measure   Help from another person eating meals?: None Help from another person taking care of personal grooming?: A Little Help from another person toileting, which includes using toliet, bedpan, or urinal?: A Little Help from another person bathing (including washing, rinsing, drying)?: A Little Help from another person to put on and taking off regular upper body clothing?: A Little Help from another person to put on and taking off regular lower body clothing?: A Little 6 Click Score: 19    End of Session Equipment Utilized During Treatment: Gait belt;Rolling walker (2 wheels)  OT Visit Diagnosis: Unsteadiness on feet (R26.81);Other abnormalities of gait and mobility (R26.89);Muscle weakness (generalized) (M62.81);History of falling (Z91.81)   Activity Tolerance Patient  tolerated treatment well   Patient Left in chair;with call bell/phone within reach;with chair alarm set   Nurse Communication Mobility status        Time: 9192-9172 OT Time Calculation (min): 20 min  Charges: OT General Charges $OT Visit: 1 Visit OT Treatments $Self Care/Home Management : 8-22 mins $Therapeutic Activity: 8-22 mins  Elma JONETTA Lebron FREDERICK, OTR/L [facility] Acute Rehabilitation Office: [phone]   Elma JONETTA Lebron 12/05/2023, 9:33 AM

## 2023-12-05 NOTE — Progress Notes (Signed)
 Patient refusing MRI at this time. Patient educated on importance of MRI imaging to rule out stroke. Pt states I did not have a stroke. I do not want an MRI tonight, maybe tomorrow. [name] notified. Will continue to encourage her to agree to MRI throughout the night.

## 2023-12-05 NOTE — Code Documentation (Addendum)
 Gabrielle Donovan is an 78 yr old female with a PMH of  HTN, polycythemia, and degenerative spine disease. She is currently in PACU S/P Posterior cervical fusion and foraminotomy. She was last known well today before her surgery at 11:00, and is now complaining of Left sided weakness.  CBG, BP WNL per RN. Taken to CT by PACU RN. Met by team in CT. NIHSS 9. Pt is drowsy, weak in legs, left more so than right, and dysarthric. The following imaging was obtained: CT, CTA. Per Dr. Michaela, CT is negative for hemorrhage, and CTA is negative for LVO.  Pt not a candidate for TNK as post op, and outside of the treatment window. She is not eligible for thrombectomy as she is LVO negative. Handoff with Bernardino RN complete.   No acute treatment. Q 2h x 12 hours NIHSS & VS, then q4h  NPO until Stroke swallow screen obtained.  Richardson Said RN Stroke Response

## 2023-12-05 NOTE — Consult Note (Signed)
 NAME:  Gabrielle Donovan, MRN:  [id number], DOB:  Feb 15, 1946, LOS: 7 ADMISSION DATE:  11/28/2023, CONSULTATION DATE:  12/05/23 REFERRING MD:  Colon , CHIEF COMPLAINT:  s/p  C2 decompression, synovial cyst resection   History of Present Illness:  78 yo F PMH OA, HTN, prior cervical laminectomy who presented to ED 11/28/23 w CC extremity weakness found to have  spinal cord compression at craniocervical junction 2/2 pannus at level of C2 vertebra -- for which NSGY was consulted. Intended for discharge 9/19 but did not ultimately have safe home dispo so remained inpatient until surgery. Pt went for posterior cervical fusion/foraminotomy level of C2 12/05/23 with Dr. Colon. Case unremarkable    Post operatively she had L sided weakness for which a code stroke was called. CT H neg for acute hemorrhage, CTA neg for LVO. Not a candidate for tnk given spine surgery same day.   She was transferred to ICU in this setting and PCCM consulted   Pertinent  Medical History  HTN DDD Cervical myelopathy Polycythemia OA   Significant Hospital Events: Including procedures, antibiotic start and stop dates in addition to other pertinent events   9/15 admit for extremity wknss, C1-C2 bulky pannus causing cord compression 9/16 NSGY consult 9/22 OR for posterior suboccipital cervical decompression C1-C2, with pannus decompression. Code stroke post op for LUE weakness   Interim History / Subjective:  POD0   Code stroke for LUE weakness. CT H no hemorrhage. CTA no LVO   Objective    Blood pressure (!) 149/76, pulse 84, temperature 98.6 F (37 C), resp. rate 19, height 5' 6 (1.676 m), weight 57.2 kg, SpO2 96%.        Intake/Output Summary (Last 24 hours) at 12/05/2023 1810 Last data filed at 12/05/2023 1604 Gross per 24 hour  Intake 1010 ml  Output 400 ml  Net 610 ml   Filed Weights   11/28/23 1609 12/05/23 1033  Weight: 57.2 kg 57.2 kg    Examination: General: Chronically ill-appearing female,  lying on the bed HEENT: [place]/AT, eyes anicteric.  moist mucus membranes.  Soft neck collar in place Neuro: Awake, opens eyes with vocal stimuli, following simple commands, antigravity on all 4 extremities but left side is weaker compared to right side Chest: Coarse breath sounds, no wheezes or rhonchi Heart: Regular rate and rhythm, no murmurs or gallops Abdomen: Soft, nontender, nondistended, bowel sounds present  New labs are pending Images reviewed  Patient Lines/Drains/Airways Status     Active Line/Drains/Airways     Name Placement date Placement time Site Days   Peripheral IV 12/03/23 22 G 1.75 Anterior;Left Forearm 12/03/23  0647  Forearm  2   Peripheral IV 12/05/23 18 G Anterior;Right Forearm 12/05/23  1200  Forearm  less than 1   Urethral Catheter Gabrielle ratcliff rn Latex;Straight-tip 14 Fr. 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  Code stroke was called Stat head CT was done which showed no acute abnormalities except extensive pneumocephalus CTA is negative for LVO Per her family her left-sided weakness is at baseline now MRI brain is pending to rule out acute stroke Not a candidate  for TNK or thrombectomy considering no LVO Continue secondary stroke prophylaxis  HTN Hold home meds including coreg , chlorthalidone   Hypothyroidism  Resume levothyroxine   Polycythemia vera Continue hydroxyurea     Labs   CBC: Recent Labs  Lab 11/29/23 0650 12/03/23 0312  WBC 9.2 12.8*  HGB 11.0* 10.7*  HCT 32.6* 31.1*  MCV 118.1* 116.0*  PLT 429* 545*    Basic Metabolic Panel: Recent Labs  Lab 11/29/23 0650 12/03/23 0312  NA 138 135  K 3.4* 3.8  CL 108 103  CO2 22 22  GLUCOSE 82 94  BUN 10 16  CREATININE 0.62 0.77  CALCIUM  8.7* 8.9   GFR: Estimated Creatinine Clearance: 50.6 mL/min (by C-G formula based on SCr of 0.77 mg/dL). Recent Labs  Lab 11/29/23 0650 12/03/23 0312  WBC 9.2 12.8*    Liver Function Tests: No results for input(s): AST, ALT, ALKPHOS, BILITOT, PROT, ALBUMIN in the last 168 hours. No results for input(s): LIPASE, AMYLASE in the last 168 hours. No results for input(s): AMMONIA in the last 168 hours.  ABG No results found for: PHART, PCO2ART, PO2ART, HCO3, TCO2, ACIDBASEDEF, O2SAT   Coagulation Profile: No results for input(s): INR, PROTIME in the last 168 hours.  Cardiac Enzymes: No results for input(s): CKTOTAL, CKMB, CKMBINDEX, TROPONINI in the last 168 hours.  HbA1C: No results found for: HGBA1C  CBG: Recent Labs  Lab 12/05/23 1643  GLUCAP 147*    Review of Systems:   Unable to obtain as patient received pain medication and now she is drowsy  Past Medical History:  She,  has a past medical history of Anemia, Arthritis, Cervical myelopathy (HCC), Diverticulitis, Full dentures, GI bleed, Glaucoma, Headache, History of blood transfusion, Hypertension, Lumbago with sciatica, right side, Polycythemia, Thrombocytosis, and Wears glasses.   Surgical History:   Past Surgical History:  Procedure Laterality Date   ABDOMINAL HYSTERECTOMY     CATARACT EXTRACTION, BILATERAL      FRACTURE SURGERY     right wrist   POSTERIOR CERVICAL FUSION/FORAMINOTOMY N/A 01/18/2019   Procedure: Posterior decompression ring of Cervical one;  Surgeon: Colon Shove, MD;  Location: [facility];  Service: Neurosurgery;  Laterality: N/A;   POSTERIOR CERVICAL LAMINECTOMY N/A 01/18/2019   Procedure: POSTERIOR CERVICAL WOUND EXPLORATION;  Surgeon: Colon Shove, MD;  Location: [facility] OR;  Service: Neurosurgery;  Laterality: N/A;   TOTAL HIP ARTHROPLASTY Right 07/31/2013   Procedure: RIGHT TOTAL HIP ARTHROPLASTY ANTERIOR APPROACH;  Surgeon: Donnice JONETTA Car, MD;  Location: [facility] ORS;  Service: Orthopedics;  Laterality: Right;   TUBAL LIGATION       Social History:   reports that she has never smoked. She has never used smokeless tobacco. She reports that she does not drink alcohol  and does not use drugs.   Family History:  Her family history includes Hypertension in her mother.   Allergies Allergies  Allergen Reactions   Codeine Nausea And Vomiting   Hydrocodone  Nausea And Vomiting     Home Medications  Prior to Admission medications   Medication Sig Start Date End Date Taking? Authorizing Provider  aspirin  EC 81 MG tablet Take 81 mg by mouth daily. Swallow whole.   Yes [provider]  brimonidine  (ALPHAGAN ) 0.2 % ophthalmic solution Place 1 drop into the right eye at bedtime.   Yes [provider]  cetirizine (ZYRTEC) 10 MG tablet Take 10 mg by mouth daily. 09/20/22  Yes [provider]  folic acid  (FOLVITE ) 1 MG tablet Take 1 mg by mouth daily.   Yes [provider]  hydroxyurea  (HYDREA ) 500 MG capsule Take 500 mg by mouth in the morning, at noon, in the evening, and at bedtime.   Yes [provider]  SYNTHROID  50 MCG tablet Take 50 mcg by mouth every other day. Alternating with 75mcg 08/31/21  Yes [provider]  SYNTHROID  75 MCG tablet Take 75 mcg by mouth every other day. Alternating with 5mcg   Yes [provider]  carvedilol   (COREG ) 3.125 MG tablet Take 1 tablet (3.125 mg total) by mouth 2 (two) times daily with a meal. 12/02/23   Danford, Lonni SQUIBB, MD  chlorthalidone  (HYGROTON ) 25 MG tablet Take 1 tablet (25 mg total) by mouth daily as needed (for high BP). 12/02/23   Danford, Lonni SQUIBB, MD  latanoprost  (XALATAN ) 0.005 % ophthalmic solution Place 1 drop into the right eye at bedtime. Patient not taking: Reported on 11/29/2023 10/25/18   [provider]  oxyCODONE  (OXY IR/ROXICODONE ) 5 MG immediate release tablet Take 1 tablet (5 mg total) by mouth every 6 (six) hours as needed for severe pain (pain score 7-10). 12/02/23   Danford, Lonni SQUIBB, MD       Valinda Novas, MD [facility] See Amion for pager If no response to pager, please call [phone] until 7pm After 7pm, Please call E-link [phone]

## 2023-12-05 NOTE — Transfer of Care (Signed)
 Immediate Anesthesia Transfer of Care Note  Patient: Gabrielle Donovan  Procedure(s) Performed: POSTERIOR SUBOCCIPITAL AND CERVICAL DECOMPRESSION CERVICAL ONE-CERVICAL TWO WITH RESECTION OF PANNUS (Neck)  Patient Location: PACU  Anesthesia Type:General  Level of Consciousness: awake, alert , and oriented  Airway & Oxygen Therapy: Patient Spontanous Breathing  Post-op Assessment: Report given to RN and Post -op Vital signs reviewed and stable  Post vital signs: Reviewed and stable  Last Vitals:  Vitals Value Taken Time  BP 149/73 12/05/23 16:19  Temp    Pulse 76 12/05/23 16:21  Resp 24 12/05/23 16:21  SpO2 95 % 12/05/23 16:21  Vitals shown include unfiled device data.  Last Pain:  Vitals:   12/05/23 1050  TempSrc:   PainSc: 7       Patients Stated Pain Goal: 0 (12/01/23 1837)  Complications: No notable events documented.

## 2023-12-05 NOTE — Progress Notes (Signed)
 PT Cancellation Note  Patient Details Name: DOSHIA DALIA MRN: [id number] DOB: [birth year]   Cancelled Treatment:    Reason Eval/Treat Not Completed: Patient at procedure or test/unavailable. Pt currently off unit for surgery. Will check back as schedule allows to continue with PT POC.    Leita JONETTA Sable 12/05/2023, 8:52 AM  Leita Sable, PT, DPT Acute Rehabilitation Services Secure Chat Preferred Office: [phone]

## 2023-12-05 NOTE — Op Note (Signed)
 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 Surgeon: Victory Gens First Assistant: Duwaine Beck Anesthesia: General Endotracheal Indications: Gabrielle Donovan is an 78 year old individual who has previously developed a significant pannus at the level of C2.  She is decompressed about 6 years ago with a steroid decompression removing the ring of C1.  She tolerated this quite well but in the last 4 months she has had progressive weakness of her left upper and left lower extremity such that she is having increasing frequency of falls and has difficulty using the left upper extremity for cavities of daily living.  She has been advised regarding posterior decompression with attempt to drain the synovial cyst.  Procedure: The patient was brought to the operating room supine on the stretcher.  After the smooth induction of general tracheal anesthesia she is placed in the 3 pin headrest and carefully turned to the prone position.  The back of the neck was prepped with alcohol  DuraPrep and draped in a sterile fashion.  Previously made incision was reopened and the dissection was carried down to the cervical dorsal fascia.  Fascia was opened on either side of midline to expose down to C2 spinous process and laminar arch dissection was carried out laterally to the suboccipital region.  Thick redundant fascia had formed in the area where the ring of C1 had been.  This was carefully dissected down to the dura.  On the left side the dissection was carried far laterally.  Ultimately with good dural exposure the occiput was opened with a partial resection of the foramen magnum on the posterior and left side.  Hemostasis was obtained meticulously and then  the dura was opened in a linear fashion on the left side.  Tack up sutures were placed around the perimetry and several hemoclips were used to secure the arachnoid to the underlying dura.  The operating microscope was brought into view and by carefully dissecting through the arachnoid it were able to identify the nerve roots at C1 entering into the foramen.  Inferiorly there was some lits traveling cephalad ventrally which were likely some cranial nerve elements.  The dissection occurred carefully and on the ventral aspect of the cord 1 could identify easily a rather prominent mass.  The cord was being shifted to the right.  By isolating an area of this the dura was cauterized ventrally and then a linear incision was created in it.  Immediately under some modest pressure some thickened grumous fluid was encountered.  Further dissection yielded chunks of tissue with a Morphis structure.  These were resected.  A small nerve hook was used to dissect within the mass and removed several fragments of it.  This allowed for good decompression of the ventral aspect of the spinal cord.  This procedure continued until the ventral area was well decompressed.  When this was accomplished and no further material could be elicited hemostasis was carefully obtained.  Some deep bleeding veins on the ventral aspect of the dura were cauterized and then up Surgifoam was placed over this to obtain tamponade.  When adequate tamponade was obtained dural closure on the dorsal aspect of the dura was performed with 6-0 Prolene and a piece of collagen matrix dural substitute.  This was sewn into place.  A  fairly dry condition existed however we used some adherence over the dorsal surface to contained this even further.  The microscope was then removed and retractors were removed the cervical dorsal fascia was closed and reapproximated with #1 Vicryl in interrupted fashion 2-0 Vicryl was used in the subcutaneous tissues 3-0 Vicryl and 4-0  Vicryl subcuticularly.  Dermabond was placed on the skin blood loss was estimated at less than 100 cc.  Patient tolerated procedure well was removed from the 3 pin headrest and turned back supine onto the patient's own bed.

## 2023-12-05 NOTE — Anesthesia Procedure Notes (Signed)
 Procedure Name: Intubation Date/Time: 12/05/2023 12:59 PM  Performed by: Lockie Flesher, CRNAPre-anesthesia Checklist: Patient identified, Emergency Drugs available, Suction available and Patient being monitored Patient Re-evaluated:Patient Re-evaluated prior to induction Oxygen Delivery Method: Circle System Utilized Preoxygenation: Pre-oxygenation with 100% oxygen Induction Type: IV induction Ventilation: Mask ventilation without difficulty Laryngoscope Size: Glidescope Grade View: Grade I Tube type: Oral Tube size: 7.0 mm Number of attempts: 1 Airway Equipment and Method: Stylet and Oral airway Placement Confirmation: ETT inserted through vocal cords under direct vision, positive ETCO2 and breath sounds checked- equal and bilateral Secured at: 22 cm Tube secured with: Tape Dental Injury: Teeth and Oropharynx as per pre-operative assessment

## 2023-12-06 ENCOUNTER — Other Ambulatory Visit (HOSPITAL_COMMUNITY)

## 2023-12-06 ENCOUNTER — Encounter (HOSPITAL_COMMUNITY): Payer: Self-pay | Admitting: Neurological Surgery

## 2023-12-06 DIAGNOSIS — R531 Weakness: Secondary | ICD-10-CM | POA: Diagnosis not present

## 2023-12-06 LAB — LIPID PANEL
Cholesterol: 173 mg/dL (ref 0–200)
HDL: 39 mg/dL — ABNORMAL LOW (ref >40)
LDL Cholesterol: 122 mg/dL — ABNORMAL HIGH (ref 0–99)
Total CHOL/HDL Ratio: 4.4 ratio
Triglycerides: 62 mg/dL (ref <150)
VLDL: 12 mg/dL (ref 0–40)

## 2023-12-06 MED FILL — Thrombin For Soln 5000 Unit: CUTANEOUS | Qty: 5000 | Status: AC

## 2023-12-06 NOTE — Progress Notes (Signed)
 NEUROLOGY CONSULT FOLLOW UP NOTE   Date of service: December 06, 2023 Patient Name: Gabrielle Donovan MRN:  [id number] DOB:  [birth year]  Interval Hx/subjective   Patient lying in bed. No family at bedside.  Left side weakness is much improved from yesterday's assessment post-op.   Vitals   Vitals:   12/06/23 0600 12/06/23 0619 12/06/23 0700 12/06/23 0800  BP: (!) 125/55  (!) 128/54 (!) 132/53  Pulse: 72 72 74 71  Resp: 17 18 18  (!) 21  Temp:      TempSrc:      SpO2: 96% 95% 95% 96%  Weight:      Height:         Body mass index is 20.35 kg/m.  Physical Exam    Constitutional: Appears elderly, well-developed and well-nourished.  Cardiovascular: Normal rate and regular rhythm.  Respiratory: Effort normal, non-labored breathing.    Neurologic Examination    Neuro: Mental Status: Patient is drowsy, oriented to person, place, month, year, situation.  Follows commands and answers questions Cranial Nerves: II: Visual Fields are full. Pupils are equal, round, and reactive to light.   III,IV, VI: EOMI.  V: Facial sensation is symmetric to light touch VII: Facial movement is symmetric.  VIII: hearing is intact to voice X: Uvula elevates symmetrically.  XI: Shoulder shrug is symmetric. XII: tongue is midline without atrophy or fasciculations.  Motor: Tone is normal. Bulk is normal. LUE: 4/5 with slight drift, 4+/5 grip LLE: 4/5 with no drift  Sensory: Sensation is symmetric to light touch in the arms and legs. Cerebellar: FNF/HKS symmetric No overt ataxia  Medications  Current Facility-Administered Medications:    0.9 %  sodium chloride  infusion, 250 mL, Intravenous, Continuous, Colon Shove, MD, Stopped at 12/05/23 1823   acetaminophen  (TYLENOL ) tablet 650 mg, 650 mg, Oral, Q4H PRN **OR** acetaminophen  (TYLENOL ) suppository 650 mg, 650 mg, Rectal, Q4H PRN, Colon Shove, MD   bisacodyl  (DULCOLAX) suppository 10 mg, 10 mg, Rectal, Daily PRN, Colon Shove, MD    brimonidine  (ALPHAGAN ) 0.2 % ophthalmic solution 1 drop, 1 drop, Right Eye, QHS, Mansy, Jan A, MD, 1 drop at 12/04/23 2122   Chlorhexidine  Gluconate Cloth 2 % PADS 6 each, 6 each, Topical, Daily, Harold Scholz, MD, 6 each at 12/06/23 9157   docusate sodium  (COLACE) capsule 100 mg, 100 mg, Oral, BID, Colon Shove, MD, 100 mg at 12/06/23 9157   folic acid  (FOLVITE ) tablet 1 mg, 1 mg, Oral, Daily, Mansy, Jan A, MD, 1 mg at 12/06/23 9156   hydroxyurea  (HYDREA ) capsule 500 mg, 500 mg, Oral, QID, Drusilla, Gagan S, MD, 500 mg at 12/06/23 9156   levothyroxine  (SYNTHROID ) tablet 50 mcg, 50 mcg, Oral, QODAY, Mansy, Jan A, MD, 50 mcg at 12/03/23 9066   levothyroxine  (SYNTHROID ) tablet 75 mcg, 75 mcg, Oral, QODAY, Mansy, Jan A, MD, 75 mcg at 12/06/23 0620   loratadine  (CLARITIN ) tablet 10 mg, 10 mg, Oral, Daily, Mansy, Jan A, MD, 10 mg at 12/06/23 9157   magnesium  hydroxide (MILK OF MAGNESIA) suspension 30 mL, 30 mL, Oral, Daily PRN, Mansy, Jan A, MD   menthol  (CEPACOL) lozenge 3 mg, 1 lozenge, Oral, PRN **OR** phenol (CHLORASEPTIC) mouth spray 1 spray, 1 spray, Mouth/Throat, PRN, Colon Shove, MD   methocarbamol  (ROBAXIN ) tablet 500 mg, 500 mg, Oral, Q6H PRN, 500 mg at 12/06/23 0620 **OR** methocarbamol  (ROBAXIN ) injection 500 mg, 500 mg, Intravenous, Q6H PRN, Colon Shove, MD   ondansetron  (ZOFRAN ) tablet 4 mg, 4 mg, Oral, Q6H PRN **OR** ondansetron  (  ZOFRAN ) injection 4 mg, 4 mg, Intravenous, Q6H PRN, Mansy, Jan A, MD, 4 mg at 12/05/23 1756   senna (SENOKOT) tablet 8.6 mg, 1 tablet, Oral, BID, Colon Shove, MD, 8.6 mg at 12/06/23 0843   sodium chloride  flush (NS) 0.9 % injection 3 mL, 3 mL, Intravenous, Q12H, Colon Shove, MD, 3 mL at 12/05/23 2149   sodium chloride  flush (NS) 0.9 % injection 3 mL, 3 mL, Intravenous, PRN, Colon Shove, MD   sodium phosphate  (FLEET) enema 1 enema, 1 enema, Rectal, Once PRN, Colon Shove, MD  Labs and Diagnostic Imaging   CBC:  Recent Labs  Lab 12/03/23 0312  12/05/23 1854  WBC 12.8* 10.9*  NEUTROABS  --  9.1*  HGB 10.7* 11.9*  HCT 31.1* 35.5*  MCV 116.0* 119.5*  PLT 545* 691*    Basic Metabolic Panel:  Lab Results  Component Value Date   NA 134 (L) 12/05/2023   K 4.0 12/05/2023   CO2 21 (L) 12/05/2023   GLUCOSE 146 (H) 12/05/2023   BUN 13 12/05/2023   CREATININE 0.81 12/05/2023   CALCIUM  9.2 12/05/2023   GFRNONAA >60 12/05/2023   GFRAA 69 (L) 08/02/2013   Lipid Panel:  Lab Results  Component Value Date   LDLCALC 122 (H) 12/06/2023   HgbA1c:  Lab Results  Component Value Date   HGBA1C 5.1 12/05/2023   Urine Drug Screen: No results found for: LABOPIA, COCAINSCRNUR, LABBENZ, AMPHETMU, THCU, LABBARB  Alcohol  Level No results found for: [facility] INR  Lab Results  Component Value Date   INR 1.12 07/27/2013   APTT  Lab Results  Component Value Date   APTT 38 (H) 07/27/2013   AED levels: No results found for: PHENYTOIN, ZONISAMIDE, LAMOTRIGINE, LEVETIRACETA  CT Head without contrast(Personally reviewed): No acute intracranial hemorrhage or evidence of an acute infarct.  Extensive pneumocephalus (greatest along the anterior frontal lobes and within the posterior fossa), likely related to the cervical spine surgery performed earlier today.  CT angio Head and Neck with contrast(Personally reviewed): No proximal intracranial large vessel occlusion identified.  Estimated 50% stenosis at the right ICA origin.  Estimated 50-60% stenosis at the left ICA origin.   Assessment   78 y.o. female with hx of HTN, polycythemia, degenerative spine disease who presented 9/15 with progressive left-sided weakness.  Imaging revealed C1 cervical spinal stenosis, s/p occiput decompression to C2 with resection of synovial cyst   Inpatient code stroke called from PACU. On exam, patient is lethargic but awakens to voice, oriented, full visual fields, no aphasia, bilateral leg(L>>R) weakness, increased in left with only ability  to wiggle toes on left leg.  With the improvement seen in her left leg weakness, stroke is highly unlikely. She has no other focal or sensory deficits, no aphasia or confusion.    Recommendations   - discontinue MRI  - discontinue echo - discontinue NIH  - no need for any further stroke workup, antithrombotics or neurological testing - Delirium precautions, out of bed as able.  Neurology will sign off. Please recall with any further questions or changes in neuro status. Thank you for this consult.  ______________________________________________________________________   Signed, Rocky JAYSON Likes, NP [facility]

## 2023-12-06 NOTE — TOC Progression Note (Signed)
 Transition of Care [facility]) - Progression Note    Patient Details  Name: Gabrielle Donovan MRN: [id number] Date of Birth: 20-Jun-1945  [facility] CM/SW Contact  Inocente GORMAN Kindle, LCSW Phone Number: 12/06/2023, 10:30 AM  Clinical Narrative:    Patient transferred to ICU. CSW following for medical stability to start insurance process for [facility].    Expected Discharge Plan: Skilled Nursing Facility Barriers to Discharge: Continued Medical Work up, English as a second language teacher               Expected Discharge Plan and Services   Discharge Planning Services: CM Consult Post Acute Care Choice: Skilled Nursing Facility Living arrangements for the past 2 months: Single Family Home Expected Discharge Date: 12/02/23                         HH Arranged: PT, OT HH Agency: [facility] Date HH Agency Contacted: 12/02/23 Time HH Agency Contacted: 1151 Representative spoke with at [facility] Agency: [place]   Social Drivers of Health (SDOH) Interventions SDOH Screenings   Food Insecurity: No Food Insecurity (11/29/2023)  Housing: Low Risk  (11/29/2023)  Transportation Needs: No Transportation Needs (11/29/2023)  Utilities: Not At Risk (11/29/2023)  Financial Resource Strain: Low Risk  (11/04/2022)   Received from [facility]  Physical Activity: Inactive (12/28/2021)   Received from [facility]  Social Connections: Moderately Integrated (11/29/2023)  Stress: No Stress Concern Present (12/28/2021)   Received from [facility]  Tobacco Use: Low Risk  (12/05/2023)  Health Literacy: Low Risk  (12/28/2021)   Received from [facility]    Readmission Risk Interventions     No data to display

## 2023-12-06 NOTE — Anesthesia Postprocedure Evaluation (Signed)
 Anesthesia Post Note  Patient: Gabrielle Donovan  Procedure(s) Performed: POSTERIOR SUBOCCIPITAL AND CERVICAL DECOMPRESSION CERVICAL ONE-CERVICAL TWO WITH RESECTION OF PANNUS (Neck)     Patient location during evaluation: PACU Anesthesia Type: General Level of consciousness: sedated and patient cooperative Pain management: pain level controlled Vital Signs Assessment: post-procedure vital signs reviewed and stable Respiratory status: spontaneous breathing Cardiovascular status: stable Anesthetic complications: no   No notable events documented.  Last Vitals:  Vitals:   12/06/23 2135 12/06/23 2337  BP: (!) 144/63 (!) 164/71  Pulse: 80 80  Resp: 18 18  Temp: 36.7 C 36.5 C  SpO2: 99% 98%    Last Pain:  Vitals:   12/06/23 2337  TempSrc: Oral  PainSc:                  [name]

## 2023-12-06 NOTE — Evaluation (Signed)
 Occupational Therapy Evaluation Patient Details Name: Gabrielle Donovan MRN: [id number] DOB: 1945/09/12 Today's Date: 12/06/2023   History of Present Illness   Gabrielle Donovan is an 78 yo female who presented 11/28/23 with progressive L sided weakness. Imaging revealed C1 cervical spinal stenosis, S/p steere decompression occiput to C2 with resection of synovial cyst at odontoid using transdural approach on 9/22. Code stroke called in PACU . CT head negative, CTA negative for LVO. MRI pending. PMHx:  OA, essential HTN, right sided low back pain, and polycythemia.     Clinical Impressions Pt reports ind she was mod I with ADLs PTA, ambulatory with RW/rollator. Pt's niece lives with her. Evaluation limited by nausea, pt needs min A to come to EOB and assist to don cervical collar. Pt sits EOB x5 min but reports too nauseated to attempt OOB mobility. Pt educated on cervical precautions but will benefit from reinforcement. Pt presenting with impairments listed below, will follow acutely. Plan to follow up for second session this PM to determine d/c recommendation and equipment needs.      If plan is discharge home, recommend the following:   A little help with walking and/or transfers;Assist for transportation;Help with stairs or ramp for entrance;Assistance with cooking/housework;A lot of help with bathing/dressing/bathroom     Functional Status Assessment   Patient has had a recent decline in their functional status and demonstrates the ability to make significant improvements in function in a reasonable and predictable amount of time.     Equipment Recommendations   Other (comment) (TBD)     Recommendations for Other Services   PT consult     Precautions/Restrictions   Precautions Precautions: Fall;Cervical Recall of Precautions/Restrictions: Impaired Required Braces or Orthoses: Cervical Brace Cervical Brace: Soft collar;At all times Restrictions Weight Bearing Restrictions  Per Provider Order: No     Mobility Bed Mobility Overal bed mobility: Needs Assistance Bed Mobility: Rolling, Sidelying to Sit, Sit to Sidelying Rolling: Contact guard assist Sidelying to sit: Min assist     Sit to sidelying: Min assist      Transfers                   General transfer comment: deferred 2/2 nausea      Balance Overall balance assessment: Needs assistance Sitting-balance support: Feet supported, Bilateral upper extremity supported Sitting balance-Leahy Scale: Fair                                     ADL either performed or assessed with clinical judgement   ADL Overall ADL's : Needs assistance/impaired Eating/Feeding: Minimal assistance   Grooming: Minimal assistance                                 General ADL Comments: will further assess when nausea more controlled     Vision   Additional Comments: keeps eyes closed due to nausea     Perception Perception: Not tested       Praxis Praxis: Not tested       Pertinent Vitals/Pain Pain Assessment Pain Assessment: No/denies pain     Extremity/Trunk Assessment Upper Extremity Assessment Upper Extremity Assessment: Generalized weakness (will further assess)   Lower Extremity Assessment Lower Extremity Assessment: Defer to PT evaluation   Cervical / Trunk Assessment Cervical / Trunk Assessment: Kyphotic   Communication Communication Communication: No  apparent difficulties   Cognition Arousal: Alert Behavior During Therapy: WFL for tasks assessed/performed Cognition: Difficult to assess             OT - Cognition Comments: difficult to assess due to nausea                 Following commands: Intact       Cueing  General Comments   Cueing Techniques: Verbal cues  VSS   Exercises     Shoulder Instructions      Home Living Family/patient expects to be discharged to:: Private residence Living Arrangements: Other (Comment)  (niece) Available Help at Discharge: Family;Available 24 hours/day Type of Home: Mobile home Home Access: Stairs to enter Entrance Stairs-Number of Steps: 2 Entrance Stairs-Rails: None Home Layout: One level     Bathroom Shower/Tub: Tub/shower unit;Walk-in shower   Bathroom Toilet: Standard     Home Equipment: Agricultural consultant (2 wheels);Rollator (4 wheels);Cane - single point;BSC/3in1;Shower seat;Wheelchair - manual          Prior Functioning/Environment Prior Level of Function : Independent/Modified Independent;Driving             Mobility Comments: Ambulates using RW vs rollator. Pt reports everything taking increased time d/t L hemi weakness. 1 recent fall on Sunday. ADLs Comments: ModI with ADLs/IADLs. Drives, cooks/cleans    OT Problem List: Decreased strength;Decreased range of motion;Decreased activity tolerance;Impaired balance (sitting and/or standing);Decreased safety awareness;Decreased knowledge of use of DME or AE;Decreased knowledge of precautions   OT Treatment/Interventions: Self-care/ADL training;Therapeutic exercise;DME and/or AE instruction;Therapeutic activities;Patient/family education;Balance training      OT Goals(Current goals can be found in the care plan section)   Acute Rehab OT Goals Patient Stated Goal: to lay down OT Goal Formulation: With patient Time For Goal Achievement: 12/29/23 Potential to Achieve Goals: Good   OT Frequency:  Min 2X/week    Co-evaluation              AM-PAC OT 6 Clicks Daily Activity     Outcome Measure Help from another person eating meals?: A Little Help from another person taking care of personal grooming?: A Little Help from another person toileting, which includes using toliet, bedpan, or urinal?: A Lot Help from another person bathing (including washing, rinsing, drying)?: A Lot Help from another person to put on and taking off regular upper body clothing?: A Little Help from another person to  put on and taking off regular lower body clothing?: A Lot 6 Click Score: 15   End of Session Nurse Communication: Mobility status  Activity Tolerance: Other (comment) (limited by nausea) Patient left: in bed;with call bell/phone within reach;with bed alarm set  OT Visit Diagnosis: Unsteadiness on feet (R26.81);Other abnormalities of gait and mobility (R26.89);Muscle weakness (generalized) (M62.81);History of falling (Z91.81)                Time: 9089-9073 OT Time Calculation (min): 16 min Charges:  OT General Charges $OT Visit: 1 Visit OT Evaluation $OT Eval Low Complexity: 1 Low  Shaquan Missey K, OTD, OTR/L SecureChat Preferred Acute Rehab [phone]   Josel Keo K Koonce 12/06/2023, 2:15 PM

## 2023-12-06 NOTE — Evaluation (Signed)
 Physical Therapy re-Evaluation Patient Details Name: Gabrielle Donovan MRN: [id number] DOB: [birth year] Today's Date: 12/06/2023  History of Present Illness  Gabrielle Donovan is an 78 yo female who presented 11/28/23 with progressive L sided weakness. Imaging revealed C1 cervical spinal stenosis, S/p steere decompression occiput to C2 with resection of synovial cyst at odontoid using transdural approach on 9/22. Code stroke called in PACU . CT head negative, CTA negative for LVO. MRI pending. PMHx:  OA, essential HTN, right sided low back pain, and polycythemia.  Clinical Impression   Pt presents with weakness L>R, impaired balance with history of falls, impaired gait, neck and L side pain, and decreased activity tolerance. Pt to benefit from acute PT to address deficits. Pt ambulated short room distance, overall requiring mod PT assist for steadying and bodyweight support. Pt states she wants to d/c home to her daughter's house, who can provide 24/7 assist and physical assist. If this is not an option may need to consider post-acute rehab. PT to progress mobility as tolerated, and will continue to follow acutely.          If plan is discharge home, recommend the following: A lot of help with bathing/dressing/bathroom;Assistance with cooking/housework;Assist for transportation;Help with stairs or ramp for entrance;A lot of help with walking and/or transfers   Can travel by private vehicle        Equipment Recommendations Wheelchair (measurements PT);Wheelchair cushion (measurements PT) (pt states she doesn''t have w/c)  Recommendations for Other Services       Functional Status Assessment Patient has had a recent decline in their functional status and demonstrates the ability to make significant improvements in function in a reasonable and predictable amount of time.     Precautions / Restrictions Precautions Precautions: Fall;Cervical Recall of Precautions/Restrictions:  Impaired Precaution/Restrictions Comments: pt recalls 0/3 cervical precautions, states no one has educated pt on them but OT did this am Required Braces or Orthoses: Cervical Brace Cervical Brace: Soft collar Restrictions Weight Bearing Restrictions Per Provider Order: No      Mobility  Bed Mobility               General bed mobility comments: up in chair    Transfers Overall transfer level: Needs assistance Equipment used: Rolling walker (2 wheels) Transfers: Sit to/from Stand Sit to Stand: Mod assist           General transfer comment: assist for power up, rise, steady, correcting posterior bias. stand x2.    Ambulation/Gait Ambulation/Gait assistance: Mod assist Gait Distance (Feet): 20 Feet Assistive device: Rolling walker (2 wheels) Gait Pattern/deviations: Step-to pattern, Decreased step length - left, Knee flexed in stance - left, Trunk flexed, Narrow base of support Gait velocity: decr     General Gait Details: assist to steady, guide RW, max cues for upright posture and placemetn in RW.  Stairs            Wheelchair Mobility     Tilt Bed    Modified Rankin (Stroke Patients Only)       Balance Overall balance assessment: Needs assistance Sitting-balance support: Feet supported, Bilateral upper extremity supported Sitting balance-Leahy Scale: Fair     Standing balance support: Bilateral upper extremity supported, During functional activity, Reliant on assistive device for balance Standing balance-Leahy Scale: Poor Standing balance comment: reliant on external                             Pertinent  Vitals/Pain Pain Assessment Pain Assessment: Faces Faces Pain Scale: Hurts little more Pain Location: L flank Pain Descriptors / Indicators: Grimacing, Guarding, Sore Pain Intervention(s): Limited activity within patient's tolerance, Monitored during session, Repositioned    Home Living Family/patient expects to be discharged  to:: Private residence Living Arrangements: Other (Comment) (plans to stay with daughter at d/c) Available Help at Discharge: Family;Available 24 hours/day Type of Home: House Home Access: Level entry Entrance Stairs-Rails: None Entrance Stairs-Number of Steps: per pt, daughter has no steps to enter home   Home Layout: One level Home Equipment: Agricultural consultant (2 wheels);Rollator (4 wheels);Cane - single point;BSC/3in1;Shower seat;Wheelchair - manual      Prior Function Prior Level of Function : Independent/Modified Independent;Driving             Mobility Comments: Pt states to PT she requires no AD PTA. Per OT, pt ambulates using RW vs rollator. Pt reports everything taking increased time d/t L hemi weakness. 1 recent fall on Sunday. ADLs Comments: ModI with ADLs/IADLs. Drives, cooks/cleans     Extremity/Trunk Assessment   Upper Extremity Assessment Upper Extremity Assessment: Defer to OT evaluation    Lower Extremity Assessment Lower Extremity Assessment: LLE deficits/detail;Generalized weakness LLE Deficits / Details: 4/5 knee extension, 3+/5 knee flexion, 3/5 hip abd/add    Cervical / Trunk Assessment Cervical / Trunk Assessment: Kyphotic;Neck Surgery  Communication   Communication Communication: No apparent difficulties    Cognition Arousal: Alert Behavior During Therapy: WFL for tasks assessed/performed   PT - Cognitive impairments: No family/caregiver present to determine baseline, Problem solving, Safety/Judgement                       PT - Cognition Comments: questionable history giving, pt states she was completely independent with no AD PTA but per OT's note pt using RW or rollator Following commands: Intact Following commands impaired: Follows one step commands with increased time, Follows multi-step commands inconsistently     Cueing Cueing Techniques: Verbal cues     General Comments General comments (skin integrity, edema, etc.): VSS     Exercises     Assessment/Plan    PT Assessment Patient needs continued PT services  PT Problem List Decreased activity tolerance;Decreased balance;Decreased mobility;Decreased strength;Decreased knowledge of use of DME;Decreased safety awareness       PT Treatment Interventions DME instruction;Gait training;Stair training;Functional mobility training;Therapeutic activities;Therapeutic exercise;Balance training;Neuromuscular re-education;Patient/family education    PT Goals (Current goals can be found in the Care Plan section)  Acute Rehab PT Goals Patient Stated Goal: Return Home PT Goal Formulation: With patient Time For Goal Achievement: 12/15/23 Potential to Achieve Goals: Fair    Frequency Min 2X/week     Co-evaluation               AM-PAC PT 6 Clicks Mobility  Outcome Measure Help needed turning from your back to your side while in a flat bed without using bedrails?: A Lot Help needed moving from lying on your back to sitting on the side of a flat bed without using bedrails?: A Lot Help needed moving to and from a bed to a chair (including a wheelchair)?: A Lot Help needed standing up from a chair using your arms (e.g., wheelchair or bedside chair)?: A Lot Help needed to walk in hospital room?: A Lot Help needed climbing 3-5 steps with a railing? : Total 6 Click Score: 11    End of Session Equipment Utilized During Treatment: Gait belt;Cervical collar Activity Tolerance: Patient  tolerated treatment well Patient left: in chair;with call bell/phone within reach;with chair alarm set Nurse Communication: Mobility status PT Visit Diagnosis: Muscle weakness (generalized) (M62.81);Difficulty in walking, not elsewhere classified (R26.2);Unsteadiness on feet (R26.81)    Time: 8697-8680 PT Time Calculation (min) (ACUTE ONLY): 17 min   Charges:   PT Evaluation $PT Re-evaluation: 1 Re-eval   PT General Charges $$ ACUTE PT VISIT: 1 Visit         Gabrielle Donovan, PT  DPT Acute Rehabilitation Services Secure Chat Preferred  Office [phone]   Gabrielle Donovan [place] 12/06/2023, 3:41 PM

## 2023-12-06 NOTE — Plan of Care (Signed)
  Problem: Education: Goal: Knowledge of General Education information will improve Description: Including pain rating scale, medication(s)/side effects and non-pharmacologic comfort measures Outcome: Not Progressing   Problem: Health Behavior/Discharge Planning: Goal: Ability to manage health-related needs will improve Outcome: Not Progressing   Problem: Clinical Measurements: Goal: Ability to maintain clinical measurements within normal limits will improve Outcome: Not Progressing Goal: Will remain free from infection Outcome: Not Progressing Goal: Diagnostic test results will improve Outcome: Not Progressing Goal: Respiratory complications will improve Outcome: Not Progressing Goal: Cardiovascular complication will be avoided Outcome: Not Progressing   Problem: Activity: Goal: Risk for activity intolerance will decrease Outcome: Not Progressing   Problem: Nutrition: Goal: Adequate nutrition will be maintained Outcome: Not Progressing   Problem: Coping: Goal: Level of anxiety will decrease Outcome: Not Progressing   Problem: Elimination: Goal: Will not experience complications related to bowel motility Outcome: Not Progressing Goal: Will not experience complications related to urinary retention Outcome: Not Progressing   Problem: Pain Managment: Goal: General experience of comfort will improve and/or be controlled Outcome: Not Progressing   Problem: Safety: Goal: Ability to remain free from injury will improve Outcome: Not Progressing   Problem: Skin Integrity: Goal: Risk for impaired skin integrity will decrease Outcome: Not Progressing   Problem: Education: Goal: Knowledge of disease or condition will improve Outcome: Not Progressing Goal: Knowledge of secondary prevention will improve (MUST DOCUMENT ALL) Outcome: Not Progressing Goal: Knowledge of patient specific risk factors will improve (DELETE if not current risk factor) Outcome: Not Progressing    Problem: Ischemic Stroke/TIA Tissue Perfusion: Goal: Complications of ischemic stroke/TIA will be minimized Outcome: Not Progressing   Problem: Coping: Goal: Will verbalize positive feelings about self Outcome: Not Progressing Goal: Will identify appropriate support needs Outcome: Not Progressing   Problem: Health Behavior/Discharge Planning: Goal: Ability to manage health-related needs will improve Outcome: Not Progressing Goal: Goals will be collaboratively established with patient/family Outcome: Not Progressing   Problem: Self-Care: Goal: Ability to participate in self-care as condition permits will improve Outcome: Not Progressing Goal: Verbalization of feelings and concerns over difficulty with self-care will improve Outcome: Not Progressing Goal: Ability to communicate needs accurately will improve Outcome: Not Progressing   Problem: Nutrition: Goal: Risk of aspiration will decrease Outcome: Not Progressing Goal: Dietary intake will improve Outcome: Not Progressing   Problem: Education: Goal: Ability to verbalize activity precautions or restrictions will improve Outcome: Not Progressing Goal: Knowledge of the prescribed therapeutic regimen will improve Outcome: Not Progressing Goal: Understanding of discharge needs will improve Outcome: Not Progressing   Problem: Activity: Goal: Ability to avoid complications of mobility impairment will improve Outcome: Not Progressing Goal: Ability to tolerate increased activity will improve Outcome: Not Progressing Goal: Will remain free from falls Outcome: Not Progressing   Problem: Bowel/Gastric: Goal: Gastrointestinal status for postoperative course will improve Outcome: Not Progressing   Problem: Clinical Measurements: Goal: Ability to maintain clinical measurements within normal limits will improve Outcome: Not Progressing Goal: Postoperative complications will be avoided or minimized Outcome: Not  Progressing Goal: Diagnostic test results will improve Outcome: Not Progressing   Problem: Pain Management: Goal: Pain level will decrease Outcome: Not Progressing

## 2023-12-06 NOTE — Progress Notes (Signed)
 Patient ID: Gabrielle Donovan, female   DOB: [birth year], 78 y.o.   MRN: [id number] Patient alert and moving lue though slightly weaker than before. Lower extremity function about the same. Continues supportive care. Ok for transfer from unit.

## 2023-12-06 NOTE — Progress Notes (Signed)
 Occupational Therapy Treatment Patient Details Name: Gabrielle Donovan MRN: [id number] DOB: 09-09-1945 Today's Date: 12/06/2023   History of present illness Gabrielle Donovan is an 78 yo female who presented 11/28/23 with progressive L sided weakness. Imaging revealed C1 cervical spinal stenosis, S/p steere decompression occiput to C2 with resection of synovial cyst at odontoid using transdural approach on 9/22. Code stroke called in PACU . CT head negative, CTA negative for LVO. MRI pending. PMHx:  OA, essential HTN, right sided low back pain, and polycythemia.   OT comments  Pt seen for second session this PM due to initial evaluation this AM being limited by nausea. Pt needs mod A for x2 step pivot transfer to [facility] and then to bed. Pt with mild LUE weakness and coordination deficits compared to RUE, along with R lateral lean in sitting and standing. Pt with recall of 1/3 cervical precautions, educated pt on precautions and provided with handout. Will benefit from continued education/reinforcement in future sessions.  At this time wants to return to her daughter's home at d/c and pt states daughter can provide level of assist needed. Pt presenting with impairments listed below, will follow acutely. Patient will benefit from continued inpatient follow up therapy, <3 hours/day to maximize safety/ind with ADL/functional mobility, if unable will need HHOT services and 24/7 assist at d/c.       If plan is discharge home, recommend the following:  Assist for transportation;Help with stairs or ramp for entrance;Assistance with cooking/housework;A lot of help with bathing/dressing/bathroom;A lot of help with walking and/or transfers   Equipment Recommendations  Tub/shower bench    Recommendations for Other Services PT consult    Precautions / Restrictions Precautions Precautions: Fall;Cervical Precaution Booklet Issued: Yes (comment) Recall of Precautions/Restrictions: Impaired Precaution/Restrictions  Comments: pt with recall 1/3 back precautions, needs cues to not lift/twist Required Braces or Orthoses: Cervical Brace Cervical Brace: Soft collar Restrictions Weight Bearing Restrictions Per Provider Order: No       Mobility Bed Mobility Overal bed mobility: Needs Assistance Bed Mobility: Rolling, Sidelying to Sit, Sit to Sidelying Rolling: Contact guard assist Sidelying to sit: Min assist     Sit to sidelying: Min assist General bed mobility comments: min A with cues to sequence log roll    Transfers Overall transfer level: Needs assistance Equipment used: Rolling walker (2 wheels) Transfers: Sit to/from Stand Sit to Stand: Mod assist     Step pivot transfers: Mod assist     General transfer comment: mod A from low chair surface and from [facility], pt with RLE sliding forward and incr time to weight shift/transition to bring hand from arm of chair to handle of RW     Balance Overall balance assessment: Needs assistance Sitting-balance support: Feet supported, Bilateral upper extremity supported Sitting balance-Leahy Scale: Fair Sitting balance - Comments: pt with R lateral lean when sitting supported and unsupported   Standing balance support: Bilateral upper extremity supported, During functional activity, Reliant on assistive device for balance Standing balance-Leahy Scale: Poor Standing balance comment: reliant on external support                           ADL either performed or assessed with clinical judgement   ADL Overall ADL's : Needs assistance/impaired Eating/Feeding: Minimal assistance   Grooming: Minimal assistance                   Toilet Transfer: Moderate assistance;Stand-pivot;BSC/3in1;Rolling walker (2 wheels) Toilet Transfer Details (  indicate cue type and reason): with BSC on L Toileting- Clothing Manipulation and Hygiene: Moderate assistance Toileting - Clothing Manipulation Details (indicate cue type and reason): for standing  balance while pt doffs undergarments     Functional mobility during ADLs: Moderate assistance;Rolling walker (2 wheels) General ADL Comments: will further assess when nausea more controlled    Extremity/Trunk Assessment Upper Extremity Assessment Upper Extremity Assessment: Left hand dominant;RUE deficits/detail;LUE deficits/detail RUE Deficits / Details: AROM limited to 90* shoulder level, decr FMC RUE Sensation: WNL RUE Coordination: decreased fine motor;decreased gross motor LUE Deficits / Details: mild weakness compared to RUE, esp pt LHD, decr FMC LUE Sensation: WNL LUE Coordination: decreased fine motor;decreased gross motor   Lower Extremity Assessment Lower Extremity Assessment: Defer to PT evaluation LLE Deficits / Details: 4/5 knee extension, 3+/5 knee flexion, 3/5 hip abd/add   Cervical / Trunk Assessment Cervical / Trunk Assessment: Kyphotic;Neck Surgery    Vision   Additional Comments: reports having cataract surgery on R eye, R eye with eyelid ptosis pt states is baseline, pt also reports having glaucoma, vision baseline per pt   Perception Perception Perception: Not tested   Praxis Praxis Praxis: Not tested   Communication Communication Communication: No apparent difficulties   Cognition Arousal: Alert Behavior During Therapy: WFL for tasks assessed/performed Cognition: Cognition impaired     Awareness: Online awareness impaired, Intellectual awareness impaired Memory impairment (select all impairments): Short-term memory     OT - Cognition Comments: decr awareness of overall deficits/safety, recalled only 1/3 cervical precautions despite being eduated this AM (handout now provided)                 Following commands: Intact Following commands impaired: Follows one step commands with increased time      Cueing   Cueing Techniques: Verbal cues  Exercises      Shoulder Instructions       General Comments VSS on RA    Pertinent Vitals/  Pain       Pain Assessment Pain Assessment: Faces Pain Score: 4  Faces Pain Scale: Hurts little more Pain Location: neck with C collar adjustment Pain Descriptors / Indicators: Grimacing, Guarding, Sore Pain Intervention(s): Limited activity within patient's tolerance, Monitored during session, Repositioned  Home Living Family/patient expects to be discharged to:: Private residence Living Arrangements: Other (Comment) (plans to stay with daughter at d/c) Available Help at Discharge: Family;Available 24 hours/day Type of Home: House Home Access: Level entry Entrance Stairs-Number of Steps: per pt, daughter has no steps to enter home Entrance Stairs-Rails: None Home Layout: One level     Bathroom Shower/Tub: Tub/shower unit;Walk-in shower   Bathroom Toilet: Standard     Home Equipment: Agricultural consultant (2 wheels);Rollator (4 wheels);Cane - single point;BSC/3in1;Shower seat;Wheelchair - manual          Prior Functioning/Environment              Frequency  Min 2X/week        Progress Toward Goals  OT Goals(current goals can now be found in the care plan section)  Progress towards OT goals: Progressing toward goals  Acute Rehab OT Goals Patient Stated Goal: none stated OT Goal Formulation: With patient Time For Goal Achievement: 12/29/23 Potential to Achieve Goals: Good ADL Goals Pt Will Perform Grooming: with contact guard assist;standing Pt Will Perform Upper Body Dressing: with min assist;sitting Pt Will Perform Lower Body Dressing: with min assist;sitting/lateral leans;sit to/from stand Pt Will Transfer to Toilet: ambulating;regular height toilet;with contact guard assist Pt  Will Perform Tub/Shower Transfer: Tub transfer;Shower transfer;with min assist;ambulating;shower seat;rolling walker Additional ADL Goal #1: pt will perform functional mobility/ADL tasks with min cues for cervical precautions in prep for ADLs Additional ADL Goal #2: Pt will tolerate  standing OOB functional activity x5 min in order to improve strength, balance, and activity tolerance for ADLs  Plan      Co-evaluation                 AM-PAC OT 6 Clicks Daily Activity     Outcome Measure   Help from another person eating meals?: A Little Help from another person taking care of personal grooming?: A Little Help from another person toileting, which includes using toliet, bedpan, or urinal?: A Lot Help from another person bathing (including washing, rinsing, drying)?: A Lot Help from another person to put on and taking off regular upper body clothing?: A Lot Help from another person to put on and taking off regular lower body clothing?: A Lot 6 Click Score: 14    End of Session Equipment Utilized During Treatment: Gait belt;Rolling walker (2 wheels)  OT Visit Diagnosis: Unsteadiness on feet (R26.81);Other abnormalities of gait and mobility (R26.89);Muscle weakness (generalized) (M62.81);History of falling (Z91.81)   Activity Tolerance Patient tolerated treatment well   Patient Left in bed;with call bell/phone within reach;with bed alarm set   Nurse Communication Mobility status        Time: 8540-8481 OT Time Calculation (min): 19 min  Charges: OT General Charges $OT Visit: 1 Visit OT Treatments $Self Care/Home Management : 8-22 mins  Terrian Ridlon K, OTD, OTR/L SecureChat Preferred Acute Rehab [phone]   Laneta POUR Koonce 12/06/2023, 4:32 PM

## 2023-12-06 NOTE — Progress Notes (Signed)
 NAME:  Gabrielle Donovan, MRN:  [id number], DOB:  27-Nov-1945, LOS: 8 ADMISSION DATE:  11/28/2023, CONSULTATION DATE:  12/05/23 REFERRING MD:  Colon , CHIEF COMPLAINT:  s/p  C2 decompression, synovial cyst resection   History of Present Illness:  78 yo F PMH OA, HTN, prior cervical laminectomy who presented to ED 11/28/23 w CC extremity weakness found to have  spinal cord compression at craniocervical junction 2/2 pannus at level of C2 vertebra -- for which NSGY was consulted. Intended for discharge 9/19 but did not ultimately have safe home dispo so remained inpatient until surgery. Pt went for posterior cervical fusion/foraminotomy level of C2 12/05/23 with Dr. Colon. Case unremarkable    Post operatively she had L sided weakness for which a code stroke was called. CT H neg for acute hemorrhage, CTA neg for LVO. Not a candidate for tnk given spine surgery same day.   She was transferred to ICU in this setting and PCCM consulted   Pertinent  Medical History  HTN DDD Cervical myelopathy Polycythemia OA   Significant Hospital Events: Including procedures, antibiotic start and stop dates in addition to other pertinent events   9/15 admit for extremity wknss, C1-C2 bulky pannus causing cord compression 9/16 NSGY consult 9/22 OR for posterior suboccipital cervical decompression C1-C2, with pannus decompression. Code stroke post op for LUE weakness  9/23: stable from yesterday, transfer out of ICU   Interim History / Subjective:  POD1 Awake, alert, oriented. Wanting to eat and have coffee.   Objective    Blood pressure (!) 125/55, pulse 72, temperature 99.1 F (37.3 C), temperature source Oral, resp. rate 17, height 5' 6 (1.676 m), weight 57.2 kg, SpO2 96%.        Intake/Output Summary (Last 24 hours) at 12/06/2023 0802 Last data filed at 12/06/2023 0600 Gross per 24 hour  Intake 1218.35 ml  Output 1050 ml  Net 168.35 ml   Filed Weights   11/28/23 1609 12/05/23 1033  Weight: 57.2  kg 57.2 kg    Examination: General: elderly female, laying in bed, no acute distress  HEENT: [place]/AT, eyes anicteric. mucous membranes dry, no soft collar today Neuro: awake, alert, oriented, tongue protrusion to the left. Mildly weaker L>R but antigravity with all 4 extremities  Chest: clear bilaterally, room air  Heart: Regular rate and rhythm, no murmurs or gallops Abdomen: Soft, nontender, nondistended, bowel sounds present  New labs are pending Images reviewed  Patient Lines/Drains/Airways Status     Active Line/Drains/Airways     Name Placement date Placement time Site Days   Peripheral IV 12/03/23 22 G 1.75 Anterior;Left Forearm 12/03/23  0647  Forearm  2   Peripheral IV 12/05/23 18 G Anterior;Right Forearm 12/05/23  1200  Forearm  less than 1   Urethral Catheter esther ratcliff rn Latex;Straight-tip 14 Fr. 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 Can replace if having more pain/spasm   L Sided weakness with facial droop and dysarthria, likely due  to acute stroke Patient presented with left-sided weakness prior to surgery. Postsurgery she was noted to have worsening of left-sided weakness compared to right with left facial droop, question of acute stroke.  Code stroke was called. Stat head CT was done which showed no acute abnormalities except extensive pneumocephalus. CTA is negative for LVO - MRI brain pending  - not candidate for TNK or thrombectomy  - neuro following appreciate  management  - secondary prophylaxis   HTN - hold home medications for now - BP stable  Hypothyroidism  - con't synthroid    Polycythemia vera Continue hydroxyurea    Stopping oxycodone  and trazodone  today. Appears she only had 3 day supply outpatient. Would prefer given age and neuro concerns we not cloud this. Discontinued. Will transfer out to med surg. She needs PT/OT, MRI brain. Sign out to [facility] for pick up 9/24.  Labs   CBC: Recent Labs  Lab 12/03/23 0312 12/05/23 1854  WBC 12.8* 10.9*  NEUTROABS  --  9.1*  HGB 10.7* 11.9*  HCT 31.1* 35.5*  MCV 116.0* 119.5*  PLT 545* 691*    Basic Metabolic Panel: Recent Labs  Lab 12/03/23 0312 12/05/23 1854  NA 135 134*  K 3.8 4.0  CL 103 98  CO2 22 21*  GLUCOSE 94 146*  BUN 16 13  CREATININE 0.77 0.81  CALCIUM  8.9 9.2  MG  --  2.0  PHOS  --  3.8   GFR: Estimated Creatinine Clearance: 50 mL/min (by C-G formula based on SCr of 0.81 mg/dL). Recent Labs  Lab 12/03/23 0312 12/05/23 1854  WBC 12.8* 10.9*    Liver Function Tests: No results for input(s): AST, ALT, ALKPHOS, BILITOT, PROT, ALBUMIN in the last 168 hours. No results for input(s): LIPASE, AMYLASE in the last 168 hours. No results for input(s): AMMONIA in the last 168 hours.  ABG No results found for: PHART, PCO2ART, PO2ART, HCO3, TCO2, ACIDBASEDEF, O2SAT   Coagulation Profile: No results for input(s): INR, PROTIME in the last 168 hours.  Cardiac Enzymes: No results for input(s): CKTOTAL, CKMB, CKMBINDEX, TROPONINI in the last 168 hours.  HbA1C: Hgb A1c MFr Bld  Date/Time Value Ref Range Status  12/05/2023 06:54 PM 5.1 4.8 - 5.6 % Final    Comment:    (NOTE) Diagnosis of Diabetes The following HbA1c ranges recommended by the American Diabetes Association (ADA) may be used as an aid in the diagnosis of diabetes mellitus.  Hemoglobin             Suggested A1C NGSP%              Diagnosis  <5.7                    Non Diabetic  5.7-6.4                Pre-Diabetic  >6.4                   Diabetic  <7.0                   Glycemic control for                       adults with diabetes.      CBG: Recent Labs  Lab 12/05/23 1643  GLUCAP 147*    Review of Systems:   As above  Past Medical History:  She,  has a past medical history of Anemia, Arthritis, Cervical myelopathy (HCC),  Diverticulitis, Full dentures, GI bleed, Glaucoma, Headache, History of blood transfusion, Hypertension, Lumbago with sciatica, right side, Polycythemia, Thrombocytosis, and Wears glasses.   Surgical History:   Past Surgical History:  Procedure Laterality Date   ABDOMINAL HYSTERECTOMY     CATARACT EXTRACTION, BILATERAL     FRACTURE SURGERY     right wrist   POSTERIOR CERVICAL FUSION/FORAMINOTOMY N/A 01/18/2019   Procedure: Posterior decompression ring of Cervical one;  Surgeon: Colon Shove, MD;  Location: [facility] OR;  Service: Neurosurgery;  Laterality: N/A;   POSTERIOR CERVICAL LAMINECTOMY N/A 01/18/2019   Procedure: POSTERIOR CERVICAL WOUND EXPLORATION;  Surgeon: Colon Shove, MD;  Location: [facility] OR;  Service: Neurosurgery;  Laterality: N/A;   TOTAL HIP ARTHROPLASTY Right 07/31/2013   Procedure: RIGHT TOTAL HIP ARTHROPLASTY ANTERIOR APPROACH;  Surgeon: Donnice JONETTA Car, MD;  Location: [facility] ORS;  Service: Orthopedics;  Laterality: Right;   TUBAL LIGATION       Social History:   reports that she has never smoked. She has never used smokeless tobacco. She reports that she does not drink alcohol  and does not use drugs.   Family History:  Her family history includes Hypertension in her mother.   Allergies Allergies  Allergen Reactions   Codeine Nausea And Vomiting   Hydrocodone  Nausea And Vomiting     Home Medications  Prior to Admission medications   Medication Sig Start Date End Date Taking? Authorizing Provider  aspirin  EC 81 MG tablet Take 81 mg by mouth daily. Swallow whole.   Yes [provider]  brimonidine  (ALPHAGAN ) 0.2 % ophthalmic solution Place 1 drop into the right eye at bedtime.   Yes [provider]  cetirizine (ZYRTEC) 10 MG tablet Take 10 mg by mouth daily. 09/20/22  Yes [provider]  folic acid  (FOLVITE ) 1 MG tablet Take 1 mg by mouth daily.   Yes [provider]  hydroxyurea  (HYDREA ) 500 MG capsule Take 500 mg by mouth in the morning, at noon, in the evening, and at bedtime.   Yes [provider]  SYNTHROID  50 MCG tablet Take 50 mcg by mouth every other day. Alternating with 75mcg 08/31/21  Yes [provider]  SYNTHROID  75 MCG tablet Take 75 mcg by mouth every other day. Alternating with 5mcg   Yes [provider]  carvedilol  (COREG ) 3.125 MG tablet Take 1 tablet (3.125 mg total) by mouth 2 (two) times daily with a meal. 12/02/23   Danford, Lonni SQUIBB, MD  chlorthalidone  (HYGROTON ) 25 MG tablet Take 1 tablet (25 mg total) by mouth daily as needed (for high BP). 12/02/23   Danford, Lonni SQUIBB, MD  latanoprost  (XALATAN ) 0.005 % ophthalmic solution Place 1 drop into the right eye at bedtime. Patient not taking: Reported on 11/29/2023 10/25/18   [provider]  oxyCODONE  (OXY IR/ROXICODONE ) 5 MG immediate release tablet Take 1 tablet (5 mg total) by mouth every 6 (six) hours as needed for severe pain (pain score 7-10). 12/02/23   Jonel Lonni SQUIBB, MD     Tinnie FORBES Furth, PA-C [facility] 12/06/23 8:04 AM  Please see [email or web address] for pager details.  From 7A-7P if no response, please call [phone] After hours, please call ELink [phone]

## 2023-12-06 NOTE — Progress Notes (Signed)
 Patient refusing MRI again. Patient is alert and oriented x4. Educated patient again on purpose and importance of MRI. When asked the reasoning for declining she states I just dont feel good, I hate to be a pain but I just dont want to go. Patient could not offer up any solutions on how to make her feel better or what what is making her feel bad. Pain is controlled, no nausea, pt repositioned in the bed and resting comfortably.

## 2023-12-07 DIAGNOSIS — G959 Disease of spinal cord, unspecified: Secondary | ICD-10-CM | POA: Diagnosis not present

## 2023-12-07 LAB — BASIC METABOLIC PANEL WITH GFR
Anion gap: 10 (ref 5–15)
BUN: 12 mg/dL (ref 8–23)
CO2: 24 mmol/L (ref 22–32)
Calcium: 9.2 mg/dL (ref 8.9–10.3)
Chloride: 99 mmol/L (ref 98–111)
Creatinine, Ser: 0.61 mg/dL (ref 0.44–1.00)
GFR, Estimated: >60 mL/min (ref >60)
Glucose, Bld: 109 mg/dL — ABNORMAL HIGH (ref 70–99)
Potassium: 3.9 mmol/L (ref 3.5–5.1)
Sodium: 133 mmol/L — ABNORMAL LOW (ref 135–145)

## 2023-12-07 LAB — CBC WITH DIFFERENTIAL/PLATELET
Abs Immature Granulocytes: 0.1 K/uL — ABNORMAL HIGH (ref 0.00–0.07)
Band Neutrophils: 0 %
Basophils Absolute: 0.1 K/uL (ref 0.0–0.1)
Basophils Relative: 1 %
Eosinophils Absolute: 0 K/uL (ref 0.0–0.5)
Eosinophils Relative: 0 %
HCT: 34.5 % — ABNORMAL LOW (ref 36.0–46.0)
Hemoglobin: 11.9 g/dL — ABNORMAL LOW (ref 12.0–15.0)
Immature Granulocytes: 1 %
Lymphocytes Relative: 10 %
Lymphs Abs: 1.4 K/uL (ref 0.7–4.0)
MCH: 39.9 pg — ABNORMAL HIGH (ref 26.0–34.0)
MCHC: 34.5 g/dL (ref 30.0–36.0)
MCV: 115.8 fL — ABNORMAL HIGH (ref 80.0–100.0)
Monocytes Absolute: 0.9 K/uL (ref 0.1–1.0)
Monocytes Relative: 6 %
Neutro Abs: 11.9 K/uL — ABNORMAL HIGH (ref 1.7–7.7)
Neutrophils Relative %: 82 %
Platelets: 745 K/uL — ABNORMAL HIGH (ref 150–400)
RBC: 2.98 MIL/uL — ABNORMAL LOW (ref 3.87–5.11)
RDW: 23.9 % — ABNORMAL HIGH (ref 11.5–15.5)
WBC: 14.4 K/uL — ABNORMAL HIGH (ref 4.0–10.5)
nRBC: 1.9 % — ABNORMAL HIGH (ref 0.0–0.2)
nRBC: 2 /100{WBCs} — ABNORMAL HIGH

## 2023-12-07 LAB — SURGICAL PATHOLOGY

## 2023-12-07 MED ORDER — ATORVASTATIN CALCIUM 40 MG PO TABS
40.0000 mg | ORAL_TABLET | Freq: Every day | ORAL | Status: DC
  Administered 2023-12-07: 40 mg via ORAL
  Filled 2023-12-07: qty 1

## 2023-12-07 MED ORDER — CARVEDILOL 3.125 MG PO TABS
3.1250 mg | ORAL_TABLET | Freq: Two times a day (BID) | ORAL | Status: DC
  Administered 2023-12-07: 3.125 mg via ORAL
  Filled 2023-12-07: qty 1

## 2023-12-07 MED ORDER — OXYCODONE HCL 5 MG PO TABS: 5.0000 mg | ORAL_TABLET | Freq: Four times a day (QID) | ORAL | 0 refills | Status: AC | PRN

## 2023-12-07 MED ORDER — ATORVASTATIN CALCIUM 40 MG PO TABS: 40.0000 mg | ORAL_TABLET | Freq: Every day | ORAL | 0 refills | Status: AC

## 2023-12-07 NOTE — TOC Progression Note (Signed)
 Transition of Care [facility]: [facility]) - Progression Note    Patient Details  Name: Gabrielle Donovan MRN: [id number] Date of Birth: December 04, 1945  Transition of Care [facility]) CM/SW Contact  Almarie CHRISTELLA Goodie, [place] Phone Number: 12/07/2023, 11:37 AM  Clinical Narrative:   CSW met with patient and confirmed plan to admit to [facility]. CSW contacted CMA to request initiation of insurance authorization, and updated [employer]. CSW to follow.    Expected Discharge Plan: Skilled Nursing Facility Barriers to Discharge: Continued Medical Work up, English as a second language teacher               Expected Discharge Plan and Services   Discharge Planning Services: CM Consult Post Acute Care Choice: Skilled Nursing Facility Living arrangements for the past 2 months: Single Family Home Expected Discharge Date: 12/02/23                         HH Arranged: PT, OT HH Agency: [facility] Date HH Agency Contacted: 12/02/23 Time HH Agency Contacted: 1151 Representative spoke with at [facility] Agency: [place]   Social Drivers of Health (SDOH) Interventions SDOH Screenings   Food Insecurity: No Food Insecurity (11/29/2023)  Housing: Low Risk  (11/29/2023)  Transportation Needs: No Transportation Needs (11/29/2023)  Utilities: Not At Risk (11/29/2023)  Financial Resource Strain: Low Risk  (11/04/2022)   Received from [facility]  Physical Activity: Inactive (12/28/2021)   Received from [facility]  Social Connections: Moderately Integrated (11/29/2023)  Stress: No Stress Concern Present (12/28/2021)   Received from [facility]  Tobacco Use: Low Risk  (12/05/2023)  Health Literacy: Low Risk  (12/28/2021)   Received from [facility]    Readmission Risk Interventions     No data to display

## 2023-12-07 NOTE — TOC Transition Note (Signed)
 Transition of Care [facility]) - Discharge Note   Patient Details  Name: Gabrielle Donovan MRN: [id number] Date of Birth: 1945-03-20  Transition of Care [facility]) CM/SW Contact:  Montie LOISE Louder, LCSW Phone Number: 12/07/2023, 3:34 PM   Clinical Narrative:     Patient will Discharge to: [facility] Discharge Date: 12/07/23 Transport By: [facility]  Per MD patient is ready for discharge. RN, patient, and facility notified of discharge. Discharge Summary sent to facility. RN given number for [phone], Room 104. Ambulance transport requested for patient.   Clinical Social Worker signing off.  Montie Louder, MSW, LCSW Clinical Social Worker     Final next level of care: Skilled Nursing Facility Barriers to Discharge: Continued Medical Work up, English as a second language teacher   Patient Goals and CMS Choice   CMS Medicare.gov Compare Post Acute Care list provided to:: Patient Choice offered to / list presented to : Patient      Discharge Placement                       Discharge Plan and Services Additional resources added to the After Visit Summary for     Discharge Planning Services: CM Consult Post Acute Care Choice: Skilled Nursing Facility                    HH Arranged: PT, OT [facility] Agency: [facility] Date [facility] Agency Contacted: 12/02/23 Time HH Agency Contacted: 1151 Representative spoke with at [facility] Agency: [place]  Social Drivers of Health (SDOH) Interventions SDOH Screenings   Food Insecurity: No Food Insecurity (11/29/2023)  Housing: Low Risk  (11/29/2023)  Transportation Needs: No Transportation Needs (11/29/2023)  Utilities: Not At Risk (11/29/2023)  Financial Resource Strain: Low Risk  (11/04/2022)   Received from [facility]  Physical Activity: Inactive (12/28/2021)   Received from [facility]  Social Connections: Moderately Integrated (11/29/2023)  Stress: No Stress Concern Present (12/28/2021)   Received from [facility]   Tobacco Use: Low Risk  (12/05/2023)  Health Literacy: Low Risk  (12/28/2021)   Received from [facility]     Readmission Risk Interventions     No data to display

## 2023-12-07 NOTE — Progress Notes (Signed)
 Physical Therapy Treatment Patient Details Name: Gabrielle Donovan MRN: [id number] DOB: [birth year] Today's Date: 12/07/2023   History of Present Illness Gabrielle Donovan is an 78 yo female who presented 11/28/23 with progressive L sided weakness. Imaging revealed C1 cervical spinal stenosis, S/p steere decompression occiput to C2 with resection of synovial cyst at odontoid using transdural approach on 9/22. Code stroke called in PACU . CT head negative, CTA negative for LVO. MRI pending. PMHx:  OA, essential HTN, right sided low back pain, and polycythemia.    PT Comments  Pt received in supine and agreeable to session. Pt requires increased time to initiate mobility tasks due to weakness and fatigue. Pt demonstrates posterior bias during initial stand requiring increased cues and assist to correct. Pt able to tolerate gait trial with mod A due to weakness, impaired balance, and difficulty advancing LLE. Pt demonstrates low foot clearance during ambulation, but is able to lift BLE during static marches. Pt continues to benefit from PT services to progress toward functional mobility goals.     If plan is discharge home, recommend the following: A lot of help with bathing/dressing/bathroom;Assistance with cooking/housework;Assist for transportation;Help with stairs or ramp for entrance;A lot of help with walking and/or transfers   Can travel by private vehicle     Yes  Equipment Recommendations  Wheelchair (measurements PT);Wheelchair cushion (measurements PT)    Recommendations for Other Services       Precautions / Restrictions Precautions Precautions: Fall;Cervical Recall of Precautions/Restrictions: Impaired Precaution/Restrictions Comments: Pt able to recall with cues Required Braces or Orthoses: Cervical Brace Cervical Brace: Soft collar     Mobility  Bed Mobility Overal bed mobility: Needs Assistance Bed Mobility: Sidelying to Sit, Sit to Sidelying, Rolling Rolling: Contact guard  assist Sidelying to sit: Min assist     Sit to sidelying: Contact guard assist General bed mobility comments: Min A to scoot hips towards EOB. Cues for technique    Transfers Overall transfer level: Needs assistance Equipment used: Rolling walker (2 wheels) Transfers: Sit to/from Stand Sit to Stand: Mod assist           General transfer comment: STS from EOB with mod A for power up and anterior weight shift due to posterior bias. cues for hand placement    Ambulation/Gait Ambulation/Gait assistance: Mod assist Gait Distance (Feet): 20 Feet Assistive device: Rolling walker (2 wheels) Gait Pattern/deviations: Step-to pattern, Decreased step length - left, Knee flexed in stance - left, Trunk flexed, Narrow base of support, Decreased stride length, Decreased weight shift to right Gait velocity: decr     General Gait Details: assist for balance, weight shifting, and RW management. Cues for upright posture and increased foot clearance. Increased difficulty advancing LLE, but improved some with assist for weight shifting   Stairs             Wheelchair Mobility     Tilt Bed    Modified Rankin (Stroke Patients Only)       Balance Overall balance assessment: Needs assistance Sitting-balance support: Feet supported, Bilateral upper extremity supported, No upper extremity supported Sitting balance-Leahy Scale: Fair Sitting balance - Comments: CGA sitting EOB. Pt tending to rock forward and backward, but no LOB   Standing balance support: Bilateral upper extremity supported, During functional activity, Reliant on assistive device for balance Standing balance-Leahy Scale: Poor Standing balance comment: reliant on external support  Communication Communication Communication: No apparent difficulties  Cognition Arousal: Alert Behavior During Therapy: WFL for tasks assessed/performed   PT - Cognitive impairments: No  family/caregiver present to determine baseline, Problem solving, Safety/Judgement                         Following commands: Intact Following commands impaired: Follows one step commands with increased time    Cueing Cueing Techniques: Verbal cues  Exercises General Exercises - Lower Extremity Hip Flexion/Marching: AROM, Standing, Both, 5 reps    General Comments        Pertinent Vitals/Pain Pain Assessment Pain Assessment: 0-10 Pain Score: 5  Pain Location: L side, neck when donning collar Pain Descriptors / Indicators: Grimacing, Guarding, Sore Pain Intervention(s): Limited activity within patient's tolerance, Monitored during session, Repositioned     PT Goals (current goals can now be found in the care plan section) Acute Rehab PT Goals Patient Stated Goal: Return Home PT Goal Formulation: With patient Time For Goal Achievement: 12/15/23 Progress towards PT goals: Progressing toward goals    Frequency    Min 2X/week       AM-PAC PT 6 Clicks Mobility   Outcome Measure  Help needed turning from your back to your side while in a flat bed without using bedrails?: A Little Help needed moving from lying on your back to sitting on the side of a flat bed without using bedrails?: A Little Help needed moving to and from a bed to a chair (including a wheelchair)?: A Lot Help needed standing up from a chair using your arms (e.g., wheelchair or bedside chair)?: A Lot Help needed to walk in hospital room?: A Lot Help needed climbing 3-5 steps with a railing? : Total 6 Click Score: 13    End of Session Equipment Utilized During Treatment: Gait belt;Cervical collar Activity Tolerance: Patient tolerated treatment well Patient left: in bed;with call bell/phone within reach;with bed alarm set Nurse Communication: Mobility status PT Visit Diagnosis: Muscle weakness (generalized) (M62.81);Difficulty in walking, not elsewhere classified (R26.2);Unsteadiness on feet  (R26.81)     Time: 8467-8449 PT Time Calculation (min) (ACUTE ONLY): 18 min  Charges:    $Gait Training: 8-22 mins PT General Charges $$ ACUTE PT VISIT: 1 Visit                     Darryle George, PTA Acute Rehabilitation Services Secure Chat Preferred  [phone]    Darryle George 12/07/2023, 4:02 PM

## 2023-12-07 NOTE — Progress Notes (Addendum)
 PROGRESS NOTE    Gabrielle Donovan  FMW:[id number] DOB: 1946/02/22 DOA: 11/28/2023 PCP: Lari Elspeth BRAVO, MD   Brief Narrative:  78 y.o. female with hx of HTN, polycythemia, degenerative spine disease who presented 9/15 with progressive left-sided weakness.  Imaging revealed C1 cervical spinal stenosis, s/p occiput decompression to C2 with resection of synovial cyst. Post operatively she had L sided weakness for which a code stroke was called. CT H neg for acute hemorrhage, CTA neg for LVO. Not a candidate for tnk given spine surgery same day.  She was transferred to ICU in this setting and PCCM consulted.  Eventually transferred under [facility] on 12/07/2023.  Significant Hospital Events: Including procedures, antibiotic start and stop dates in addition to other pertinent events   9/15 admit for extremity wknss, C1-C2 bulky pannus causing cord compression 9/16 NSGY consult 9/22 OR for posterior suboccipital cervical decompression C1-C2, with pannus decompression. 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  Followed by neurosurgery, no further intervention planned.  Seen by PT OT, they recommended SNF.  TOC following.   L Sided weakness with facial droop and dysarthria, likely due to acute stroke Patient presented with left-sided weakness prior to surgery. Postsurgery she was noted to have worsening of left-sided weakness compared to right with left facial droop and some dysarthria.  Code stroke was called. Stat head CT was done  which showed no acute abnormalities except extensive pneumocephalus. CTA is negative for LVO.  She was not a candidate for TNK or thrombectomy due to having the surgery same day.MRI brain could not be completed and since her symptoms are improving, neurology canceled MRI brain.  Neurology signed off.  HTN Blood pressure slowly rising, resume home dose of Coreg  3.125 mg p.o. twice daily.   Hypothyroidism  - con't synthroid     Polycythemia vera Continue hydroxyurea   Hyperlipidemia: LDL 122.  Start on atorvastatin  40 mg.  DVT prophylaxis: SCD's Start: 12/05/23 1751   Code Status: Limited: Do not attempt resuscitation (DNR) -DNR-LIMITED -Do Not Intubate/DNI   Family Communication:  None present at bedside.  Plan of care discussed with patient in length and he/she verbalized understanding and agreed with it.  Status is: Inpatient Remains inpatient appropriate because: Medically stable, pending placement.   Estimated body mass index is 20.35 kg/m as calculated from the following:   Height as of this encounter: 5' 6 (1.676 m).   Weight as of this encounter: 57.2 kg.    Nutritional Assessment: Body mass index is 20.35 kg/m.SABRA Seen by dietician.  I agree with the assessment and plan as outlined below: Nutrition Status:        . Skin Assessment: I have examined the patient's skin and I agree with the wound assessment as performed by the wound care RN as outlined below:    Consultants:  PCCM-signed off Neurosurgery-following  Procedures:  Above  Antimicrobials:  Anti-infectives (From admission, onward)    Start     Dose/Rate Route Frequency Ordered Stop   12/05/23 2130  ceFAZolin  (ANCEF ) IVPB 2g/100 mL premix  2 g 200 mL/hr over 30 Minutes Intravenous Every 8 hours 12/05/23 1751 12/06/23 0527         Subjective: Patient seen and examined, she is working with SLP.  She is fluent in speech, she has no complaints.  Discussed with SNF recommendations from PT, she  is in agreement.  Sent message to [facility] to discuss with her.  Objective: Vitals:   12/06/23 2135 12/06/23 2337 12/07/23 0404 12/07/23 0800  BP: (!) 144/63 (!) 164/71 (!) 159/76 (!) 148/75  Pulse: 80 80 80 84  Resp: 18 18 18 19   Temp: 98 F (36.7 C) 97.7 F (36.5 C) 98.5 F (36.9 C) 98.4 F (36.9 C)  TempSrc: Oral Oral Oral   SpO2: 99% 98% 98% 98%  Weight:      Height:        Intake/Output Summary (Last 24 hours) at 12/07/2023 1019 Last data filed at 12/06/2023 2042 Gross per 24 hour  Intake --  Output 1000 ml  Net -1000 ml   Filed Weights   11/28/23 1609 12/05/23 1033  Weight: 57.2 kg 57.2 kg    Examination:  General exam: Appears calm and comfortable  Respiratory system: Clear to auscultation. Respiratory effort normal. Cardiovascular system: S1 & S2 heard, RRR. No JVD, murmurs, rubs, gallops or clicks. No pedal edema. Gastrointestinal system: Abdomen is nondistended, soft and nontender. No organomegaly or masses felt. Normal bowel sounds heard. Central nervous system: Alert and oriented.  Slight weakness in left upper extremity, slightly worse than her baseline, according to her.  This is improving though. Extremities: Symmetric 5 x 5 power. Skin: No rashes, lesions or ulcers Psychiatry: Judgement and insight appear normal. Mood & affect appropriate.    Data Reviewed: I have personally reviewed following labs and imaging studies  CBC: Recent Labs  Lab 12/03/23 0312 12/05/23 1854  WBC 12.8* 10.9*  NEUTROABS  --  9.1*  HGB 10.7* 11.9*  HCT 31.1* 35.5*  MCV 116.0* 119.5*  PLT 545* 691*   Basic Metabolic Panel: Recent Labs  Lab 12/03/23 0312 12/05/23 1854  NA 135 134*  K 3.8 4.0  CL 103 98  CO2 22 21*  GLUCOSE 94 146*  BUN 16 13  CREATININE 0.77 0.81  CALCIUM  8.9 9.2  MG  --  2.0  PHOS  --  3.8   GFR: Estimated Creatinine Clearance: 50 mL/min (by C-G formula based on SCr of 0.81 mg/dL). Liver Function Tests: No results for input(s): AST, ALT,  ALKPHOS, BILITOT, PROT, ALBUMIN in the last 168 hours. No results for input(s): LIPASE, AMYLASE in the last 168 hours. No results for input(s): AMMONIA in the last 168 hours. Coagulation Profile: No results for input(s): INR, PROTIME in the last 168 hours. Cardiac Enzymes: No results for input(s): CKTOTAL, CKMB, CKMBINDEX, TROPONINI in the last 168 hours. BNP (last 3 results) No results for input(s): PROBNP in the last 8760 hours. HbA1C: Recent Labs    12/05/23 1854  HGBA1C 5.1   CBG: Recent Labs  Lab 12/05/23 1643  GLUCAP 147*   Lipid Profile: Recent Labs    12/06/23 0547  CHOL 173  HDL 39*  LDLCALC 122*  TRIG 62  CHOLHDL 4.4   Thyroid  Function Tests: No results for input(s): TSH, T4TOTAL, FREET4, T3FREE, THYROIDAB in the last 72 hours. Anemia Panel: No results for input(s): VITAMINB12, FOLATE, FERRITIN, TIBC, IRON, RETICCTPCT in the last 72 hours. Sepsis Labs: No results for input(s): PROCALCITON, LATICACIDVEN in the last 168 hours.  Recent Results (from the  past 240 hours)  Resp panel by RT-PCR (RSV, Flu A&B, Covid) Anterior Nasal Swab     Status: None   Collection Time: 11/28/23  4:48 PM   Specimen: Anterior Nasal Swab  Result Value Ref Range Status   SARS Coronavirus 2 by RT PCR NEGATIVE NEGATIVE Final    Comment: (NOTE) SARS-CoV-2 target nucleic acids are NOT DETECTED.  The SARS-CoV-2 RNA is generally detectable in upper respiratory specimens during the acute phase of infection. The lowest concentration of SARS-CoV-2 viral copies this assay can detect is 138 copies/mL. A negative result does not preclude SARS-Cov-2 infection and should not be used as the sole basis for treatment or other patient management decisions. A negative result may occur with  improper specimen collection/handling, submission of specimen other than nasopharyngeal swab, presence of viral mutation(s) within the areas targeted by  this assay, and inadequate number of viral copies(<138 copies/mL). A negative result must be combined with clinical observations, patient history, and epidemiological information. The expected result is Negative.  Fact Sheet for Patients:  BloggerCourse.com  Fact Sheet for Healthcare Providers:  SeriousBroker.it  This test is no t yet approved or cleared by the [place]  FDA and  has been authorized for detection and/or diagnosis of SARS-CoV-2 by FDA under an Emergency Use Authorization (EUA). This EUA will remain  in effect (meaning this test can be used) for the duration of the COVID-19 declaration under Section 564(b)(1) of the Act, 21 U.S.C.section 360bbb-3(b)(1), unless the authorization is terminated  or revoked sooner.       Influenza A by PCR NEGATIVE NEGATIVE Final   Influenza B by PCR NEGATIVE NEGATIVE Final    Comment: (NOTE) The Xpert Xpress SARS-CoV-2/FLU/RSV plus assay is intended as an aid in the diagnosis of influenza from Nasopharyngeal swab specimens and should not be used as a sole basis for treatment. Nasal washings and aspirates are unacceptable for Xpert Xpress SARS-CoV-2/FLU/RSV testing.  Fact Sheet for Patients: BloggerCourse.com  Fact Sheet for Healthcare Providers: SeriousBroker.it  This test is not yet approved or cleared by the [place]  FDA and has been authorized for detection and/or diagnosis of SARS-CoV-2 by FDA under an Emergency Use Authorization (EUA). This EUA will remain in effect (meaning this test can be used) for the duration of the COVID-19 declaration under Section 564(b)(1) of the Act, 21 U.S.C. section 360bbb-3(b)(1), unless the authorization is terminated or revoked.     Resp Syncytial Virus by PCR NEGATIVE NEGATIVE Final    Comment: (NOTE) Fact Sheet for Patients: BloggerCourse.com  Fact Sheet  for Healthcare Providers: SeriousBroker.it  This test is not yet approved or cleared by the [place]  FDA and has been authorized for detection and/or diagnosis of SARS-CoV-2 by FDA under an Emergency Use Authorization (EUA). This EUA will remain in effect (meaning this test can be used) for the duration of the COVID-19 declaration under Section 564(b)(1) of the Act, 21 U.S.C. section 360bbb-3(b)(1), unless the authorization is terminated or revoked.  Performed at [facility], [address], [place], [place] [address]   Surgical pcr screen     Status: None   Collection Time: 12/05/23  7:59 AM   Specimen: Nasal Mucosa; Nasal Swab  Result Value Ref Range Status   MRSA, PCR NEGATIVE NEGATIVE Final   Staphylococcus aureus NEGATIVE NEGATIVE Final    Comment: (NOTE) The Xpert SA Assay (FDA approved for NASAL specimens in patients 34 years of age and older), is one component of a comprehensive surveillance program. It is not  intended to diagnose infection nor to guide or monitor treatment. Performed at [facility], [address], [place], [place] [address]      Radiology Studies: CT HEAD CODE STROKE WO CONTRAST Addendum Date: 12/05/2023 ADDENDUM REPORT: 12/05/2023 17:39 ADDENDUM: These results were called by telephone at the time of interpretation on 12/05/2023 at 5:20 pm to provider Dr. Michaela, who verbally acknowledged these results. Electronically Signed   By: Rockey Childs D.O.   On: 12/05/2023 17:39   Result Date: 12/05/2023 CLINICAL DATA:  Code stroke. EXAM: CT HEAD WITHOUT CONTRAST TECHNIQUE: Contiguous axial images were obtained from the base of the skull through the vertex without intravenous contrast. RADIATION DOSE REDUCTION: This exam was performed according to the departmental dose-optimization program which includes automated exposure control, adjustment of the mA and/or kV according to patient size and/or use of iterative  reconstruction technique. COMPARISON:  Brain MRI 11/28/2023.  Cervical spine CT 11/29/2023. FINDINGS: Brain: Generalized cerebral atrophy. Fairly extensive pneumocephalus (greatest along the anterior frontal lobes and within the posterior fossa), likely related to the cervical spine surgery performed earlier today. Correlate with the operative history. Patchy and ill-defined hypoattenuation within the cerebral white matter, nonspecific but compatible with moderate-to-advanced chronic small vessel ischemic disease. There is no acute intracranial hemorrhage. No demarcated cortical infarct. No extra-axial fluid collection. No evidence of an intracranial mass. No midline shift. Vascular: No hyperdense vessel.  Atherosclerotic calcifications. Skull: No calvarial fracture or aggressive osseous lesion. Sinuses/Orbits: No mass or acute finding within the imaged orbits. 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 Of midline. No significant paranasal sinus disease at the imaged levels. Other: 15 mm forehead lipoma just to the left ASPECTS [facility] Stroke Program Early CT Score) - Ganglionic level infarction (caudate, lentiform nuclei, internal capsule, insula, M1-M3 cortex): 7 - Supraganglionic infarction (M4-M6 cortex): 3 Total score (0-10 with 10 being normal): 10 Attempts are being made to reach the ordering provider at this site. IMPRESSION: 1. No acute intracranial hemorrhage or evidence of an acute infarct. 2. Extensive pneumocephalus (greatest along the anterior frontal lobes and within the posterior fossa), likely related to the cervical spine surgery performed earlier today. Correlate with the operative history. 3. Parenchymal atrophy and chronic small vessel ischemic disease. Electronically Signed: By: Rockey Childs D.O. On: 12/05/2023 17:19   CT ANGIO HEAD NECK W WO CM (CODE STROKE) Result Date: 12/05/2023 CLINICAL DATA:  Provided history: Neuro deficit, acute, stroke suspected. Left-sided weakness. EXAM: CT ANGIOGRAPHY HEAD  AND NECK WITH AND WITHOUT CONTRAST TECHNIQUE: Multidetector CT imaging of the head and neck was performed using the standard protocol during bolus administration of intravenous contrast. Multiplanar CT image reconstructions and MIPs were obtained to evaluate the vascular anatomy. Carotid stenosis measurements (when applicable) are obtained utilizing NASCET criteria, using the distal internal carotid diameter as the denominator. RADIATION DOSE REDUCTION: This exam was performed according to the departmental dose-optimization program which includes automated exposure control, adjustment of the mA and/or kV according to patient size and/or use of iterative reconstruction technique. CONTRAST:  75mL OMNIPAQUE  IOHEXOL  350 MG/ML SOLN COMPARISON:  Noncontrast head CT performed earlier today 12/05/2023. Cervical spine CT 11/29/2023. Cervical spine MRI 11/28/2023. FINDINGS: CTA NECK FINDINGS Aortic arch: Standard aortic branching. Atherosclerotic plaque within the aortic arch at the visible levels and within the proximal major branch vessels of the neck. No hemodynamically significant innominate or proximal subclavian artery stenosis. Right carotid system: CCA and ICA patent within the neck. Fairly prominent (predominant calcified) atherosclerotic plaque at the carotid bifurcation  and within the proximal ICA. Estimated 50% stenosis at the ICA origin. Left carotid system: CCA and ICA patent within the neck. Fairly prominent (predominantly calcified) atherosclerotic. Estimated fifty-60% stenosis at the ICA origin. Vertebral arteries: Patent within the neck. The right vertebral artery is dominant. Moderate stenosis at the right vertebral artery origin. Severe stenosis at the left vertebral artery origin. Skeleton: Interval C1 posterior decompression. Correlate with the operative history. Spondylosis at the cervical and visible upper thoracic levels. Other neck: Subcutaneous gas within the neck posteriorly related to surgery  performed earlier today. Additionally, there is fairly extensive gas within the spinal canal at the cervical and visible upper thoracic levels, also likely postoperative. Upper chest: No consolidation within the imaged lung apices. Biapical pleuroparenchymal scarring. Review of the MIP images confirms the above findings CTA HEAD FINDINGS Anterior circulation: The intracranial internal carotid arteries are patent. Atherosclerotic plaque within both vessels with no more than mild stenosis. The M1 middle cerebral arteries are patent. Atherosclerotic irregularity of the M2 and more distal MCA branches bilaterally. Most notably, there is a moderate-to-severe stenosis within a mid M2 left MCA branch (series 12, image 24). The anterior cerebral arteries are patent. No intracranial aneurysm is identified. Posterior circulation: The intracranial vertebral arteries are patent. Moderate stenosis within the left vertebral at the V3/V4 junction. The basilar artery is patent. The posterior cerebral arteries are patent. Atherosclerotic irregularity of both vessels without high-grade proximal stenosis. Posterior communicating arteries are diminutive or absent, bilateral. Venous sinuses: Poor assessment for dural venous sinus thrombosis due to contrast timing. Anatomic variants: As described. Review of the MIP images confirms the above findings No emergent large vessel occlusion identified. These results were called by telephone at the time of interpretation on 12/05/2023 at 5:20 pm to provider Dr. Michaela, who verbally acknowledged these results. IMPRESSION: CTA neck: 1. The common carotid and internal carotid arteries are patent within the neck. Fairly prominent atherosclerotic plaque bilaterally, as described. Estimated 50% stenosis at the right ICA origin. Estimated 50-60% stenosis at the left ICA origin. 2. Vertebral arteries patent within the neck. Moderate stenosis at the right vertebral artery origin. Severe stenosis at  the left vertebral artery origin. 3. Interval C1 posterior decompression. Postoperative subcutaneous gas within the posterior neck. Extensive gas within the spinal canal at the cervical and visible thoracic levels, also presumably postoperative. Correlate with the operative history. 4. Aortic Atherosclerosis (ICD10-I70.0). CTA head: 1. No proximal intracranial large vessel occlusion identified. 2. Intracranial atherosclerotic disease with multifocal stenoses, most notably as follows. 3. Moderate-to-severe stenosis within a mid M2 left middle cerebral artery branch. 4. Moderate stenosis within the left vertebral artery at the V3/V4 junction. Electronically Signed   By: Rockey Childs D.O.   On: 12/05/2023 17:38   DG Cervical Spine 1 View Result Date: 12/05/2023 CLINICAL DATA:  Posterior cervical decompression EXAM: DG CERVICAL SPINE - 1 VIEW COMPARISON:  CT cervical spine dated 11/29/2023 FINDINGS: One fluoroscopic image obtained during posterior cervical decompression. 2 seconds fluoro time utilized. Radiation dose 0.12 mGy Kerma. Please see performing physicians operative report for full details. IMPRESSION: Fluoroscopic images were obtained for intraoperative guidance of posterior cervical decompression. Electronically Signed   By: Limin  Xu M.D.   On: 12/05/2023 17:22   DG C-Arm 1-60 Min-No Report Result Date: 12/05/2023 Fluoroscopy was utilized by the requesting physician.  No radiographic interpretation.    Scheduled Meds:  atorvastatin   40 mg Oral Daily   brimonidine   1 drop Right Eye QHS   Chlorhexidine  Gluconate  Cloth  6 each Topical Daily   docusate sodium   100 mg Oral BID   folic acid   1 mg Oral Daily   hydroxyurea   500 mg Oral QID   levothyroxine   50 mcg Oral QODAY   levothyroxine   75 mcg Oral QODAY   loratadine   10 mg Oral Daily   senna  1 tablet Oral BID   sodium chloride  flush  3 mL Intravenous Q12H   Continuous Infusions:   LOS: 9 days   Fredia Skeeter, MD [facility]  12/07/2023, 10:19 AM   *Please note that this is a verbal dictation therefore any spelling or grammatical errors are due to the Dragon Medical One system interpretation.  Please page via Amion and do not message via secure chat for urgent patient care matters. Secure chat can be used for non urgent patient care matters.  How to contact the [facility] Attending or Consulting provider 7A - 7P or covering provider during after hours 7P -7A, for this patient?  Check the care team in [facility] and look for a) attending/consulting [facility] provider listed and b) the [facility] team listed. Page or secure chat 7A-7P. Log into [email or web address] and use [facility] universal password to access. If you do not have the password, please contact the hospital operator. Locate the [facility] provider you are looking for under [facility] and page to a number that you can be directly reached. If you still have difficulty reaching the provider, please page the [facility] (Director on Call) for the Hospitalists listed on amion for assistance.

## 2023-12-07 NOTE — Evaluation (Addendum)
 Speech Language Pathology Evaluation Patient Details Name: Gabrielle Donovan MRN: [id number] DOB: 12-15-1945 Today's Date: 12/07/2023 Time: 1005-1020 SLP Time Calculation (min) (ACUTE ONLY): 15 min  Problem List:  Patient Active Problem List   Diagnosis Date Noted   Hypothyroidism 11/29/2023   Cervical myelopathy (HCC) 11/28/2023   Splenic abscess 10/09/2022   Sepsis (HCC) 10/09/2022   Hypokalemia 10/09/2022   Acute hyponatremia 10/09/2022   Polycythemia vera (HCC) 10/09/2022   Essential hypertension 10/09/2022   Acquired hypothyroidism 10/09/2022   Spondylosis, cervical, with myelopathy 01/18/2019   Expected blood loss anemia 08/01/2013   Overweight (BMI 25.0-29.9) 08/01/2013   S/P right THA, 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   Past Medical History:  Past Medical History:  Diagnosis Date   Anemia    Arthritis    Cervical myelopathy (HCC)    Diverticulitis    Full dentures    GI bleed    Glaucoma    right eye   Headache    History of blood transfusion    Hypertension    Lumbago with sciatica, right side    treated with ESI   Polycythemia    Thrombocytosis    Wears glasses    Past Surgical History:  Past Surgical History:  Procedure Laterality Date   ABDOMINAL HYSTERECTOMY     CATARACT EXTRACTION, BILATERAL     FRACTURE SURGERY     right wrist   POSTERIOR CERVICAL FUSION/FORAMINOTOMY N/A 01/18/2019   Procedure: Posterior decompression ring of Cervical one;  Surgeon: Colon Shove, MD;  Location: [facility] OR;  Service: Neurosurgery;  Laterality: N/A;   POSTERIOR CERVICAL FUSION/FORAMINOTOMY N/A 12/05/2023   Procedure: POSTERIOR SUBOCCIPITAL AND CERVICAL DECOMPRESSION CERVICAL ONE-CERVICAL TWO WITH RESECTION OF PANNUS;  Surgeon: Colon Shove, MD;  Location: [facility] OR;  Service: Neurosurgery;  Laterality: N/A;   POSTERIOR CERVICAL LAMINECTOMY N/A 01/18/2019   Procedure: POSTERIOR CERVICAL WOUND EXPLORATION;  Surgeon: Colon Shove, MD;  Location: [facility] OR;  Service: Neurosurgery;  Laterality: N/A;    TOTAL HIP ARTHROPLASTY Right 07/31/2013   Procedure: RIGHT TOTAL HIP ARTHROPLASTY ANTERIOR APPROACH;  Surgeon: Donnice JONETTA Car, MD;  Location: [facility] ORS;  Service: Orthopedics;  Laterality: Right;   TUBAL LIGATION     HPI:  Gabrielle Donovan is an 78 yo female who presented 11/28/23 with progressive L sided weakness. Imaging revealed C1 cervical spinal stenosis, S/p steere decompression occiput to C2 with resection of synovial cyst at odontoid using transdural approach on 9/22. Code stroke called in PACU . CT head negative, CTA negative for LVO. MRI pending. PMHx:  OA, essential HTN, right sided low back pain, and polycythemia   Assessment / Plan / Recommendation Clinical Impression  Pt's MRI was negative with dysarthria noted at time of code stroke. Pt denies changes in speech-language-cognition. She lives with her nieces and states she pays her bills online and uses a pill  box for medications. Her speech is intelligible without dysarthria noted. Language is intact for conversation, naming and followed 3 step command. She performed within normal limits on all cognitive tasks except memory. Unable to recall 4 words with difficulty in storage and retrieval. MD arrived and at end of session she recalled accurately information relayed by MD. Pt states she uses a calendar at home to recall doctor/important appointments. Pt does not need follow up while in acute care and disposition is to SNF. Once home recommend she continue to use writing to recall strategies and if needed nieces can supervise with initial medication management and finances. Asked about swallow after posterior  cervical dusion and pt stated she was not having swallow difficulty.     SLP Assessment  SLP Recommendation/Assessment: Patient does not need any further Speech Language Pathology Services SLP Visit Diagnosis: Cognitive communication deficit (R41.841)     Assistance Recommended at Discharge  Set up Supervision/Assistance  Functional  Status Assessment Patient has not had a recent decline in their functional status  Frequency and Duration           SLP Evaluation Cognition  Overall Cognitive Status: No family/caregiver present to determine baseline cognitive functioning (decreased 4 word recall- see impression statement) Arousal/Alertness: Awake/alert Orientation Level: Oriented X4 Year: 2025 Month: September Day of Week: Correct Attention: Sustained Sustained Attention: Appears intact Memory: Impaired Memory Impairment: Storage deficit;Retrieval deficit Awareness: Appears intact Problem Solving: Appears intact Safety/Judgment: Appears intact       Comprehension  Auditory Comprehension Overall Auditory Comprehension: Appears within functional limits for tasks assessed Commands: Within Functional Limits (followed 3 step command) Conversation: Simple Visual Recognition/Discrimination Discrimination: Not tested Reading Comprehension Reading Status: Not tested    Expression Expression Primary Mode of Expression: Verbal Verbal Expression Overall Verbal Expression: Appears within functional limits for tasks assessed Initiation: No impairment Level of Generative/Spontaneous Verbalization: Conversation Repetition:  (NT) Naming: No impairment Pragmatics: No impairment Written Expression Written Expression: Not tested   Oral / Motor  Motor Speech Overall Motor Speech: Appears within functional limits for tasks assessed Respiration: Within functional limits Phonation: Normal Resonance: Within functional limits Articulation: Within functional limitis Intelligibility: Intelligible Motor Planning: Within functional limits Motor Speech Errors: Not applicable            Gabrielle Donovan 12/07/2023, 10:41 AM

## 2023-12-07 NOTE — Discharge Summary (Signed)
 Physician Discharge Summary  Gabrielle Donovan FMW:[id number] DOB: [birth year] DOA: 11/28/2023  PCP: Lari Elspeth BRAVO, MD  Admit date: 11/28/2023 Discharge date: 12/07/2023 30 Day Unplanned Readmission Risk Score    Flowsheet Row ED to Hosp-Admission (Current) from 11/28/2023 in [facility]  30 Day Unplanned Readmission Risk Score (%) 14.34 Filed at 12/07/2023 1200    This score is the patient's risk of an unplanned readmission within 30 days of being discharged (0 -100%). The score is based on dignosis, age, lab data, medications, orders, and past utilization.   Low:  0-14.9   Medium: 15-21.9   High: 22-29.9   Extreme: 30 and above          Admitted From: Home Disposition: SNF  Recommendations for Outpatient Follow-up:  Follow up with PCP in 1-2 weeks Please obtain BMP/CBC in one week Follow-up with Dr. Elsner/neurosurgery in 2 weeks Please follow up with your PCP on the following pending results: Unresulted Labs (From admission, onward)     Start     Ordered   12/07/23 1016  CBC with Differential/Platelet  ONCE - URGENT,   URGENT       Question:  Specimen collection method  Answer:  Lab=Lab collect   12/07/23 1015              Home Health: None Equipment/Devices: None  Discharge Condition: Stable CODE STATUS: DNR Diet recommendation:  Diet Order             Diet regular Room service appropriate? Yes; Fluid consistency: Thin  Diet effective now           Diet - low sodium heart healthy                   Subjective: In and examined, no complaints at all.  She is in agreement with going to SNF.  Brief/Interim Summary: 78 y.o. female with hx of HTN, polycythemia, degenerative spine disease who presented 9/15 with progressive left-sided weakness.  Imaging revealed C1 cervical spinal stenosis, s/p occiput decompression to C2 with resection of synovial cyst. Post operatively she had L sided weakness for which a code stroke was called. CT H neg for  acute hemorrhage, CTA neg for LVO. Not a candidate for tnk given spine surgery same day.  She was transferred to ICU in this setting and PCCM consulted.  Eventually transferred under [facility] on 12/07/2023.   Significant Hospital Events: Including procedures, antibiotic start and stop dates in addition to other pertinent events   9/15 admit for extremity wknss, C1-C2 bulky pannus causing cord compression 9/16 NSGY consult 9/22 OR for posterior suboccipital cervical decompression C1-C2, with pannus decompression. Code stroke post op for LUE weakness  9/23: stable from yesterday, transfer out of ICU  9/24, transferred under [facility].     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  Followed by neurosurgery, no further intervention planned.  Seen by PT OT, they recommended SNF.  TOC following.  Patient is being discharged to SNF today.  Discussed with Dr. Colon, he cleared the patient to resume prior dose of aspirin  81 mg p.o. daily.   L Sided weakness with facial droop and dysarthria, likely due to acute stroke Patient presented with left-sided weakness prior to surgery. Postsurgery she was noted to have worsening  of left-sided weakness compared to right with left facial droop and some dysarthria.  Code stroke was called. Stat head CT was done which showed no acute abnormalities except extensive pneumocephalus. CTA is negative for LVO.  She was not a candidate for TNK or thrombectomy due to having the surgery same day.MRI brain could not be completed and since her symptoms are improving, neurology canceled MRI brain.  Neurology signed off.  Resuming aspirin .   HTN Blood pressure slowly rising, resume home dose of Coreg  3.125 mg p.o. twice daily.   Hypothyroidism  - con't synthroid      Polycythemia vera Continue hydroxyurea    Hyperlipidemia: LDL 122.  Started on atorvastatin  40 mg.  Discharge plan was discussed with patient and/or family member and they verbalized understanding and agreed with it.  Discharge Diagnoses:  Principal Problem:   Cervical myelopathy (HCC) Active Problems:   Polycythemia vera (HCC)   Essential hypertension   Hypothyroidism    Discharge Instructions  Discharge Instructions     Diet - low sodium heart healthy   Complete by: As directed    Discharge instructions   Complete by: As directed    You were admitted for weakness  Here, we found that you have cervical stenosis at the C1 level  You should have surgery for this  Call Dr. Thayer office to confirm the timing of surgery  For pain, you may take oxycodone  or acetaminophen  (or both)  Continue all your home medicines, except aspirin , hold aspirin  until after surgery  You SHOULD be taking both Carvedilol  and chlorthalidone  daily however   Incentive spirometry RT   Complete by: As directed    Increase activity slowly   Complete by: As directed       Allergies as of 12/07/2023       Reactions   Codeine Nausea And Vomiting   Hydrocodone  Nausea And Vomiting        Medication List     TAKE these medications    aspirin  EC 81 MG tablet Take 81 mg by mouth daily. Swallow whole.   atorvastatin  40 MG tablet Commonly known as: LIPITOR Take 1 tablet (40 mg total) by mouth daily.   brimonidine  0.2 % ophthalmic solution Commonly known as: ALPHAGAN  Place 1 drop into the right eye at bedtime.   carvedilol  3.125 MG tablet Commonly known as: COREG  Take 1 tablet (3.125 mg total) by mouth 2 (two) times daily with a meal.   cetirizine 10 MG tablet Commonly known as: ZYRTEC Take 10 mg by mouth daily.   chlorthalidone  25 MG tablet Commonly known as: HYGROTON  Take 1 tablet (25 mg total) by mouth daily as needed (for high BP).   folic acid  1 MG tablet Commonly  known as: FOLVITE  Take 1 mg by mouth daily.   hydroxyurea  500 MG capsule Commonly known as: HYDREA  Take 500 mg by mouth in the morning, at noon, in the evening, and at bedtime.   latanoprost  0.005 % ophthalmic solution Commonly known as: XALATAN  Place 1 drop into the right eye at bedtime.   oxyCODONE  5 MG immediate release tablet Commonly known as: Oxy IR/ROXICODONE  Take 1 tablet (5 mg total) by mouth every 6 (six) hours as needed for severe pain (pain score 7-10).   Synthroid  75 MCG tablet Generic drug: levothyroxine  Take 75 mcg by mouth every other day. Alternating with 5mcg   Synthroid  50 MCG tablet Generic drug: levothyroxine  Take 50 mcg by mouth every other day. Alternating with 75mcg  Contact information for follow-up providers     Burdine, Elspeth BRAVO, MD Follow up.   Specialty: Family Medicine Contact information: [address] [place] [address] [phone]         Colon Shove, MD Follow up.   Specialty: Neurosurgery Contact information: [address] [place] [address] [phone]         Lari Elspeth BRAVO, MD Follow up in 1 week(s).   Specialty: Family Medicine Contact information: [address] [place] [address] [phone]              Contact information for after-discharge care     Destination     [facility] .   Service: Skilled Nursing Contact information: [address] [place]  [address] [phone]                    Allergies  Allergen Reactions   Codeine Nausea And Vomiting   Hydrocodone  Nausea And Vomiting    Consultations: Neurology and neurosurgery as well as critical care   Procedures/Studies: CT HEAD CODE STROKE WO CONTRAST Addendum Date: 12/05/2023 ADDENDUM REPORT: 12/05/2023 17:39 ADDENDUM: These results were called by telephone at the time of interpretation on 12/05/2023 at 5:20 pm to provider Dr. Michaela, who verbally acknowledged these  results. Electronically Signed   By: Rockey Childs D.O.   On: 12/05/2023 17:39   Result Date: 12/05/2023 CLINICAL DATA:  Code stroke. EXAM: CT HEAD WITHOUT CONTRAST TECHNIQUE: Contiguous axial images were obtained from the base of the skull through the vertex without intravenous contrast. RADIATION DOSE REDUCTION: This exam was performed according to the departmental dose-optimization program which includes automated exposure control, adjustment of the mA and/or kV according to patient size and/or use of iterative reconstruction technique. COMPARISON:  Brain MRI 11/28/2023.  Cervical spine CT 11/29/2023. FINDINGS: Brain: Generalized cerebral atrophy. Fairly extensive pneumocephalus (greatest along the anterior frontal lobes and within the posterior fossa), likely related to the cervical spine surgery performed earlier today. Correlate with the operative history. Patchy and ill-defined hypoattenuation within the cerebral white matter, nonspecific but compatible with moderate-to-advanced chronic small vessel ischemic disease. There is no acute intracranial hemorrhage. No demarcated cortical infarct. No extra-axial fluid collection. No evidence of an intracranial mass. No midline shift. Vascular: No hyperdense vessel.  Atherosclerotic calcifications. Skull: No calvarial fracture or aggressive osseous lesion. Sinuses/Orbits: No mass or acute finding within the imaged orbits. 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 Of midline. No significant paranasal sinus disease at the imaged levels. Other: 15 mm forehead lipoma just to the left ASPECTS [facility] Stroke Program Early CT Score) - Ganglionic level infarction (caudate, lentiform nuclei, internal capsule, insula, M1-M3 cortex): 7 - Supraganglionic infarction (M4-M6 cortex): 3 Total score (0-10 with 10 being normal): 10 Attempts are being made to reach the ordering provider at this site. IMPRESSION: 1. No acute intracranial hemorrhage or evidence of an acute infarct. 2. Extensive  pneumocephalus (greatest along the anterior frontal lobes and within the posterior fossa), likely related to the cervical spine surgery performed earlier today. Correlate with the operative history. 3. Parenchymal atrophy and chronic small vessel ischemic disease. Electronically Signed: By: Rockey Childs D.O. On: 12/05/2023 17:19   CT ANGIO HEAD NECK W WO CM (CODE STROKE) Result Date: 12/05/2023 CLINICAL DATA:  Provided history: Neuro deficit, acute, stroke suspected. Left-sided weakness. EXAM: CT ANGIOGRAPHY HEAD AND NECK WITH AND WITHOUT CONTRAST TECHNIQUE: Multidetector CT imaging of the head and neck was performed using the  standard protocol during bolus administration of intravenous contrast. Multiplanar CT image reconstructions and MIPs were obtained to evaluate the vascular anatomy. Carotid stenosis measurements (when applicable) are obtained utilizing NASCET criteria, using the distal internal carotid diameter as the denominator. RADIATION DOSE REDUCTION: This exam was performed according to the departmental dose-optimization program which includes automated exposure control, adjustment of the mA and/or kV according to patient size and/or use of iterative reconstruction technique. CONTRAST:  75mL OMNIPAQUE  IOHEXOL  350 MG/ML SOLN COMPARISON:  Noncontrast head CT performed earlier today 12/05/2023. Cervical spine CT 11/29/2023. Cervical spine MRI 11/28/2023. FINDINGS: CTA NECK FINDINGS Aortic arch: Standard aortic branching. Atherosclerotic plaque within the aortic arch at the visible levels and within the proximal major branch vessels of the neck. No hemodynamically significant innominate or proximal subclavian artery stenosis. Right carotid system: CCA and ICA patent within the neck. Fairly prominent (predominant calcified) atherosclerotic plaque at the carotid bifurcation and within the proximal ICA. Estimated 50% stenosis at the ICA origin. Left carotid system: CCA and ICA patent within the neck. Fairly  prominent (predominantly calcified) atherosclerotic. Estimated fifty-60% stenosis at the ICA origin. Vertebral arteries: Patent within the neck. The right vertebral artery is dominant. Moderate stenosis at the right vertebral artery origin. Severe stenosis at the left vertebral artery origin. Skeleton: Interval C1 posterior decompression. Correlate with the operative history. Spondylosis at the cervical and visible upper thoracic levels. Other neck: Subcutaneous gas within the neck posteriorly related to surgery performed earlier today. Additionally, there is fairly extensive gas within the spinal canal at the cervical and visible upper thoracic levels, also likely postoperative. Upper chest: No consolidation within the imaged lung apices. Biapical pleuroparenchymal scarring. Review of the MIP images confirms the above findings CTA HEAD FINDINGS Anterior circulation: The intracranial internal carotid arteries are patent. Atherosclerotic plaque within both vessels with no more than mild stenosis. The M1 middle cerebral arteries are patent. Atherosclerotic irregularity of the M2 and more distal MCA branches bilaterally. Most notably, there is a moderate-to-severe stenosis within a mid M2 left MCA branch (series 12, image 24). The anterior cerebral arteries are patent. No intracranial aneurysm is identified. Posterior circulation: The intracranial vertebral arteries are patent. Moderate stenosis within the left vertebral at the V3/V4 junction. The basilar artery is patent. The posterior cerebral arteries are patent. Atherosclerotic irregularity of both vessels without high-grade proximal stenosis. Posterior communicating arteries are diminutive or absent, bilateral. Venous sinuses: Poor assessment for dural venous sinus thrombosis due to contrast timing. Anatomic variants: As described. Review of the MIP images confirms the above findings No emergent large vessel occlusion identified. These results were called by  telephone at the time of interpretation on 12/05/2023 at 5:20 pm to provider Dr. Michaela, who verbally acknowledged these results. IMPRESSION: CTA neck: 1. The common carotid and internal carotid arteries are patent within the neck. Fairly prominent atherosclerotic plaque bilaterally, as described. Estimated 50% stenosis at the right ICA origin. Estimated 50-60% stenosis at the left ICA origin. 2. Vertebral arteries patent within the neck. Moderate stenosis at the right vertebral artery origin. Severe stenosis at the left vertebral artery origin. 3. Interval C1 posterior decompression. Postoperative subcutaneous gas within the posterior neck. Extensive gas within the spinal canal at the cervical and visible thoracic levels, also presumably postoperative. Correlate with the operative history. 4. Aortic Atherosclerosis (ICD10-I70.0). CTA head: 1. No proximal intracranial large vessel occlusion identified. 2. Intracranial atherosclerotic disease with multifocal stenoses, most notably as follows. 3. Moderate-to-severe stenosis within a mid M2 left middle cerebral artery  branch. 4. Moderate stenosis within the left vertebral artery at the V3/V4 junction. Electronically Signed   By: Rockey Childs D.O.   On: 12/05/2023 17:38   DG Cervical Spine 1 View Result Date: 12/05/2023 CLINICAL DATA:  Posterior cervical decompression EXAM: DG CERVICAL SPINE - 1 VIEW COMPARISON:  CT cervical spine dated 11/29/2023 FINDINGS: One fluoroscopic image obtained during posterior cervical decompression. 2 seconds fluoro time utilized. Radiation dose 0.12 mGy Kerma. Please see performing physicians operative report for full details. IMPRESSION: Fluoroscopic images were obtained for intraoperative guidance of posterior cervical decompression. Electronically Signed   By: Limin  Xu M.D.   On: 12/05/2023 17:22   DG C-Arm 1-60 Min-No Report Result Date: 12/05/2023 Fluoroscopy was utilized by the requesting physician.  No radiographic  interpretation.   CT CERVICAL SPINE WO CONTRAST Result Date: 11/30/2023 EXAM: CT CERVICAL SPINE WITHOUT CONTRAST 11/29/2023 07:25:00 PM TECHNIQUE: CT of the cervical spine was performed without the administration of intravenous contrast. Multiplanar reformatted images are provided for review. Automated exposure control, iterative reconstruction, and/or weight based adjustment of the mA/kV was utilized to reduce the radiation dose to as low as reasonably achievable. COMPARISON: CT of the cervical spine dated 01/18/2019 and MRI of the cervical spine dated 11/28/2023. CLINICAL HISTORY: Bone lesion, cervical spine, incidental; Attention craniocervical junction. FINDINGS: CERVICAL SPINE: BONES AND ALIGNMENT: There is reversal of the normal cervical lordosis. DEGENERATIVE CHANGES: C1-2: There is a prominent soft tissue pannus which is eccentric to the left resulting in severe left-sided spinal canal stenosis, compression, and right posterolateral displacement of the spinal cord at the craniocervical junction. C2: Benign-appearing cysts are present within the body of C2 on the left along the base of the dens, similar to the prior exams. C2-3: There is moderate left facet hypertrophy causing mild-to-moderate left neural foraminal stenosis. C3-4: There is disc space narrowing and mild uncovertebral joint hypertrophy with mild central spinal canal stenosis and mild bilateral neural foraminal stenosis. C4-5: The disc spaces are satisfactorily preserved and the spinal canal and neural foramina are patent. C5-6: There is disc space narrowing and bilateral endplate ridging with mild central spinal canal stenosis and bilateral neural foraminal stenosis. C6-7: There is disc space narrowing with mild right neural foraminal stenosis. SOFT TISSUES: No prevertebral soft tissue swelling. IMPRESSION: 1. Prominent soft tissue pannus at C1-2 resulting in severe left-sided spinal canal stenosis, compression, and right posterolateral  displacement of the spinal cord at the craniocervical junction. 2. Disc space narrowing and mild uncovertebral joint hypertrophy at C3-4 with mild central spinal canal stenosis and mild bilateral neural foraminal stenosis. 3. Disc space narrowing and bilateral endplate ridging at C5-6 with mild central spinal canal stenosis and bilateral neural foraminal stenosis. 4. Disc space narrowing at C6-7 with mild right neural foraminal stenosis. Electronically signed by: Evalene Coho MD 11/30/2023 04:34 AM EDT RP Workstation: [id number]   MR BRAIN WO CONTRAST Result Date: 11/28/2023 EXAM: MRI BRAIN WITHOUT CONTRAST 11/28/2023 07:40:36 PM TECHNIQUE: Multiplanar multisequence MRI of the head/brain was performed without the administration of intravenous contrast. COMPARISON: CT head 01/18/2019 and same day MRI cervical spine. CLINICAL HISTORY: LUE LLE weakness 1 month. FINDINGS: BRAIN AND VENTRICLES: No acute infarct. No intracranial hemorrhage. No mass. No midline shift. No hydrocephalus. The sella is unremarkable. Normal flow voids. T2/FLAIR hyperintensity in the periventricular and subcortical white matter with additional signal abnormality in the pons likely reflecting chronic microvascular ischemic changes. There is mild parenchymal volume loss. ORBITS: Bilateral lens replacement. SINUSES AND MASTOIDS: No acute abnormality. BONES  AND SOFT TISSUES: Normal marrow signal. No acute soft tissue abnormality. Left forehead subcutaneous lipoma. Partially visualized abnormal soft tissue along the dorsal aspect of the dens, left with midline likely reflecting degenerative pannus with significant spinal canal narrowing, which is better evaluated on the same day MRI of the cervical spine. IMPRESSION: 1. No acute findings. 2. Moderate for age chronic microvascular ischemic changes. 3. Mild parenchymal volume loss. Electronically signed by: Donnice Mania MD 11/28/2023 08:49 PM EDT RP Workstation: [id number]   MR Cervical Spine  Wo Contrast Result Date: 11/28/2023 CLINICAL DATA:  Myelopathy, acute, cervical spine EXAM: MRI CERVICAL SPINE WITHOUT CONTRAST TECHNIQUE: Multiplanar, multisequence MR imaging of the cervical spine was performed. No intravenous contrast was administered. COMPARISON:  None Available. FINDINGS: Alignment: No substantial sagittal subluxation. Vertebrae: No fracture, evidence of discitis, or bone lesion. Cord: Suspected mild T2 hyperintensity in the cord at the craniocervical junction. Posterior Fossa, vertebral arteries, paraspinal tissues: Negative. Disc levels: C1-C2: Bulky pannus along the posterior left paramidline dens which results in severe canal stenosis and cord compression. C2-C3: Left greater than right facet and vertebral hypertrophy with resulting severe left foraminal stenosis. Patent canal and right foramen. C3-C4: Bilateral facet uncovertebral hypertrophy results in moderate to severe bilateral foraminal stenosis. Patent canal. C4-C5: Bilateral facet and uncovertebral hypertrophy with mild right greater than left foraminal stenosis. Patent canal. C5-C6: Posterior disc osteophyte complex with left greater than right facet and vertebral or she. Resulting moderate left and mild right foraminal stenosis. Patent canal. C6-C7: Posterior disc osteophyte complex with right greater left facet hypertrophy L horseshoe. Resulting moderate right and mild left foraminal stenosis. Patent canal. C7-T1: Bilateral facet uncovertebral hypertrophy. Resulting mild to moderate bilateral foraminal stenosis. Mild canal stenosis. IMPRESSION: 1. At C1-C2, bulky pannus along the posterior left paramidline dens results in severe canal stenosis and cord compression. Suspected mild T2 hyperintensity in the cord at this level, compatible with edema and/or myelomalacia. 2. At C2-C3, severe left foraminal stenosis. 3. At C3-C4, moderate to severe bilateral foraminal stenosis. 4. At C5-C6, moderate left and mild right foraminal  stenosis. 5. At C6-C7, moderate right and mild left foraminal stenosis. 6. At C7-T1, mild to moderate bilateral foraminal stenosis and mild canal stenosis. Electronically Signed   By: Gilmore GORMAN Molt M.D.   On: 11/28/2023 20:32   CT Lumbar Spine Wo Contrast Result Date: 11/28/2023 CLINICAL DATA:  Myelopathy, acute, lumbar spine. EXAM: CT LUMBAR SPINE WITHOUT CONTRAST TECHNIQUE: Multidetector CT imaging of the lumbar spine was performed without intravenous contrast administration. Multiplanar CT image reconstructions were also generated. RADIATION DOSE REDUCTION: This exam was performed according to the departmental dose-optimization program which includes automated exposure control, adjustment of the mA and/or kV according to patient size and/or use of iterative reconstruction technique. COMPARISON:  CT abdomen/pelvis 10/29/2022. FINDINGS: Segmentation: 5 non rib-bearing lumbar vertebral bodies Alignment: Similar grade 1 anterolisthesis of L3 on L4 and L4 on L5. Vertebrae: Similar L1 chronic compression fracture. No evidence of acute fracture. Paraspinal and other soft tissues: Aorto bi-iliac calcific atherosclerosis. Disc levels: Probably at least moderate and potentially severe canal stenosis at L3-L4 and L4-L5. IMPRESSION: 1. Similar L1 chronic compression fracture. 2. No evidence of acute fracture. Similar grade 1 anterolisthesis of L3 on L4 and L4 on L5. 3. Probably at least moderate and potentially severe canal and subarticular recess stenosis at L3-L4 and L4-L5. An MRI could better assess the canal and foramina if clinically warranted. Electronically Signed   By: Gilmore GORMAN Molt M.D.   On: 11/28/2023  20:21   DG Chest Portable 1 View Result Date: 11/28/2023 CLINICAL DATA:  weak EXAM: PORTABLE CHEST - 1 VIEW COMPARISON:  10/28/2022 FINDINGS: No focal airspace consolidation, pleural effusion, or pneumothorax. Borderline cardiomegaly. Aortic atherosclerosis. Osteopenia. No acute fracture or destructive  lesions. Multilevel thoracic osteophytosis. IMPRESSION: No acute cardiopulmonary abnormality. Electronically Signed   By: Rogelia Myers M.D.   On: 11/28/2023 17:22     Discharge Exam: Vitals:   12/07/23 0800 12/07/23 1200  BP: (!) 148/75 (!) 141/83  Pulse: 84 89  Resp: 19 18  Temp: 98.4 F (36.9 C) 99.2 F (37.3 C)  SpO2: 98% 99%   Vitals:   12/06/23 2337 12/07/23 0404 12/07/23 0800 12/07/23 1200  BP: (!) 164/71 (!) 159/76 (!) 148/75 (!) 141/83  Pulse: 80 80 84 89  Resp: 18 18 19 18   Temp: 97.7 F (36.5 C) 98.5 F (36.9 C) 98.4 F (36.9 C) 99.2 F (37.3 C)  TempSrc: Oral Oral    SpO2: 98% 98% 98% 99%  Weight:      Height:        General: Pt is alert, awake, not in acute distress Cardiovascular: RRR, S1/S2 +, no rubs, no gallops Respiratory: CTA bilaterally, no wheezing, no rhonchi Abdominal: Soft, NT, ND, bowel sounds + Extremities: no edema, no cyanosis Central nervous system: Alert and oriented.  Slight weakness in left upper extremity, slightly worse than her baseline, according to her.  This is improving though.     The results of significant diagnostics from this hospitalization (including imaging, microbiology, ancillary and laboratory) are listed below for reference.     Microbiology: Recent Results (from the past 240 hours)  Resp panel by RT-PCR (RSV, Flu A&B, Covid) Anterior Nasal Swab     Status: None   Collection Time: 11/28/23  4:48 PM   Specimen: Anterior Nasal Swab  Result Value Ref Range Status   SARS Coronavirus 2 by RT PCR NEGATIVE NEGATIVE Final    Comment: (NOTE) SARS-CoV-2 target nucleic acids are NOT DETECTED.  The SARS-CoV-2 RNA is generally detectable in upper respiratory specimens during the acute phase of infection. The lowest concentration of SARS-CoV-2 viral copies this assay can detect is 138 copies/mL. A negative result does not preclude SARS-Cov-2 infection and should not be used as the sole basis for treatment or other patient  management decisions. A negative result may occur with  improper specimen collection/handling, submission of specimen other than nasopharyngeal swab, presence of viral mutation(s) within the areas targeted by this assay, and inadequate number of viral copies(<138 copies/mL). A negative result must be combined with clinical observations, patient history, and epidemiological information. The expected result is Negative.  Fact Sheet for Patients:  BloggerCourse.com  Fact Sheet for Healthcare Providers:  SeriousBroker.it  This test is no t yet approved or cleared by the [place]  FDA and  has been authorized for detection and/or diagnosis of SARS-CoV-2 by FDA under an Emergency Use Authorization (EUA). This EUA will remain  in effect (meaning this test can be used) for the duration of the COVID-19 declaration under Section 564(b)(1) of the Act, 21 U.S.C.section 360bbb-3(b)(1), unless the authorization is terminated  or revoked sooner.       Influenza A by PCR NEGATIVE NEGATIVE Final   Influenza B by PCR NEGATIVE NEGATIVE Final    Comment: (NOTE) The Xpert Xpress SARS-CoV-2/FLU/RSV plus assay is intended as an aid in the diagnosis of influenza from Nasopharyngeal swab specimens and should not be used as a sole basis for  treatment. Nasal washings and aspirates are unacceptable for Xpert Xpress SARS-CoV-2/FLU/RSV testing.  Fact Sheet for Patients: BloggerCourse.com  Fact Sheet for Healthcare Providers: SeriousBroker.it  This test is not yet approved or cleared by the [place]  FDA and has been authorized for detection and/or diagnosis of SARS-CoV-2 by FDA under an Emergency Use Authorization (EUA). This EUA will remain in effect (meaning this test can be used) for the duration of the COVID-19 declaration under Section 564(b)(1) of the Act, 21 U.S.C. section 360bbb-3(b)(1),  unless the authorization is terminated or revoked.     Resp Syncytial Virus by PCR NEGATIVE NEGATIVE Final    Comment: (NOTE) Fact Sheet for Patients: BloggerCourse.com  Fact Sheet for Healthcare Providers: SeriousBroker.it  This test is not yet approved or cleared by the [place]  FDA and has been authorized for detection and/or diagnosis of SARS-CoV-2 by FDA under an Emergency Use Authorization (EUA). This EUA will remain in effect (meaning this test can be used) for the duration of the COVID-19 declaration under Section 564(b)(1) of the Act, 21 U.S.C. section 360bbb-3(b)(1), unless the authorization is terminated or revoked.  Performed at [facility], [address], [place], [place] [address]   Surgical pcr screen     Status: None   Collection Time: 12/05/23  7:59 AM   Specimen: Nasal Mucosa; Nasal Swab  Result Value Ref Range Status   MRSA, PCR NEGATIVE NEGATIVE Final   Staphylococcus aureus NEGATIVE NEGATIVE Final    Comment: (NOTE) The Xpert SA Assay (FDA approved for NASAL specimens in patients 71 years of age and older), is one component of a comprehensive surveillance program. It is not intended to diagnose infection nor to guide or monitor treatment. Performed at [facility], [address], [place], [place] [address]      Labs: BNP (last 3 results) No results for input(s): BNP in the last 8760 hours. Basic Metabolic Panel: Recent Labs  Lab 12/03/23 0312 12/05/23 1854 12/07/23 1051  NA 135 134* 133*  K 3.8 4.0 3.9  CL 103 98 99  CO2 22 21* 24  GLUCOSE 94 146* 109*  BUN 16 13 12   CREATININE 0.77 0.81 0.61  CALCIUM  8.9 9.2 9.2  MG  --  2.0  --   PHOS  --  3.8  --    Liver Function Tests: No results for input(s): AST, ALT, ALKPHOS, BILITOT, PROT, ALBUMIN in the last 168 hours. No results for input(s): LIPASE, AMYLASE in the last 168 hours. No results for input(s):  AMMONIA in the last 168 hours. CBC: Recent Labs  Lab 12/03/23 0312 12/05/23 1854  WBC 12.8* 10.9*  NEUTROABS  --  9.1*  HGB 10.7* 11.9*  HCT 31.1* 35.5*  MCV 116.0* 119.5*  PLT 545* 691*   Cardiac Enzymes: No results for input(s): CKTOTAL, CKMB, CKMBINDEX, TROPONINI in the last 168 hours. BNP: Invalid input(s): POCBNP CBG: Recent Labs  Lab 12/05/23 1643  GLUCAP 147*   D-Dimer No results for input(s): DDIMER in the last 72 hours. Hgb A1c Recent Labs    12/05/23 1854  HGBA1C 5.1   Lipid Profile Recent Labs    12/06/23 0547  CHOL 173  HDL 39*  LDLCALC 122*  TRIG 62  CHOLHDL 4.4   Thyroid  function studies No results for input(s): TSH, T4TOTAL, T3FREE, THYROIDAB in the last 72 hours.  Invalid input(s): FREET3 Anemia work up No results for input(s): VITAMINB12, FOLATE, FERRITIN, TIBC, IRON, RETICCTPCT in the last 72 hours. Urinalysis    Component  Value Date/Time   COLORURINE YELLOW 11/28/2023 1611   APPEARANCEUR CLEAR 11/28/2023 1611   LABSPEC 1.016 11/28/2023 1611   PHURINE 5.0 11/28/2023 1611   GLUCOSEU NEGATIVE 11/28/2023 1611   HGBUR NEGATIVE 11/28/2023 1611   BILIRUBINUR NEGATIVE 11/28/2023 1611   KETONESUR NEGATIVE 11/28/2023 1611   PROTEINUR 30 (A) 11/28/2023 1611   UROBILINOGEN 0.2 07/27/2013 1242   NITRITE NEGATIVE 11/28/2023 1611   LEUKOCYTESUR NEGATIVE 11/28/2023 1611   Sepsis Labs Recent Labs  Lab 12/03/23 0312 12/05/23 1854  WBC 12.8* 10.9*   Microbiology Recent Results (from the past 240 hours)  Resp panel by RT-PCR (RSV, Flu A&B, Covid) Anterior Nasal Swab     Status: None   Collection Time: 11/28/23  4:48 PM   Specimen: Anterior Nasal Swab  Result Value Ref Range Status   SARS Coronavirus 2 by RT PCR NEGATIVE NEGATIVE Final    Comment: (NOTE) SARS-CoV-2 target nucleic acids are NOT DETECTED.  The SARS-CoV-2 RNA is generally detectable in upper respiratory specimens during the acute phase of  infection. The lowest concentration of SARS-CoV-2 viral copies this assay can detect is 138 copies/mL. A negative result does not preclude SARS-Cov-2 infection and should not be used as the sole basis for treatment or other patient management decisions. A negative result may occur with  improper specimen collection/handling, submission of specimen other than nasopharyngeal swab, presence of viral mutation(s) within the areas targeted by this assay, and inadequate number of viral copies(<138 copies/mL). A negative result must be combined with clinical observations, patient history, and epidemiological information. The expected result is Negative.  Fact Sheet for Patients:  BloggerCourse.com  Fact Sheet for Healthcare Providers:  SeriousBroker.it  This test is no t yet approved or cleared by the [place]  FDA and  has been authorized for detection and/or diagnosis of SARS-CoV-2 by FDA under an Emergency Use Authorization (EUA). This EUA will remain  in effect (meaning this test can be used) for the duration of the COVID-19 declaration under Section 564(b)(1) of the Act, 21 U.S.C.section 360bbb-3(b)(1), unless the authorization is terminated  or revoked sooner.       Influenza A by PCR NEGATIVE NEGATIVE Final   Influenza B by PCR NEGATIVE NEGATIVE Final    Comment: (NOTE) The Xpert Xpress SARS-CoV-2/FLU/RSV plus assay is intended as an aid in the diagnosis of influenza from Nasopharyngeal swab specimens and should not be used as a sole basis for treatment. Nasal washings and aspirates are unacceptable for Xpert Xpress SARS-CoV-2/FLU/RSV testing.  Fact Sheet for Patients: BloggerCourse.com  Fact Sheet for Healthcare Providers: SeriousBroker.it  This test is not yet approved or cleared by the [place]  FDA and has been authorized for detection and/or diagnosis of SARS-CoV-2  by FDA under an Emergency Use Authorization (EUA). This EUA will remain in effect (meaning this test can be used) for the duration of the COVID-19 declaration under Section 564(b)(1) of the Act, 21 U.S.C. section 360bbb-3(b)(1), unless the authorization is terminated or revoked.     Resp Syncytial Virus by PCR NEGATIVE NEGATIVE Final    Comment: (NOTE) Fact Sheet for Patients: BloggerCourse.com  Fact Sheet for Healthcare Providers: SeriousBroker.it  This test is not yet approved or cleared by the [place]  FDA and has been authorized for detection and/or diagnosis of SARS-CoV-2 by FDA under an Emergency Use Authorization (EUA). This EUA will remain in effect (meaning this test can be used) for the duration of the COVID-19 declaration under Section 564(b)(1) of the Act, 21 U.S.C. section 360bbb-3(b)(1),  unless the authorization is terminated or revoked.  Performed at [facility], [address] [place], [place] [address]   Surgical pcr screen     Status: None   Collection Time: 12/05/23  7:59 AM   Specimen: Nasal Mucosa; Nasal Swab  Result Value Ref Range Status   MRSA, PCR NEGATIVE NEGATIVE Final   Staphylococcus aureus NEGATIVE NEGATIVE Final    Comment: (NOTE) The Xpert SA Assay (FDA approved for NASAL specimens in patients 79 years of age and older), is one component of a comprehensive surveillance program. It is not intended to diagnose infection nor to guide or monitor treatment. Performed at [facility], [address] [place], [place] [address]     FURTHER DISCHARGE INSTRUCTIONS:   Get Medicines reviewed and adjusted: Please take all your medications with you for your next visit with your Primary MD   Laboratory/radiological data: Please request your Primary MD to go over all hospital tests and procedure/radiological results at the follow up, please ask your Primary MD to get all Hospital records sent  to his/her office.   In some cases, they will be blood work, cultures and biopsy results pending at the time of your discharge. Please request that your primary care M.D. goes through all the records of your hospital data and follows up on these results.   Also Note the following: If you experience worsening of your admission symptoms, develop shortness of breath, life threatening emergency, suicidal or homicidal thoughts you must seek medical attention immediately by calling 911 or calling your MD immediately  if symptoms less severe.   You must read complete instructions/literature along with all the possible adverse reactions/side effects for all the Medicines you take and that have been prescribed to you. Take any new Medicines after you have completely understood and accpet all the possible adverse reactions/side effects.    patient was instructed, not to drive, operate heavy machinery, perform activities at heights, swimming or participation in water activities or provide baby-sitting services while on Pain, Sleep and Anxiety Medications; until their outpatient Physician has advised to do so again. Also recommended to not to take more than prescribed Pain, Sleep and Anxiety Medications.  It is not advisable to combine anxiety, sleep and pain medications without talking with your primary care provider.     Wear Seat belts while driving.   Please note: You were cared for by a hospitalist during your hospital stay. Once you are discharged, your primary care physician will handle any further medical issues. Please note that NO REFILLS for any discharge medications will be authorized once you are discharged, as it is imperative that you return to your primary care physician (or establish a relationship with a primary care physician if you do not have one) for your post hospital discharge needs so that they can reassess your need for medications and monitor your lab values  Time coordinating  discharge: Over 30 minutes  SIGNED:   Fredia Skeeter, MD  [facility] 12/07/2023, 12:43 PM *Please note that this is a verbal dictation therefore any spelling or grammatical errors are due to the Dragon Medical One system interpretation. If 7PM-7AM, please contact night-coverage [email or web address]

## 2023-12-07 NOTE — Progress Notes (Signed)
 Called report to Friend, LPN at [facility].   Amado GORMAN Arabia, RN

## 2023-12-08 LAB — PATHOLOGIST SMEAR REVIEW

## 2023-12-13 ENCOUNTER — Inpatient Hospital Stay (HOSPITAL_COMMUNITY): Admit: 2023-12-13 | Admitting: Neurological Surgery
